# Patient Record
Sex: Female | Born: 1940 | Race: White | Hispanic: No | Smoking: Former smoker
Health system: Southern US, Community
[De-identification: ages and names within clinical notes are randomized; demographics above are authoritative.]

## PROBLEM LIST (undated history)

## (undated) DIAGNOSIS — E7800 Pure hypercholesterolemia, unspecified: Secondary | ICD-10-CM

## (undated) DIAGNOSIS — I509 Heart failure, unspecified: Secondary | ICD-10-CM

## (undated) DIAGNOSIS — F0390 Unspecified dementia without behavioral disturbance: Secondary | ICD-10-CM

## (undated) DIAGNOSIS — F419 Anxiety disorder, unspecified: Secondary | ICD-10-CM

## (undated) DIAGNOSIS — E0590 Thyrotoxicosis, unspecified without thyrotoxic crisis or storm: Secondary | ICD-10-CM

## (undated) DIAGNOSIS — M8580 Other specified disorders of bone density and structure, unspecified site: Secondary | ICD-10-CM

## (undated) DIAGNOSIS — M1990 Unspecified osteoarthritis, unspecified site: Secondary | ICD-10-CM

## (undated) DIAGNOSIS — J449 Chronic obstructive pulmonary disease, unspecified: Secondary | ICD-10-CM

## (undated) DIAGNOSIS — C801 Malignant (primary) neoplasm, unspecified: Secondary | ICD-10-CM

## (undated) HISTORY — DX: Pure hypercholesterolemia, unspecified: E78.00

## (undated) HISTORY — DX: Chronic obstructive pulmonary disease, unspecified: J44.9

## (undated) HISTORY — PX: ABDOMINAL HYSTERECTOMY: SHX81

## (undated) HISTORY — PX: BREAST SURGERY: SHX581

## (undated) HISTORY — PX: BACK SURGERY: SHX140

## (undated) HISTORY — PX: APPENDECTOMY: SHX54

## (undated) HISTORY — DX: Thyrotoxicosis, unspecified without thyrotoxic crisis or storm: E05.90

## (undated) HISTORY — DX: Other specified disorders of bone density and structure, unspecified site: M85.80

---

## 2013-06-07 ENCOUNTER — Ambulatory Visit: Payer: Medicare Other | Attending: Family Medicine

## 2013-06-07 DIAGNOSIS — IMO0001 Reserved for inherently not codable concepts without codable children: Secondary | ICD-10-CM | POA: Insufficient documentation

## 2013-06-07 DIAGNOSIS — I509 Heart failure, unspecified: Secondary | ICD-10-CM | POA: Insufficient documentation

## 2013-06-07 DIAGNOSIS — R262 Difficulty in walking, not elsewhere classified: Secondary | ICD-10-CM | POA: Insufficient documentation

## 2013-06-07 DIAGNOSIS — Z853 Personal history of malignant neoplasm of breast: Secondary | ICD-10-CM | POA: Insufficient documentation

## 2013-06-07 DIAGNOSIS — M216X9 Other acquired deformities of unspecified foot: Secondary | ICD-10-CM | POA: Insufficient documentation

## 2013-06-07 DIAGNOSIS — M6281 Muscle weakness (generalized): Secondary | ICD-10-CM | POA: Insufficient documentation

## 2013-06-07 DIAGNOSIS — M899 Disorder of bone, unspecified: Secondary | ICD-10-CM | POA: Insufficient documentation

## 2013-06-07 DIAGNOSIS — I10 Essential (primary) hypertension: Secondary | ICD-10-CM | POA: Insufficient documentation

## 2013-06-07 DIAGNOSIS — M949 Disorder of cartilage, unspecified: Secondary | ICD-10-CM

## 2013-06-14 ENCOUNTER — Ambulatory Visit: Payer: Medicare Other | Admitting: Physical Therapy

## 2013-06-16 ENCOUNTER — Encounter: Payer: Medicare Other | Admitting: Physical Therapy

## 2013-06-21 ENCOUNTER — Encounter: Payer: Medicare Other | Admitting: Physical Therapy

## 2013-06-21 ENCOUNTER — Ambulatory Visit: Payer: Medicare Other | Admitting: Physical Therapy

## 2013-06-23 ENCOUNTER — Encounter: Payer: Medicare Other | Admitting: Physical Therapy

## 2013-06-27 ENCOUNTER — Ambulatory Visit: Payer: Medicare Other

## 2013-06-28 ENCOUNTER — Encounter: Payer: Medicare Other | Admitting: Physical Therapy

## 2013-06-30 ENCOUNTER — Encounter: Payer: Medicare Other | Admitting: Physical Therapy

## 2013-07-05 ENCOUNTER — Encounter: Payer: Medicare Other | Admitting: Physical Therapy

## 2013-07-06 ENCOUNTER — Ambulatory Visit: Payer: Medicare Other | Admitting: Physical Therapy

## 2013-07-07 ENCOUNTER — Encounter: Payer: Medicare Other | Admitting: Physical Therapy

## 2013-07-07 ENCOUNTER — Encounter (HOSPITAL_COMMUNITY): Payer: Self-pay | Admitting: Emergency Medicine

## 2013-07-07 ENCOUNTER — Inpatient Hospital Stay (HOSPITAL_COMMUNITY)
Admission: EM | Admit: 2013-07-07 | Discharge: 2013-07-14 | DRG: 682 | Disposition: A | Discharge status: Home Health | Payer: Medicare Other | Attending: Family Medicine | Admitting: Family Medicine

## 2013-07-07 DIAGNOSIS — I959 Hypotension, unspecified: Secondary | ICD-10-CM | POA: Diagnosis present

## 2013-07-07 DIAGNOSIS — F0390 Unspecified dementia without behavioral disturbance: Secondary | ICD-10-CM | POA: Diagnosis present

## 2013-07-07 DIAGNOSIS — R197 Diarrhea, unspecified: Secondary | ICD-10-CM

## 2013-07-07 DIAGNOSIS — Z79899 Other long term (current) drug therapy: Secondary | ICD-10-CM

## 2013-07-07 DIAGNOSIS — Z9221 Personal history of antineoplastic chemotherapy: Secondary | ICD-10-CM

## 2013-07-07 DIAGNOSIS — I2699 Other pulmonary embolism without acute cor pulmonale: Secondary | ICD-10-CM

## 2013-07-07 DIAGNOSIS — N19 Unspecified kidney failure: Secondary | ICD-10-CM | POA: Insufficient documentation

## 2013-07-07 DIAGNOSIS — E872 Acidosis, unspecified: Secondary | ICD-10-CM

## 2013-07-07 DIAGNOSIS — J189 Pneumonia, unspecified organism: Secondary | ICD-10-CM | POA: Diagnosis not present

## 2013-07-07 DIAGNOSIS — M545 Low back pain, unspecified: Secondary | ICD-10-CM | POA: Diagnosis present

## 2013-07-07 DIAGNOSIS — G8929 Other chronic pain: Secondary | ICD-10-CM | POA: Diagnosis present

## 2013-07-07 DIAGNOSIS — N179 Acute kidney failure, unspecified: Principal | ICD-10-CM

## 2013-07-07 DIAGNOSIS — H532 Diplopia: Secondary | ICD-10-CM | POA: Diagnosis present

## 2013-07-07 DIAGNOSIS — E876 Hypokalemia: Secondary | ICD-10-CM

## 2013-07-07 DIAGNOSIS — D649 Anemia, unspecified: Secondary | ICD-10-CM

## 2013-07-07 DIAGNOSIS — Z87891 Personal history of nicotine dependence: Secondary | ICD-10-CM

## 2013-07-07 DIAGNOSIS — Z853 Personal history of malignant neoplasm of breast: Secondary | ICD-10-CM

## 2013-07-07 DIAGNOSIS — E860 Dehydration: Secondary | ICD-10-CM | POA: Diagnosis present

## 2013-07-07 DIAGNOSIS — I509 Heart failure, unspecified: Secondary | ICD-10-CM

## 2013-07-07 DIAGNOSIS — Z923 Personal history of irradiation: Secondary | ICD-10-CM

## 2013-07-07 DIAGNOSIS — J9600 Acute respiratory failure, unspecified whether with hypoxia or hypercapnia: Secondary | ICD-10-CM | POA: Diagnosis not present

## 2013-07-07 HISTORY — DX: Heart failure, unspecified: I50.9

## 2013-07-07 HISTORY — DX: Unspecified osteoarthritis, unspecified site: M19.90

## 2013-07-07 HISTORY — DX: Malignant (primary) neoplasm, unspecified: C80.1

## 2013-07-07 LAB — BASIC METABOLIC PANEL
BUN: 61 mg/dL — AB (ref 6–23)
CHLORIDE: 91 meq/L — AB (ref 96–112)
CO2: 17 meq/L — AB (ref 19–32)
Calcium: 8.8 mg/dL (ref 8.4–10.5)
Creatinine, Ser: 6.11 mg/dL — ABNORMAL HIGH (ref 0.50–1.10)
GFR calc Af Amer: 7 mL/min — ABNORMAL LOW (ref >90)
GFR calc non Af Amer: 6 mL/min — ABNORMAL LOW (ref >90)
GLUCOSE: 137 mg/dL — AB (ref 70–99)
POTASSIUM: 2.8 meq/L — AB (ref 3.7–5.3)
SODIUM: 134 meq/L — AB (ref 137–147)

## 2013-07-07 LAB — CBC
HEMATOCRIT: 32.7 % — AB (ref 36.0–46.0)
HEMOGLOBIN: 11.2 g/dL — AB (ref 12.0–15.0)
MCH: 29.2 pg (ref 26.0–34.0)
MCHC: 34.3 g/dL (ref 30.0–36.0)
MCV: 85.4 fL (ref 78.0–100.0)
Platelets: 325 10*3/uL (ref 150–400)
RBC: 3.83 MIL/uL — AB (ref 3.87–5.11)
RDW: 13.6 % (ref 11.5–15.5)
WBC: 9.4 10*3/uL (ref 4.0–10.5)

## 2013-07-07 LAB — POCT I-STAT, CHEM 8
BUN: 61 mg/dL — AB (ref 6–23)
CALCIUM ION: 1 mmol/L — AB (ref 1.13–1.30)
CREATININE: 6.3 mg/dL — AB (ref 0.50–1.10)
Chloride: 96 mEq/L (ref 96–112)
GLUCOSE: 126 mg/dL — AB (ref 70–99)
HCT: 35 % — ABNORMAL LOW (ref 36.0–46.0)
HEMOGLOBIN: 11.9 g/dL — AB (ref 12.0–15.0)
Potassium: 2.6 mEq/L — CL (ref 3.7–5.3)
Sodium: 132 mEq/L — ABNORMAL LOW (ref 137–147)
TCO2: 18 mmol/L (ref 0–100)

## 2013-07-07 MED ORDER — SODIUM CHLORIDE 0.9 % IV BOLUS (SEPSIS)
500.0000 mL | Freq: Once | INTRAVENOUS | Status: AC
  Administered 2013-07-07: 500 mL via INTRAVENOUS

## 2013-07-07 NOTE — ED Notes (Addendum)
Pt went to Dr. Today due to low BP at back specialist office. Pt reports Dr. Alroy Dust with [facility] called her and told her she needed to be admitted due to renal lab results. Dr. Orland Mustard of [facility] is her PCP. Pt was told labs were indicative of renal failure and had to be admitted to hospital. Pt is poor historian and just moved to [place]. Pt's tongue is dry but reports she has been eatting and drinking normally except today. Pt reports some lightheadedness.

## 2013-07-07 NOTE — ED Notes (Signed)
bp low. EDP [place] aware.

## 2013-07-07 NOTE — ED Provider Notes (Signed)
CSN: [id number]     Arrival date & time 07/07/13  2100 History   First MD Initiated Contact with Patient 07/07/13 2137     Chief Complaint  Patient presents with  . Hypotension     (Consider location/radiation/quality/duration/timing/severity/associated sxs/prior Treatment) Patient is a 73 y.o. female presenting with general illness.  Illness Quality:  Acute renal failure Severity:  Severe Onset quality:  Unable to specify Timing:  Constant Progression:  Unchanged Chronicity:  New Context:  73 yo female presenting after her PCPs office detected acute renal failure on blood tests performed today. Relieved by:  Nothing Associated symptoms: diarrhea (daily loose stools, chronic)   Associated symptoms: no abdominal pain, no chest pain, no congestion, no cough, no fever, no nausea, no shortness of breath and no vomiting   Associated symptoms comment:  Mild dizziness   Past Medical History  Diagnosis Date  . Cancer     breast CA 2002  . CHF (congestive heart failure)   . Arthritis    No past surgical history on file. No family history on file. History  Substance Use Topics  . Smoking status: Not on file  . Smokeless tobacco: Not on file  . Alcohol Use: Not on file   OB History   Grav Para Term Preterm Abortions TAB SAB Ect Mult Living                 Review of Systems  Constitutional: Negative for fever.  HENT: Negative for congestion.   Respiratory: Negative for cough and shortness of breath.   Cardiovascular: Negative for chest pain.  Gastrointestinal: Positive for diarrhea (daily loose stools, chronic). Negative for nausea, vomiting and abdominal pain.  All other systems reviewed and are negative.      Allergies  Review of patient's allergies indicates no known allergies.  Home Medications   Current Outpatient Rx  Name  Route  Sig  Dispense  Refill  . Calcium Citrate-Vitamin D (CALCITRATE/VITAMIN D PO)   Oral   Take 5 mLs by mouth daily.         .  carvedilol (COREG) 12.5 MG tablet   Oral   Take 12.5 mg by mouth 2 (two) times daily with a meal.         . furosemide (LASIX) 20 MG tablet   Oral   Take 20 mg by mouth.         . gabapentin (NEURONTIN) 300 MG capsule   Oral   Take 300 mg by mouth 3 (three) times daily.         Marland Kitchen lisinopril (PRINIVIL,ZESTRIL) 20 MG tablet   Oral   Take 20 mg by mouth daily.         . Multiple Vitamin (MULTIVITAMIN WITH MINERALS) TABS tablet   Oral   Take 2 tablets by mouth 3 (three) times daily.         . simvastatin (ZOCOR) 40 MG tablet   Oral   Take 40 mg by mouth daily at 6 PM.         . venlafaxine XR (EFFEXOR-XR) 150 MG 24 hr capsule   Oral   Take 150 mg by mouth daily with breakfast.          BP 74/40  Pulse 98  Temp(Src) 97.9 F (36.6 C) (Oral)  Resp 22  Ht 5' (1.524 m)  Wt 127 lb (57.607 kg)  BMI 24.80 kg/m2  SpO2 96% Physical Exam  Nursing note and vitals reviewed. Constitutional: She is oriented  to person, place, and time. She appears well-developed and well-nourished. No distress.  HENT:  Head: Normocephalic and atraumatic.  Mouth/Throat: Mucous membranes are dry.  Eyes: Conjunctivae are normal. Pupils are equal, round, and reactive to light. No scleral icterus.  Neck: Neck supple.  Cardiovascular: Normal rate, regular rhythm, normal heart sounds and intact distal pulses.   No murmur heard. Pulmonary/Chest: Effort normal. No stridor. No respiratory distress. She has rales (mild bibasilar).  Abdominal: Soft. Bowel sounds are normal. She exhibits no distension. There is no tenderness. There is no rebound and no guarding.  Musculoskeletal: Normal range of motion. She exhibits no edema.  Neurological: She is alert and oriented to person, place, and time.  Skin: Skin is warm and dry. No rash noted.  Psychiatric: She has a normal mood and affect. Her behavior is normal.    ED Course  CRITICAL CARE Performed by: Serita Grit DAVID III Authorized by:  Serita Grit DAVID III Total critical care time: 35 minutes Critical care time was exclusive of separately billable procedures and treating other patients. Critical care was necessary to treat or prevent imminent or life-threatening deterioration of the following conditions: circulatory failure and metabolic crisis. Critical care was time spent personally by me on the following activities: development of treatment plan with patient or surrogate, discussions with consultants, evaluation of patient's response to treatment, examination of patient, obtaining history from patient or surrogate, ordering and performing treatments and interventions, ordering and review of laboratory studies, ordering and review of radiographic studies, pulse oximetry, re-evaluation of patient's condition and review of old charts.   (including critical care time) Labs Review Labs Reviewed  BASIC METABOLIC PANEL - Abnormal; Notable for the following:    Sodium 134 (*)    Potassium 2.8 (*)    Chloride 91 (*)    CO2 17 (*)    Glucose, Bld 137 (*)    BUN 61 (*)    Creatinine, Ser 6.11 (*)    GFR calc non Af Amer 6 (*)    GFR calc Af Amer 7 (*)    All other components within normal limits  CBC - Abnormal; Notable for the following:    RBC 3.83 (*)    Hemoglobin 11.2 (*)    HCT 32.7 (*)    All other components within normal limits  POCT I-STAT, CHEM 8 - Abnormal; Notable for the following:    Sodium 132 (*)    Potassium 2.6 (*)    BUN 61 (*)    Creatinine, Ser 6.30 (*)    Glucose, Bld 126 (*)    Calcium, Ion 1.00 (*)    Hemoglobin 11.9 (*)    HCT 35.0 (*)    All other components within normal limits  URINALYSIS, ROUTINE W REFLEX MICROSCOPIC   Imaging Review No results found.  EKG Interpretation    Date/Time:  Thursday July 07 2013 21:49:08 EST Ventricular Rate:  95 PR Interval:  138 QRS Duration: 106 QT Interval:  463 QTC Calculation: 582 R Axis:   84 Text Interpretation:  Sinus rhythm  Borderline right axis deviation Prolonged QT interval No old tracing to compare Confirmed by [facility]  MD, TREY ([id number]) on 07/07/2013 10:55:51 PM            MDM   Final diagnoses:  Acute renal failure  Hypokalemia    73 yo female presenting with acute renal failure diagnosed at her PCP's office today.  She reports going to see her orthopedist, who sent her to her  PCP due to low blood pressure.  Her PCP's office initially discharged her home, but called her when her bloodwork indicated renal failure.  On arrival, she was in no distress, was nontoxic, was mentating well, but had SBPs in the 80's.  Her son reports that she has chronically low BPs, but we have no records to confirm this.   She denies any significant symptoms recently to explain her renal failure.   Have spoken with Dr. Hal Hope about admission.  Meanwhile, her BPs fell to the 70's.  Will give more IV fluids and evaluate for response.    Recheck of BP showed pressures in 80's, similar to her reported baseline.  Plan admission to stepdown unit.    Houston Siren III, MD 07/08/13 [phone]

## 2013-07-07 NOTE — ED Notes (Signed)
Follow up to EDP - BP is pts. Baseline.

## 2013-07-08 ENCOUNTER — Inpatient Hospital Stay (HOSPITAL_COMMUNITY): Payer: Medicare Other

## 2013-07-08 ENCOUNTER — Encounter (HOSPITAL_COMMUNITY): Payer: Self-pay | Admitting: Internal Medicine

## 2013-07-08 DIAGNOSIS — E876 Hypokalemia: Secondary | ICD-10-CM | POA: Diagnosis present

## 2013-07-08 DIAGNOSIS — R197 Diarrhea, unspecified: Secondary | ICD-10-CM

## 2013-07-08 DIAGNOSIS — N179 Acute kidney failure, unspecified: Secondary | ICD-10-CM | POA: Diagnosis present

## 2013-07-08 DIAGNOSIS — Z853 Personal history of malignant neoplasm of breast: Secondary | ICD-10-CM

## 2013-07-08 DIAGNOSIS — I369 Nonrheumatic tricuspid valve disorder, unspecified: Secondary | ICD-10-CM

## 2013-07-08 DIAGNOSIS — I509 Heart failure, unspecified: Secondary | ICD-10-CM | POA: Diagnosis present

## 2013-07-08 DIAGNOSIS — E872 Acidosis, unspecified: Secondary | ICD-10-CM | POA: Diagnosis present

## 2013-07-08 DIAGNOSIS — D649 Anemia, unspecified: Secondary | ICD-10-CM

## 2013-07-08 LAB — URINALYSIS, ROUTINE W REFLEX MICROSCOPIC
Bilirubin Urine: NEGATIVE
GLUCOSE, UA: NEGATIVE mg/dL
HGB URINE DIPSTICK: NEGATIVE
KETONES UR: 15 mg/dL — AB
LEUKOCYTES UA: NEGATIVE
Nitrite: NEGATIVE
PROTEIN: 30 mg/dL — AB
Specific Gravity, Urine: 1.023 (ref 1.005–1.030)
Urobilinogen, UA: 0.2 mg/dL (ref 0.0–1.0)
pH: 5 (ref 5.0–8.0)

## 2013-07-08 LAB — POCT I-STAT 3, ART BLOOD GAS (G3+)
ACID-BASE DEFICIT: 7 mmol/L — AB (ref 0.0–2.0)
Bicarbonate: 18.7 mEq/L — ABNORMAL LOW (ref 20.0–24.0)
O2 Saturation: 89 %
PH ART: 7.296 — AB (ref 7.350–7.450)
PO2 ART: 60 mmHg — AB (ref 80.0–100.0)
Patient temperature: 97.9
TCO2: 20 mmol/L (ref 0–100)
pCO2 arterial: 38.1 mmHg (ref 35.0–45.0)

## 2013-07-08 LAB — COMPREHENSIVE METABOLIC PANEL
ALK PHOS: 90 U/L (ref 39–117)
ALT: 10 U/L (ref 0–35)
AST: 13 U/L (ref 0–37)
Albumin: 3 g/dL — ABNORMAL LOW (ref 3.5–5.2)
BUN: 53 mg/dL — ABNORMAL HIGH (ref 6–23)
CALCIUM: 7.9 mg/dL — AB (ref 8.4–10.5)
CHLORIDE: 101 meq/L (ref 96–112)
CO2: 19 meq/L (ref 19–32)
Creatinine, Ser: 4.21 mg/dL — ABNORMAL HIGH (ref 0.50–1.10)
GFR calc Af Amer: 11 mL/min — ABNORMAL LOW (ref >90)
GFR, EST NON AFRICAN AMERICAN: 10 mL/min — AB (ref >90)
Glucose, Bld: 120 mg/dL — ABNORMAL HIGH (ref 70–99)
Potassium: 3.1 mEq/L — ABNORMAL LOW (ref 3.7–5.3)
SODIUM: 137 meq/L (ref 137–147)
Total Protein: 6.2 g/dL (ref 6.0–8.3)

## 2013-07-08 LAB — POCT I-STAT, CHEM 8
BUN: 65 mg/dL — ABNORMAL HIGH (ref 6–23)
Calcium, Ion: 1.11 mmol/L — ABNORMAL LOW (ref 1.13–1.30)
Chloride: 96 mEq/L (ref 96–112)
Creatinine, Ser: 6.8 mg/dL — ABNORMAL HIGH (ref 0.50–1.10)
Glucose, Bld: 137 mg/dL — ABNORMAL HIGH (ref 70–99)
HEMATOCRIT: 36 % (ref 36.0–46.0)
HEMOGLOBIN: 12.2 g/dL (ref 12.0–15.0)
POTASSIUM: 2.5 meq/L — AB (ref 3.7–5.3)
Sodium: 133 mEq/L — ABNORMAL LOW (ref 137–147)
TCO2: 21 mmol/L (ref 0–100)

## 2013-07-08 LAB — FERRITIN: Ferritin: 64 ng/mL (ref 10–291)

## 2013-07-08 LAB — LACTIC ACID, PLASMA: Lactic Acid, Venous: 1 mmol/L (ref 0.5–2.2)

## 2013-07-08 LAB — URINE MICROSCOPIC-ADD ON

## 2013-07-08 LAB — CBC WITH DIFFERENTIAL/PLATELET
BASOS PCT: 0 % (ref 0–1)
Basophils Absolute: 0 10*3/uL (ref 0.0–0.1)
EOS ABS: 0.2 10*3/uL (ref 0.0–0.7)
Eosinophils Relative: 2 % (ref 0–5)
HCT: 31.3 % — ABNORMAL LOW (ref 36.0–46.0)
HEMOGLOBIN: 10.5 g/dL — AB (ref 12.0–15.0)
Lymphocytes Relative: 19 % (ref 12–46)
Lymphs Abs: 1.5 10*3/uL (ref 0.7–4.0)
MCH: 28.7 pg (ref 26.0–34.0)
MCHC: 33.5 g/dL (ref 30.0–36.0)
MCV: 85.5 fL (ref 78.0–100.0)
MONOS PCT: 11 % (ref 3–12)
Monocytes Absolute: 0.9 10*3/uL (ref 0.1–1.0)
NEUTROS PCT: 68 % (ref 43–77)
Neutro Abs: 5.5 10*3/uL (ref 1.7–7.7)
Platelets: 275 10*3/uL (ref 150–400)
RBC: 3.66 MIL/uL — ABNORMAL LOW (ref 3.87–5.11)
RDW: 13.4 % (ref 11.5–15.5)
WBC: 8.2 10*3/uL (ref 4.0–10.5)

## 2013-07-08 LAB — IRON AND TIBC
IRON: 34 ug/dL — AB (ref 42–135)
Saturation Ratios: 13 % — ABNORMAL LOW (ref 20–55)
TIBC: 262 ug/dL (ref 250–470)
UIBC: 228 ug/dL (ref 125–400)

## 2013-07-08 LAB — RETICULOCYTES
RBC.: 3.71 MIL/uL — ABNORMAL LOW (ref 3.87–5.11)
RETIC COUNT ABSOLUTE: 22.3 10*3/uL (ref 19.0–186.0)
Retic Ct Pct: 0.6 % (ref 0.4–3.1)

## 2013-07-08 LAB — MRSA PCR SCREENING: MRSA BY PCR: NEGATIVE

## 2013-07-08 LAB — FOLATE

## 2013-07-08 LAB — CLOSTRIDIUM DIFFICILE BY PCR: Toxigenic C. Difficile by PCR: NEGATIVE

## 2013-07-08 LAB — TSH: TSH: 2.612 u[IU]/mL (ref 0.350–4.500)

## 2013-07-08 LAB — CK: Total CK: 82 U/L (ref 7–177)

## 2013-07-08 LAB — TROPONIN I: Troponin I: <0.3 ng/mL (ref <0.30)

## 2013-07-08 LAB — OCCULT BLOOD, POC DEVICE: FECAL OCCULT BLD: NEGATIVE

## 2013-07-08 LAB — VITAMIN B12

## 2013-07-08 MED ORDER — ACETAMINOPHEN 650 MG RE SUPP: 650.0000 mg | Freq: Four times a day (QID) | RECTAL | Status: DC | PRN

## 2013-07-08 MED ORDER — SODIUM CHLORIDE 0.9 % IJ SOLN
3.0000 mL | Freq: Two times a day (BID) | INTRAMUSCULAR | Status: DC
  Administered 2013-07-08 – 2013-07-13 (×10): 3 mL via INTRAVENOUS

## 2013-07-08 MED ORDER — SODIUM CHLORIDE 0.9 % IV SOLN
INTRAVENOUS | Status: DC
Start: 2013-07-08 — End: 2013-07-09
  Administered 2013-07-08: 22:00:00 via INTRAVENOUS

## 2013-07-08 MED ORDER — DIPHENOXYLATE-ATROPINE 2.5-0.025 MG PO TABS
2.0000 | ORAL_TABLET | Freq: Four times a day (QID) | ORAL | Status: DC | PRN
  Administered 2013-07-08: 2 via ORAL
  Filled 2013-07-08: qty 2

## 2013-07-08 MED ORDER — GABAPENTIN 100 MG PO CAPS
100.0000 mg | ORAL_CAPSULE | Freq: Three times a day (TID) | ORAL | Status: DC
  Administered 2013-07-08 – 2013-07-14 (×18): 100 mg via ORAL
  Filled 2013-07-08 (×20): qty 1

## 2013-07-08 MED ORDER — POTASSIUM CHLORIDE CRYS ER 20 MEQ PO TBCR
40.0000 meq | EXTENDED_RELEASE_TABLET | Freq: Two times a day (BID) | ORAL | Status: AC
  Administered 2013-07-08 (×2): 40 meq via ORAL
  Filled 2013-07-08 (×2): qty 2

## 2013-07-08 MED ORDER — ASPIRIN 81 MG PO CHEW
81.0000 mg | CHEWABLE_TABLET | Freq: Every day | ORAL | Status: DC
  Administered 2013-07-08 – 2013-07-11 (×4): 81 mg via ORAL
  Filled 2013-07-08 (×4): qty 1

## 2013-07-08 MED ORDER — ONDANSETRON HCL 4 MG PO TABS: 4.0000 mg | ORAL_TABLET | Freq: Four times a day (QID) | ORAL | Status: DC | PRN

## 2013-07-08 MED ORDER — SODIUM CHLORIDE 0.9 % IV BOLUS (SEPSIS)
500.0000 mL | Freq: Once | INTRAVENOUS | Status: AC
  Administered 2013-07-08: 500 mL via INTRAVENOUS

## 2013-07-08 MED ORDER — VENLAFAXINE HCL ER 150 MG PO CP24
150.0000 mg | ORAL_CAPSULE | Freq: Every day | ORAL | Status: DC
  Administered 2013-07-09 – 2013-07-14 (×6): 150 mg via ORAL
  Filled 2013-07-08 (×8): qty 1

## 2013-07-08 MED ORDER — SODIUM CHLORIDE 0.9 % IV SOLN
INTRAVENOUS | Status: DC
  Administered 2013-07-08 (×3): via INTRAVENOUS

## 2013-07-08 MED ORDER — SODIUM CHLORIDE 0.9 % IV BOLUS (SEPSIS)
1000.0000 mL | Freq: Once | INTRAVENOUS | Status: AC
  Administered 2013-07-08: 1000 mL via INTRAVENOUS

## 2013-07-08 MED ORDER — MAGNESIUM SULFATE IN D5W 10-5 MG/ML-% IV SOLN
1.0000 g | Freq: Once | INTRAVENOUS | Status: AC
  Administered 2013-07-08: 1 g via INTRAVENOUS
  Filled 2013-07-08: qty 100

## 2013-07-08 MED ORDER — POTASSIUM CHLORIDE CRYS ER 20 MEQ PO TBCR
20.0000 meq | EXTENDED_RELEASE_TABLET | Freq: Once | ORAL | Status: AC
  Administered 2013-07-08: 20 meq via ORAL
  Filled 2013-07-08: qty 1

## 2013-07-08 MED ORDER — ACETAMINOPHEN 325 MG PO TABS
650.0000 mg | ORAL_TABLET | Freq: Four times a day (QID) | ORAL | Status: DC | PRN
  Administered 2013-07-08 – 2013-07-12 (×4): 650 mg via ORAL
  Filled 2013-07-08 (×6): qty 2

## 2013-07-08 MED ORDER — ONDANSETRON HCL 4 MG/2ML IJ SOLN: 4.0000 mg | Freq: Four times a day (QID) | INTRAMUSCULAR | Status: DC | PRN

## 2013-07-08 MED ORDER — SODIUM CHLORIDE 0.9 % IV BOLUS (SEPSIS)
1000.0000 mL | INTRAVENOUS | Status: DC | PRN
Start: 2013-07-08 — End: 2013-07-14

## 2013-07-08 MED ORDER — SODIUM CHLORIDE 0.9 % IV SOLN
INTRAVENOUS | Status: DC
  Administered 2013-07-08: 11:00:00 via INTRAVENOUS

## 2013-07-08 MED ORDER — HEPARIN SODIUM (PORCINE) 5000 UNIT/ML IJ SOLN
5000.0000 [IU] | Freq: Three times a day (TID) | INTRAMUSCULAR | Status: DC
  Administered 2013-07-08 – 2013-07-09 (×3): 5000 [IU] via SUBCUTANEOUS
  Filled 2013-07-08 (×6): qty 1

## 2013-07-08 NOTE — Progress Notes (Signed)
  Echocardiogram 2D Echocardiogram has been performed.  Mauricio Po 07/08/2013, 5:43 PM

## 2013-07-08 NOTE — Progress Notes (Signed)
Report obtained on pt at this time.

## 2013-07-08 NOTE — Progress Notes (Signed)
Patient Demographics  [name], is a 73 y.o. female, DOB - 05-15-1941, [id number]  Admit date - 07/07/2013   Admitting Physician [name], MD  Outpatient Primary MD for the patient is [name], MD  LOS - 1   Chief Complaint  Patient presents with  . Hypotension        Assessment & Plan    1. Severe dehydration, acute renal failure along with metabolic acidosis likely secondary to prerenal ischemia caused by combination of Lasix, Coreg and ACE inhibitor. Renal ultrasound is stable, patient's renal function has improved considerably after IV fluids, continue to hold offending medications continue to supplement fluids, monitor renal function and electrolytes closely.    2. Hypotension due to #1 above. Afebrile, no leukocytosis, UA and chest x-ray clear, stable lactic acid level, supportive care as above along with IV fluid boluses if needed.    3. History of breast cancer. No acute issues outpatient monitor.    4. Chronic nonspecific CHF. No echogram in chart. Currently compensated, in fact dehydrated, blood pressure on the softer side, beta blocker ACE inhibitor held, IV fluids.    5. Chr. diarrhea. Rule out C. Difficile.       Code Status: Full  Family Communication:    Disposition Plan: TBD   Procedures Renal US - stable   Consults     Medications  Scheduled Meds: . magnesium sulfate 1 - 4 g bolus IVPB  1 g Intravenous Once  . potassium chloride  40 mEq Oral BID  . sodium chloride  3 mL Intravenous Q12H  . [START ON 07/09/2013] venlafaxine XR  150 mg Oral Q breakfast   Continuous Infusions: . sodium chloride     PRN Meds:.acetaminophen, ondansetron (ZOFRAN) IV, sodium chloride  DVT Prophylaxis   Heparin    Lab Results  Component Value  Date   PLT 275 07/08/2013    Antibiotics     Anti-infectives   None          Subjective:   [name] today has, No headache, No chest pain, No abdominal pain - No Nausea, No new weakness tingling or numbness, No Cough - SOB.   Objective:   Filed Vitals:   07/08/13 0815 07/08/13 0830 07/08/13 0845 07/08/13 0900  BP: 88/50 93/43 88/51  96/50  Pulse: 94 94 92 92  Temp:      TempSrc:      Resp: 12 18 15 14   Height:      Weight:      SpO2: 94% 96% 100% 98%    Wt Readings from Last 3 Encounters:  07/07/13 57.607 kg (127 lb)     Intake/Output Summary (Last 24 hours) at 07/08/13 1115 Last data filed at 07/08/13 0706  Gross per 24 hour  Intake   1500 ml  Output    450 ml  Net   1050 ml     Physical Exam  Awake  Oriented X 1, No new F.N deficits, Normal affect [place].AT,PERRAL Supple Neck,No JVD, No cervical lymphadenopathy appriciated.  Symmetrical Chest wall movement, Good air movement bilaterally, CTAB RRR,No Gallops,Rubs or new Murmurs, No Parasternal Heave +ve B.Sounds, Abd Soft, Non tender, No organomegaly appriciated, No rebound - guarding or rigidity. No Cyanosis, Clubbing or  edema, No new Rash or bruise      Data Review   Micro Results No results found for this or any previous visit (from the past 240 hour(s)).  Radiology Reports US Renal  07/08/2013   CLINICAL DATA:  Acute renal failure.  EXAM: RENAL/URINARY TRACT ULTRASOUND COMPLETE  COMPARISON:  None.  FINDINGS: Right Kidney:  Length: 10.5. Echogenicity within normal limits. No mass or hydronephrosis visualized.  Left Kidney:  Length: 9.7. Echogenicity within normal limits. No mass or hydronephrosis visualized.  Bladder:  The bladder was not visualized.  IMPRESSION: Normal kidneys.   Electronically Signed   By: Rozetta Nunnery M.D.   On: 07/08/2013 06:52   Dg Chest Port 1 View  07/08/2013   CLINICAL DATA:  Low blood pressure, weakness  EXAM: PORTABLE CHEST - 1 VIEW  COMPARISON:  None.  FINDINGS: The  heart size and mediastinal contours are within normal limits. There is no focal infiltrate, pulmonary edema, or pleural effusion. 7 mm calcified granuloma is identified in the medial right upper lobe. The visualized skeletal structures are unremarkable.  IMPRESSION: No active cardiopulmonary disease.   Electronically Signed   By: Abelardo Diesel M.D.   On: 07/08/2013 01:05    CBC  Recent Labs Lab 07/07/13 2132 07/07/13 2153 07/08/13 0521  WBC 9.4  --  8.2  HGB 11.2* 11.9* 10.5*  HCT 32.7* 35.0* 31.3*  PLT 325  --  275  MCV 85.4  --  85.5  MCH 29.2  --  28.7  MCHC 34.3  --  33.5  RDW 13.6  --  13.4  LYMPHSABS  --   --  1.5  MONOABS  --   --  0.9  EOSABS  --   --  0.2  BASOSABS  --   --  0.0    Chemistries   Recent Labs Lab 07/07/13 2153 07/07/13 2220 07/08/13 0521  NA 132* 134* 137  K 2.6* 2.8* 3.1*  CL 96 91* 101  CO2  --  17* 19  GLUCOSE 126* 137* 120*  BUN 61* 61* 53*  CREATININE 6.30* 6.11* 4.21*  CALCIUM  --  8.8 7.9*  AST  --   --  13  ALT  --   --  10  ALKPHOS  --   --  90  BILITOT  --   --  <0.2*   ------------------------------------------------------------------------------------------------------------------ estimated creatinine clearance is 9.6 ml/min (by C-G formula based on Cr of 4.21). ------------------------------------------------------------------------------------------------------------------ No results found for this basename: HGBA1C,  in the last 72 hours ------------------------------------------------------------------------------------------------------------------ No results found for this basename: CHOL, HDL, LDLCALC, TRIG, CHOLHDL, LDLDIRECT,  in the last 72 hours ------------------------------------------------------------------------------------------------------------------ No results found for this basename: TSH, T4TOTAL, FREET3, T3FREE, THYROIDAB,  in the last 72  hours ------------------------------------------------------------------------------------------------------------------ No results found for this basename: VITAMINB12, FOLATE, FERRITIN, TIBC, IRON, RETICCTPCT,  in the last 72 hours  Coagulation profile No results found for this basename: INR, PROTIME,  in the last 168 hours  No results found for this basename: DDIMER,  in the last 72 hours  Cardiac Enzymes  Recent Labs Lab 07/08/13 0203  TROPONINI <0.30   ------------------------------------------------------------------------------------------------------------------ No components found with this basename: POCBNP,      Time Spent in minutes   35   Lala Lund K M.D on 07/08/2013 at 11:15 AM  Between 7am to 7pm - Pager - [phone]  After 7pm go to [email or web address] - password [email or web address]  And look for the night coverage person covering for me after hours  [facility] Office  [phone]

## 2013-07-08 NOTE — ED Notes (Signed)
MD at bedside.  Admitting Dr. At bedside.

## 2013-07-08 NOTE — ED Notes (Signed)
Pt had 2 loose bowel movements

## 2013-07-08 NOTE — ED Notes (Signed)
Admitting MD aware of current status.

## 2013-07-08 NOTE — H&P (Addendum)
[facility] History and Physical  [name] [id number] DOB: 08-Nov-1940 DOA: 07/07/2013  Referring physician: ER physician. PCP: [name], MD   Chief Complaint: Was referred to the ER because of abnormal labs.  HPI: [name] is a 73 y.o. female history of breast cancer status post lumpectomy chemotherapy and radiation in 2002 following which patient had developed chemotherapy related CHF and is on lisinopril and Lasix and Coreg had gone to her orthopedic appointment yesterday and was found to have on record about blood pressure and was advised to go to her PCP where blood test was done and later patient's PCP called patient to go to the ER because her abnormal labs. In the ER labs were done and shows creatinine of 6 with metabolic acidosis and hypokalemia and anemia. Patient also was found to be hypotensive with blood pressure in the 06Y systolic. Patient otherwise denies any chest pain shortness of breath nausea vomiting abdominal pain fever chills or any loss of consciousness dizziness. Patient states that she has been having diarrhea for one time now. She has chronic low back pain has had surgery last year. Denies having taken any new medications recently and has not taken any NSAIDs for pain. Denies having noticed any blood in the stools. Patient was given 500 cc bolus normal saline twice and blood pressure improves with boluses. At this time urine studies and renal sonogram and ABG are pending. Patient will be admitted for further management. Patient states she has not taken her usual dose of Lasix yesterday and has not made much urine since yesterday morning.  Review of Systems: As presented in the history of presenting illness, rest negative.  Past Medical History  Diagnosis Date  . Cancer     breast CA 2002  . CHF (congestive heart failure)   . Arthritis    Past Surgical History  Procedure Laterality Date  . Abdominal hysterectomy    . Appendectomy    . Breast  surgery    . Back surgery     Social History:  reports that she has quit smoking. She does not have any smokeless tobacco history on file. She reports that she does not drink alcohol or use illicit drugs. Where does patient live at assisted living. Can patient participate in ADLs? Yes.  No Known Allergies  Family History:  Family History  Problem Relation Age of Onset  . Stroke Father   . Diabetes Mellitus I Son   . Leukemia Son       Prior to Admission medications   Medication Sig Start Date End Date Taking? Authorizing Provider  Calcium Citrate-Vitamin D (CALCITRATE/VITAMIN D PO) Take 5 mLs by mouth daily.   Yes Historical Provider, MD  carvedilol (COREG) 12.5 MG tablet Take 12.5 mg by mouth 2 (two) times daily with a meal.   Yes Historical Provider, MD  furosemide (LASIX) 20 MG tablet Take 20 mg by mouth.   Yes Historical Provider, MD  gabapentin (NEURONTIN) 300 MG capsule Take 300 mg by mouth 3 (three) times daily.   Yes Historical Provider, MD  lisinopril (PRINIVIL,ZESTRIL) 20 MG tablet Take 20 mg by mouth daily.   Yes Historical Provider, MD  Multiple Vitamin (MULTIVITAMIN WITH MINERALS) TABS tablet Take 2 tablets by mouth 3 (three) times daily.   Yes Historical Provider, MD  simvastatin (ZOCOR) 40 MG tablet Take 40 mg by mouth daily at 6 PM.   Yes Historical Provider, MD  venlafaxine XR (EFFEXOR-XR) 150 MG 24 hr capsule Take 150 mg  by mouth daily with breakfast.   Yes Historical Provider, MD    Physical Exam: Filed Vitals:   07/07/13 2315 07/07/13 2329 07/07/13 2345 07/08/13 0000  BP: 71/38 71/38 86/47  81/41  Pulse: 90 90 93 95  Temp:      TempSrc:      Resp: 20 16 19 17   Height:      Weight:      SpO2: 97% 96% 100% 98%     General:  Well developed and moderately nourished.  Eyes: Pallor present. Anicteric.  ENT: No discharge from the ears eyes nose mouth.  Neck: No mass felt. No JVD.  Cardiovascular: S1-S2 heard.  Respiratory: No rhonchi or  crepitations.  Abdomen: Soft nontender bowel sounds present. No guarding no rigidity.  Skin: No rash.  Musculoskeletal: No edema. Has developed a small bump on the anterior shin after wearing brace.  Psychiatric: Appears normal.  Neurologic: Alert awake oriented to time place and person. Moves all extremities. Has right foot drop.  Labs on Admission:  Basic Metabolic Panel:  Recent Labs Lab 07/07/13 2153 07/07/13 2220  NA 132* 134*  K 2.6* 2.8*  CL 96 91*  CO2  --  17*  GLUCOSE 126* 137*  BUN 61* 61*  CREATININE 6.30* 6.11*  CALCIUM  --  8.8   Liver Function Tests: No results found for this basename: AST, ALT, ALKPHOS, BILITOT, PROT, ALBUMIN,  in the last 168 hours No results found for this basename: LIPASE, AMYLASE,  in the last 168 hours No results found for this basename: AMMONIA,  in the last 168 hours CBC:  Recent Labs Lab 07/07/13 2132 07/07/13 2153  WBC 9.4  --   HGB 11.2* 11.9*  HCT 32.7* 35.0*  MCV 85.4  --   PLT 325  --    Cardiac Enzymes: No results found for this basename: CKTOTAL, CKMB, CKMBINDEX, TROPONINI,  in the last 168 hours  BNP (last 3 results) No results found for this basename: PROBNP,  in the last 8760 hours CBG: No results found for this basename: GLUCAP,  in the last 168 hours  Radiological Exams on Admission: Dg Chest Port 1 View  07/08/2013   CLINICAL DATA:  Low blood pressure, weakness  EXAM: PORTABLE CHEST - 1 VIEW  COMPARISON:  None.  FINDINGS: The heart size and mediastinal contours are within normal limits. There is no focal infiltrate, pulmonary edema, or pleural effusion. 7 mm calcified granuloma is identified in the medial right upper lobe. The visualized skeletal structures are unremarkable.  IMPRESSION: No active cardiopulmonary disease.   Electronically Signed   By: Abelardo Diesel M.D.   On: 07/08/2013 01:05    EKG: Independently reviewed. Normal sinus rhythm with prolonged QTC.  Assessment/Plan Principal Problem:    Acute renal failure Active Problems:   Metabolic acidosis   Hypokalemia   Anemia   History of breast cancer   CHF (congestive heart failure)   Diarrhea   1. Acute renal failure -  Cause at this time is not clear. No old labs to compare. The renal sonogram is pending to check for any obstruction. Urine studies are also pending including urine analysis, urine sodium and creatinine. Since patient has anemia I have also ordered SPEP and UPEP. Check CK and troponins. At this time patient is still hypotensive so I have ordered another liter normal saline bolus. I will hold off Lasix lisinopril and Coreg at this time due to hypotension. Check ABG since patient has metabolic acidosis probably from  renal failure. Closely follow intake output and metabolic panel. Patient is yet to give a sample of urine. Further recommendations based on clinical course and test ordered. Since patient has history of CHF closely follow her respiratory status. Patient does have diarrhea which could be contributing to the renal failure with hypotension further worsening along with patient's antihypertensives. 2. Hypotension - patient does not look septic. For now patient is responding to fluids. I have ordered one more liter normal saline bolus and will place patient on normal saline infusion. Since patient has history of CHF closely follow her respiratory status intake output and metabolic panel. 3. Metabolic acidosis - probably from #1 reason. Check ABG and if very acidotic we'll start bicarbonate infusion. 4. Anemia - check anemia panel, SPEP UPEP and stool for occult blood. Closely follow CBC. 5. Diarrhea - patient states that she's been having diarrhea for last few months. Denies having taken any antibiotics recently. Check stool for culture and C. difficile. Patient abdomen appears benign. Denies any nausea vomiting or abdominal pain. 6. 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 Holding off lisinopril Lasix and Coreg due to hypotension and renal failure. 7. Chronic low back pain - patient states she doesn't take any pain medications. For now I have Patient on Tylenol. LFTs are pending.    Code Status:  full code.  Family Communication:  none.  Disposition Plan:  admit to inpatient.    [name] N. [facility] Pager [phone].  If 7PM-7AM, please contact night-coverage [email or web address] Password [facility] 07/08/2013, 1:38 AM

## 2013-07-09 ENCOUNTER — Other Ambulatory Visit: Payer: Self-pay

## 2013-07-09 ENCOUNTER — Inpatient Hospital Stay (HOSPITAL_COMMUNITY): Payer: Medicare Other

## 2013-07-09 DIAGNOSIS — I2699 Other pulmonary embolism without acute cor pulmonale: Secondary | ICD-10-CM

## 2013-07-09 LAB — MAGNESIUM: Magnesium: 2.3 mg/dL (ref 1.5–2.5)

## 2013-07-09 LAB — CBC
HEMATOCRIT: 29 % — AB (ref 36.0–46.0)
Hemoglobin: 9.9 g/dL — ABNORMAL LOW (ref 12.0–15.0)
MCH: 29.4 pg (ref 26.0–34.0)
MCHC: 34.1 g/dL (ref 30.0–36.0)
MCV: 86.1 fL (ref 78.0–100.0)
Platelets: 244 10*3/uL (ref 150–400)
RBC: 3.37 MIL/uL — AB (ref 3.87–5.11)
RDW: 13.8 % (ref 11.5–15.5)
WBC: 5.3 10*3/uL (ref 4.0–10.5)

## 2013-07-09 LAB — BASIC METABOLIC PANEL
BUN: 35 mg/dL — ABNORMAL HIGH (ref 6–23)
CHLORIDE: 115 meq/L — AB (ref 96–112)
CO2: 16 mEq/L — ABNORMAL LOW (ref 19–32)
CREATININE: 1.07 mg/dL (ref 0.50–1.10)
Calcium: 8 mg/dL — ABNORMAL LOW (ref 8.4–10.5)
GFR, EST AFRICAN AMERICAN: 59 mL/min — AB (ref >90)
GFR, EST NON AFRICAN AMERICAN: 51 mL/min — AB (ref >90)
Glucose, Bld: 85 mg/dL (ref 70–99)
POTASSIUM: 4.1 meq/L (ref 3.7–5.3)
Sodium: 144 mEq/L (ref 137–147)

## 2013-07-09 LAB — TROPONIN I: Troponin I: <0.3 ng/mL (ref <0.30)

## 2013-07-09 MED ORDER — LACTATED RINGERS IV SOLN
INTRAVENOUS | Status: AC
  Administered 2013-07-09: 1000 mL via INTRAVENOUS

## 2013-07-09 MED ORDER — RIVAROXABAN 15 MG PO TABS
15.0000 mg | ORAL_TABLET | Freq: Two times a day (BID) | ORAL | Status: DC
  Administered 2013-07-09 – 2013-07-14 (×11): 15 mg via ORAL
  Filled 2013-07-09 (×14): qty 1

## 2013-07-09 MED ORDER — METOPROLOL TARTRATE 1 MG/ML IV SOLN: 5.0000 mg | INTRAVENOUS | Status: DC | PRN

## 2013-07-09 MED ORDER — NITROGLYCERIN 0.4 MG SL SUBL: 0.4000 mg | SUBLINGUAL_TABLET | SUBLINGUAL | Status: DC | PRN

## 2013-07-09 MED ORDER — NITROGLYCERIN 0.4 MG SL SUBL
SUBLINGUAL_TABLET | SUBLINGUAL | Status: AC
  Filled 2013-07-09: qty 62.5

## 2013-07-09 MED ORDER — RIVAROXABAN 15 MG PO TABS
15.0000 mg | ORAL_TABLET | Freq: Two times a day (BID) | ORAL | Status: DC
Start: 2013-07-09 — End: 2013-07-09
  Filled 2013-07-09 (×2): qty 1

## 2013-07-09 MED ORDER — ACETAMINOPHEN 325 MG PO TABS
650.0000 mg | ORAL_TABLET | Freq: Four times a day (QID) | ORAL | Status: AC
  Administered 2013-07-09 – 2013-07-10 (×4): 650 mg via ORAL
  Filled 2013-07-09 (×3): qty 2

## 2013-07-09 MED ORDER — MORPHINE SULFATE 2 MG/ML IJ SOLN
2.0000 mg | INTRAMUSCULAR | Status: DC | PRN
  Administered 2013-07-09 – 2013-07-10 (×2): 2 mg via INTRAVENOUS
  Filled 2013-07-09 (×2): qty 1

## 2013-07-09 MED ORDER — IOHEXOL 350 MG/ML SOLN
50.0000 mL | Freq: Once | INTRAVENOUS | Status: AC | PRN
  Administered 2013-07-09: 50 mL via INTRAVENOUS

## 2013-07-09 NOTE — Progress Notes (Addendum)
Patient Demographics  [name], is a 73 y.o. female, DOB - 11-23-1940, [id number]  Admit date - 07/07/2013   Admitting Physician [name], MD  Outpatient Primary MD for the patient is Mary Pepper, MD  LOS - 2   Chief Complaint  Patient presents with  . Hypotension        Assessment & Plan    1. Severe dehydration, acute renal failure along with metabolic acidosis likely secondary to prerenal ischemia caused by combination of Lasix, Coreg and ACE inhibitor. Renal ultrasound is stable, acute renal failure resolved after IV fluids for hydration which will be continued at a gentle rate. Monitor SPEP UPEP.    2. Hypotension due to #1 above. Afebrile, no leukocytosis, UA and chest x-ray clear, stable lactic acid level, supportive care as above along with IV fluid boluses if needed.    3. History of breast cancer. No acute issues outpatient monitor.    4. Chronic nonspecific CHF. Echo here shows a preserved EF and no diastolic dysfunction.    5. Chr. diarrhea. Ruled out C. Difficile. Imodium when necessary.     6. Right-sided Pleuritic chest pain on 07/09/2013. Suspicious for PE, CT angiogram done which confirms left lower lobe PE, right-sided evaluation suboptimal due to motion, likely has right-sided PE also, will place on xaralto to be dosed by pharmacy, stop DVT prophylaxis dose heparin. Obtain lower extremity ultrasound. Outpatient followup with hematology post discharge. Likely PE secondary to breast cancer.     7. Hyperchloremia causing mild metabolic acidosis. Change IV fluids to LR and monitor. See #2 as above UA, chest x-ray, lactate normal, afebrile.      8. Diplopia for weeks - MRI brain stable, has been following an ophthalmologist, advised to do so  post DC.         Code Status: Full  Family Communication:    Disposition Plan: TBD   Procedures Renal US - stable, echogram, CT angiogram, lower extremity venous duplex   Consults     Medications  Scheduled Meds: . aspirin  81 mg Oral Daily  . gabapentin  100 mg Oral TID  . nitroGLYCERIN      . sodium chloride  3 mL Intravenous Q12H  . venlafaxine XR  150 mg Oral Q breakfast   Continuous Infusions: . lactated ringers 1,000 mL (07/09/13 0756)   PRN Meds:.acetaminophen, diphenoxylate-atropine, morphine injection, nitroGLYCERIN, ondansetron (ZOFRAN) IV, sodium chloride  DVT Prophylaxis   Heparin    Lab Results  Component Value Date   PLT 244 07/09/2013    Antibiotics     Anti-infectives   None          Subjective:   [name] today has, No headache, No chest pain, No abdominal pain - No Nausea, No new weakness tingling or numbness, No Cough - SOB.   Objective:   Filed Vitals:   07/09/13 0758 07/09/13 0800 07/09/13 0927 07/09/13 0933  BP: 124/79  126/71   Pulse: 104     Temp:  98.4 F (36.9 C)    TempSrc:  Oral    Resp:   24   Height:      Weight:      SpO2: 100%   95%    Wt  Readings from Last 3 Encounters:  07/09/13 57.7 kg (127 lb 3.3 oz)     Intake/Output Summary (Last 24 hours) at 07/09/13 1057 Last data filed at 07/09/13 0900  Gross per 24 hour  Intake   1850 ml  Output    750 ml  Net   1100 ml     Physical Exam  Awake  Oriented X 1, No new F.N deficits, Normal affect .AT,PERRAL Supple Neck,No JVD, No cervical lymphadenopathy appriciated.  Symmetrical Chest wall movement, Good air movement bilaterally, CTAB RRR,No Gallops,Rubs or new Murmurs, No Parasternal Heave +ve B.Sounds, Abd Soft, Non tender, No organomegaly appriciated, No rebound - guarding or rigidity. No Cyanosis, Clubbing or edema, No new Rash or bruise      Data Review   Micro Results Recent Results (from the past 240 hour(s))  CLOSTRIDIUM DIFFICILE  BY PCR     Status: None   Collection Time    07/08/13  9:53 AM      Result Value Ref Range Status   C difficile by pcr NEGATIVE  NEGATIVE Final  MRSA PCR SCREENING     Status: None   Collection Time    07/08/13 12:13 PM      Result Value Ref Range Status   MRSA by PCR NEGATIVE  NEGATIVE Final   Comment:            The GeneXpert MRSA Assay (FDA     approved for NASAL specimens     only), is one component of a     comprehensive MRSA colonization     surveillance program. It is not     intended to diagnose MRSA     infection nor to guide or     monitor treatment for     MRSA infections.    Radiology Reports US Renal  07/08/2013   CLINICAL DATA:  Acute renal failure.  EXAM: RENAL/URINARY TRACT ULTRASOUND COMPLETE  COMPARISON:  None.  FINDINGS: Right Kidney:  Length: 10.5. Echogenicity within normal limits. No mass or hydronephrosis visualized.  Left Kidney:  Length: 9.7. Echogenicity within normal limits. No mass or hydronephrosis visualized.  Bladder:  The bladder was not visualized.  IMPRESSION: Normal kidneys.   Electronically Signed   By: Rozetta Nunnery M.D.   On: 07/08/2013 06:52   Dg Chest Port 1 View  07/08/2013   CLINICAL DATA:  Low blood pressure, weakness  EXAM: PORTABLE CHEST - 1 VIEW  COMPARISON:  None.  FINDINGS: The heart size and mediastinal contours are within normal limits. There is no focal infiltrate, pulmonary edema, or pleural effusion. 7 mm calcified granuloma is identified in the medial right upper lobe. The visualized skeletal structures are unremarkable.  IMPRESSION: No active cardiopulmonary disease.   Electronically Signed   By: Abelardo Diesel M.D.   On: 07/08/2013 01:05    CBC  Recent Labs Lab 07/07/13 2132 07/07/13 2153 07/07/13 2229 07/08/13 0521 07/09/13 0332  WBC 9.4  --   --  8.2 5.3  HGB 11.2* 11.9* 12.2 10.5* 9.9*  HCT 32.7* 35.0* 36.0 31.3* 29.0*  PLT 325  --   --  275 244  MCV 85.4  --   --  85.5 86.1  MCH 29.2  --   --  28.7 29.4  MCHC 34.3   --   --  33.5 34.1  RDW 13.6  --   --  13.4 13.8  LYMPHSABS  --   --   --  1.5  --   MONOABS  --   --   --  0.9  --   EOSABS  --   --   --  0.2  --   BASOSABS  --   --   --  0.0  --     Chemistries   Recent Labs Lab 07/07/13 2153 07/07/13 2220 07/07/13 2229 07/08/13 0521 07/09/13 0332  NA 132* 134* 133* 137 144  K 2.6* 2.8* 2.5* 3.1* 4.1  CL 96 91* 96 101 115*  CO2  --  17*  --  19 16*  GLUCOSE 126* 137* 137* 120* 85  BUN 61* 61* 65* 53* 35*  CREATININE 6.30* 6.11* 6.80* 4.21* 1.07  CALCIUM  --  8.8  --  7.9* 8.0*  MG  --   --   --   --  2.3  AST  --   --   --  13  --   ALT  --   --   --  10  --   ALKPHOS  --   --   --  90  --   BILITOT  --   --   --  <0.2*  --    ------------------------------------------------------------------------------------------------------------------ estimated creatinine clearance is 37.8 ml/min (by C-G formula based on Cr of 1.07). ------------------------------------------------------------------------------------------------------------------ No results found for this basename: HGBA1C,  in the last 72 hours ------------------------------------------------------------------------------------------------------------------ No results found for this basename: CHOL, HDL, LDLCALC, TRIG, CHOLHDL, LDLDIRECT,  in the last 72 hours ------------------------------------------------------------------------------------------------------------------  Recent Labs  07/08/13 1241  TSH 2.612   ------------------------------------------------------------------------------------------------------------------  Recent Labs  07/08/13 1241  VITAMINB12 >2000*  FOLATE >20.0  FERRITIN 64  TIBC 262  IRON 34*  RETICCTPCT 0.6    Coagulation profile No results found for this basename: INR, PROTIME,  in the last 168 hours  No results found for this basename: DDIMER,  in the last 72 hours  Cardiac Enzymes  Recent Labs Lab 07/08/13 0203  TROPONINI <0.30     ------------------------------------------------------------------------------------------------------------------ No components found with this basename: POCBNP,      Time Spent in minutes   35   Lala Lund K M.D on 07/09/2013 at 10:57 AM  Between 7am to 7pm - Pager - [phone]  After 7pm go to [email or web address] - password [email or web address]  And look for the night coverage person covering for me after hours  [facility] Office  [phone]

## 2013-07-09 NOTE — Progress Notes (Signed)
ANTICOAGULATION CONSULT NOTE - Initial Consult  Pharmacy Consult for Xarelto Indication: pulmonary embolus  No Known Allergies  Patient Measurements: Height: 5' (152.Mary cm) Weight: 127 lb 3.3 oz (57.7 kg) IBW/kg (Calculated) : 45.5  Vital Signs: Temp: 98.[name] (36.9 C) (02/14 0800) Temp src: Oral (02/14 0800) BP: 126/71 mmHg (02/14 0927) Pulse Rate: 104 (02/14 0758)  Labs:  Recent Labs  07/07/13 2132  07/07/13 2229 07/08/13 0203 07/08/13 0521 07/09/13 0332  HGB 11.2*  < > 12.2  --  10.5* 9.9*  HCT 32.7*  < > 36.0  --  31.3* 29.0*  PLT 325  --   --   --  275 244  CREATININE  --   < > 6.80*  --  Mary.21* 1.07  CKTOTAL  --   --   --  82  --   --   TROPONINI  --   --   --  <0.30  --   --   < > = values in this interval not displayed.  Estimated Creatinine Clearance: 37.8 ml/min (by C-G formula based on Cr of 1.07).   Medical History: Past Medical History  Diagnosis Date  . Cancer     breast CA 2002  . CHF (congestive heart failure)   . Arthritis     Medications:  Prescriptions prior to admission  Medication Sig Dispense Refill  . Calcium Citrate-Vitamin D (CALCITRATE/VITAMIN D PO) Take 5 mLs by mouth daily.      . carvedilol (COREG) 12.5 MG tablet Take 12.5 mg by mouth 2 (two) times daily with a meal.      . furosemide (LASIX) 20 MG tablet Take 20 mg by mouth.      . gabapentin (NEURONTIN) 300 MG capsule Take 300 mg by mouth 3 (three) times daily.      Marland Kitchen lisinopril (PRINIVIL,ZESTRIL) 20 MG tablet Take 20 mg by mouth daily.      . Multiple Vitamin (MULTIVITAMIN WITH MINERALS) TABS tablet Take 2 tablets by mouth 3 (three) times daily.      . simvastatin (ZOCOR) 40 MG tablet Take 40 mg by mouth daily at 6 PM.      . venlafaxine XR (EFFEXOR-XR) 150 MG 24 hr capsule Take 150 mg by mouth daily with breakfast.       Scheduled:  . aspirin  81 mg Oral Daily  . gabapentin  100 mg Oral TID  . nitroGLYCERIN      . sodium chloride  3 mL Intravenous Q12H  . venlafaxine XR   150 mg Oral Q breakfast    Assessment: 73 yo Vaughn with h/o breast cancer referred to ED on 2/13 for AKI with metabolic acidosis, hyperkalemia and anemia. CT confirms LLL PE, now to start Xarelto. Of note, H/H low with no reported s/s bleeding. FOBT negative, iron studies low. AKI resolving, SCr now 1.07 (CrCl ~38 ml/min).   Goal of Therapy:  Treatment of PE  Plan:  - Start Xarelto 15 mg PO BID with meals x 21 days (last dose on 3/6), followed by 20 mg PO daily with a meal (to start on 3/7) - Monitor renal function, CBC, s/s bleeding  Harolyn Rutherford, PharmD Clinical Pharmacist - Resident Pager: [phone] Pharmacy: [phone] 07/09/2013 11:37 AM

## 2013-07-09 NOTE — Progress Notes (Signed)
VASCULAR LAB PRELIMINARY  PRELIMINARY  PRELIMINARY  PRELIMINARY  Bilateral lower extremity Dopplers completed.    Preliminary report:  There is no DVT or SVT noted in the bilateral lower extremities.  Chabeli Barsamian, RVT 07/09/2013, 7:04 PM

## 2013-07-09 NOTE — Plan of Care (Signed)
Problem: Phase I Progression Outcomes Goal: Dyspnea controlled at rest Outcome: Not Progressing MD aware

## 2013-07-09 NOTE — Progress Notes (Signed)
Nurse contacted MD to inform  that pt was still experiencing chest pain as seen during MD assessment during 0700 hour.  MD ordered nurse to perform Stat EKG,. administer nitroglycerin as ordered and to administer PRN morphine is pain continues.  Stat EKG, result communicated with MD, pt refused Nitro pill (also communicated to MD), morphine administered.  Nurse will continue to monitor

## 2013-07-09 NOTE — Progress Notes (Signed)
Nurse reassessed pt; pt continues to complain about chest and lower back pain post PRN morphine administration during 0900 hr.  Nurse contacted MD, MD acknowledged nurse concerns and instructed nurse to order Tylenol every 6 hours x 4, Lopressor 5mg  IV q 4 PRN for HR >110, continue 2 liters [place] supplemental oxygen to help with chest pain.  Nurse will carry out orders as instructed and will continue monitor.

## 2013-07-10 ENCOUNTER — Inpatient Hospital Stay (HOSPITAL_COMMUNITY): Payer: Medicare Other

## 2013-07-10 LAB — BASIC METABOLIC PANEL
BUN: 18 mg/dL (ref 6–23)
CALCIUM: 8.4 mg/dL (ref 8.4–10.5)
CO2: 19 mEq/L (ref 19–32)
Chloride: 110 mEq/L (ref 96–112)
Creatinine, Ser: 0.8 mg/dL (ref 0.50–1.10)
GFR, EST AFRICAN AMERICAN: 83 mL/min — AB (ref >90)
GFR, EST NON AFRICAN AMERICAN: 72 mL/min — AB (ref >90)
Glucose, Bld: 114 mg/dL — ABNORMAL HIGH (ref 70–99)
Potassium: 3.8 mEq/L (ref 3.7–5.3)
SODIUM: 141 meq/L (ref 137–147)

## 2013-07-10 LAB — PRO B NATRIURETIC PEPTIDE: Pro B Natriuretic peptide (BNP): 5930 pg/mL — ABNORMAL HIGH (ref 0–125)

## 2013-07-10 MED ORDER — VANCOMYCIN HCL IN DEXTROSE 1-5 GM/200ML-% IV SOLN
1000.0000 mg | Freq: Once | INTRAVENOUS | Status: AC
  Administered 2013-07-10: 1000 mg via INTRAVENOUS
  Filled 2013-07-10: qty 200

## 2013-07-10 MED ORDER — METOPROLOL TARTRATE 25 MG PO TABS
25.0000 mg | ORAL_TABLET | Freq: Two times a day (BID) | ORAL | Status: DC
Start: 2013-07-10 — End: 2013-07-10
  Administered 2013-07-10: 25 mg via ORAL
  Filled 2013-07-10 (×2): qty 1

## 2013-07-10 MED ORDER — FUROSEMIDE 10 MG/ML IJ SOLN
40.0000 mg | Freq: Once | INTRAMUSCULAR | Status: AC
  Administered 2013-07-10: 40 mg via INTRAVENOUS
  Filled 2013-07-10: qty 4

## 2013-07-10 MED ORDER — GUAIFENESIN ER 600 MG PO TB12
600.0000 mg | ORAL_TABLET | Freq: Two times a day (BID) | ORAL | Status: DC
  Administered 2013-07-10 – 2013-07-14 (×8): 600 mg via ORAL
  Filled 2013-07-10 (×10): qty 1

## 2013-07-10 MED ORDER — DEXTROSE 5 % IV SOLN
2.0000 g | Freq: Two times a day (BID) | INTRAVENOUS | Status: DC
  Administered 2013-07-10 – 2013-07-11 (×3): 2 g via INTRAVENOUS
  Filled 2013-07-10 (×6): qty 2

## 2013-07-10 MED ORDER — IBUPROFEN 600 MG PO TABS
600.0000 mg | ORAL_TABLET | Freq: Four times a day (QID) | ORAL | Status: DC | PRN
  Filled 2013-07-10: qty 1

## 2013-07-10 MED ORDER — METHYLPREDNISOLONE SODIUM SUCC 125 MG IJ SOLR
80.0000 mg | Freq: Three times a day (TID) | INTRAMUSCULAR | Status: DC
  Administered 2013-07-10 (×2): 80 mg via INTRAVENOUS
  Administered 2013-07-11: 11:00:00 via INTRAVENOUS
  Administered 2013-07-11: 80 mg via INTRAVENOUS
  Filled 2013-07-10 (×9): qty 1.28

## 2013-07-10 MED ORDER — LEVOFLOXACIN IN D5W 750 MG/150ML IV SOLN
750.0000 mg | INTRAVENOUS | Status: DC
  Administered 2013-07-10 – 2013-07-11 (×2): 750 mg via INTRAVENOUS
  Filled 2013-07-10 (×4): qty 150

## 2013-07-10 MED ORDER — LEVALBUTEROL HCL 0.63 MG/3ML IN NEBU
0.6300 mg | INHALATION_SOLUTION | Freq: Four times a day (QID) | RESPIRATORY_TRACT | Status: DC
  Administered 2013-07-10 – 2013-07-14 (×15): 0.63 mg via RESPIRATORY_TRACT
  Filled 2013-07-10 (×31): qty 3

## 2013-07-10 MED ORDER — SODIUM CHLORIDE 0.9 % IV SOLN
500.0000 mg | Freq: Two times a day (BID) | INTRAVENOUS | Status: DC
  Administered 2013-07-11 – 2013-07-12 (×3): 500 mg via INTRAVENOUS
  Filled 2013-07-10 (×5): qty 500

## 2013-07-10 MED ORDER — ALBUTEROL SULFATE (2.5 MG/3ML) 0.083% IN NEBU
2.5000 mg | INHALATION_SOLUTION | RESPIRATORY_TRACT | Status: DC | PRN
  Administered 2013-07-10: 2.5 mg via RESPIRATORY_TRACT
  Filled 2013-07-10: qty 3

## 2013-07-10 MED ORDER — IPRATROPIUM BROMIDE 0.02 % IN SOLN
0.5000 mg | Freq: Four times a day (QID) | RESPIRATORY_TRACT | Status: DC
  Administered 2013-07-10 – 2013-07-13 (×11): 0.5 mg via RESPIRATORY_TRACT
  Filled 2013-07-10 (×11): qty 2.5

## 2013-07-10 MED ORDER — ALBUTEROL SULFATE (2.5 MG/3ML) 0.083% IN NEBU: 2.5000 mg | INHALATION_SOLUTION | RESPIRATORY_TRACT | Status: DC | PRN

## 2013-07-10 MED ORDER — POTASSIUM CHLORIDE CRYS ER 20 MEQ PO TBCR
40.0000 meq | EXTENDED_RELEASE_TABLET | Freq: Once | ORAL | Status: AC
  Administered 2013-07-10: 40 meq via ORAL
  Filled 2013-07-10: qty 2

## 2013-07-10 MED ORDER — BISOPROLOL FUMARATE 5 MG PO TABS
5.0000 mg | ORAL_TABLET | Freq: Every day | ORAL | Status: DC
  Administered 2013-07-11: 5 mg via ORAL
  Filled 2013-07-10: qty 1

## 2013-07-10 MED ORDER — FUROSEMIDE 10 MG/ML IJ SOLN: 20.0000 mg | Freq: Once | INTRAMUSCULAR | Status: DC

## 2013-07-10 NOTE — Progress Notes (Signed)
Pt bladder was scan, 127cc of urine was noted.

## 2013-07-10 NOTE — Progress Notes (Signed)
ANTIBIOTIC CONSULT NOTE - INITIAL  Pharmacy Consult for cefepime, vancomycin Indication: HCAP  No Known Allergies  Patient Measurements: Height: 5' (152.4 cm) Weight: 127 lb 3.3 oz (57.7 kg) IBW/kg (Calculated) : 45.5   Vital Signs: Temp: 102.3 F (39.1 C) (02/15 1727) Temp src: Rectal (02/15 1727) BP: 129/69 mmHg (02/15 1600) Pulse Rate: 125 (02/15 1731) Intake/Output from previous day: 02/14 0701 - 02/15 0700 In: 503 [I.V.:503] Out: -  Intake/Output from this shift: Total I/O In: 3 [I.V.:3] Out: 900 [Urine:900]  Labs:  Recent Labs  07/07/13 2132  07/07/13 2229 07/08/13 0521 07/09/13 0332 07/10/13 0345  WBC 9.4  --   --  8.2 5.3  --   HGB 11.2*  < > 12.2 10.5* 9.9*  --   PLT 325  --   --  275 244  --   CREATININE  --   < > 6.80* 4.21* 1.07 0.80  < > = values in this interval not displayed. Estimated Creatinine Clearance: 50.6 ml/min (by C-G formula based on Cr of 0.8). No results found for this basename: VANCOTROUGH, Corlis Leak, VANCORANDOM, [place], GENTPEAK, GENTRANDOM, TOBRATROUGH, TOBRAPEAK, TOBRARND, AMIKACINPEAK, AMIKACINTROU, AMIKACIN,  in the last 72 hours   Microbiology: Recent Results (from the past 720 hour(s))  CLOSTRIDIUM DIFFICILE BY PCR     Status: None   Collection Time    07/08/13  9:53 AM      Result Value Ref Range Status   C difficile by pcr NEGATIVE  NEGATIVE Final  MRSA PCR SCREENING     Status: None   Collection Time    07/08/13 12:13 PM      Result Value Ref Range Status   MRSA by PCR NEGATIVE  NEGATIVE Final   Comment:            The GeneXpert MRSA Assay (FDA     approved for NASAL specimens     only), is one component of a     comprehensive MRSA colonization     surveillance program. It is not     intended to diagnose MRSA     infection nor to guide or     monitor treatment for     MRSA infections.    Medical History: Past Medical History  Diagnosis Date  . Cancer     breast CA 2002  . CHF (congestive heart  failure)   . Arthritis     Medications:  Scheduled:  . aspirin  81 mg Oral Daily  . [START ON 07/11/2013] bisoprolol  5 mg Oral Daily  . gabapentin  100 mg Oral TID  . guaiFENesin  600 mg Oral BID  . ipratropium  0.5 mg Nebulization Q6H  . levalbuterol  0.63 mg Nebulization Q6H  . levofloxacin (LEVAQUIN) IV  750 mg Intravenous Q24H  . methylPREDNISolone (SOLU-MEDROL) injection  80 mg Intravenous TID  . Rivaroxaban  15 mg Oral BID WC  . sodium chloride  3 mL Intravenous Q12H  . venlafaxine XR  150 mg Oral Q breakfast    Assessment: 73 yo female with suspected HCAP to start vancomycin and cefepime. WBC= 5.3, SCr= 0.8, CrCl ~ 50.   2/15 vanc 2/15 cefepime  2/15 resp 2/15 blood x2 2/13 c diff - neg  Goal of Therapy:  Vancomycin trough level 15-20 mcg/ml  Plan:  -Cefepime 2gm IV q12h -Vancomycin 1000mg  IV followed by 500mg  IV q12h -Will follow renal function, cultures and clinical progress  Hildred Laser, Pharm D 07/10/2013 5:48 PM

## 2013-07-10 NOTE — Progress Notes (Addendum)
Pt continues to experience periods of increase work of breathing, desaturation with minimum exertion, RR high 20's-30's, in addition to audible wheezes post lasix administration during 1500 hr.  Nurse requested MD assessment.  Nurse will continue to monitor

## 2013-07-10 NOTE — Progress Notes (Addendum)
Patient Demographics  [name], is a 73 y.o. female, DOB - Jul 25, 1940, PZW:[id number]  Admit date - 07/07/2013   Admitting Physician [name], MD  Outpatient Primary MD for the patient is Mary Pepper, MD  LOS - 3   Chief Complaint  Patient presents with  . Hypotension        Assessment & Plan    1. Severe dehydration, acute renal failure along with metabolic acidosis likely secondary to prerenal ischemia caused by combination of Lasix, Coreg and ACE inhibitor. Renal ultrasound is stable, acute renal failure resolved after IV fluids for hydration which will be continued at a gentle rate.  Monitor SPEP UPEP.     Addendum at 4 pm patient started getting SOB, wheezing   During her initial hospital course due to acute renal failure and dehydration she had received several liters of IV fluid, clinical suspicion was fluid overload therefore we gave her a trial of IV Lasix, however patient continued to be short of breath and continued to wheeze, eventually at 5:30 PM her temp came out to be 102, she also has significant history of smoking and CT scan suggestive of emphysema.   Clinical picture consistent of acute respiratory failure secondary to HCAP + COPD exacerbation. - Blood and sputum cultures ordered, IV Solomon drop, antibiotics for hospital-acquired pneumonia, stat chest x-ray, breathing treatments and oxygen. Monitor closely in step down unit       2. Hypotension due to #1 above. Afebrile, no leukocytosis, UA and chest x-ray clear, stable lactic acid level, supportive care as above along with IV fluid boluses if needed.    3. History of breast cancer. No acute issues outpatient monitor.    4. Chronic nonspecific CHF. Echo here shows a preserved EF and no diastolic  dysfunction.     5. Chr. diarrhea. Ruled out C. Difficile. Imodium when necessary.     6. Right-sided Pleuritic chest pain on 07/09/2013. Suspicious for PE, CT angiogram done which confirms left lower lobe PE, right-sided evaluation suboptimal due to motion, likely has right-sided PE also, will place on xaralto to be dosed by pharmacy, stop DVT prophylaxis dose heparin. Obtain lower extremity ultrasound. Outpatient followup with hematology post discharge. Likely PE secondary to breast cancer.     7. Hyperchloremia causing mild metabolic acidosis. Resolved after fluids change to LR.      8. Diplopia for weeks - MRI brain stable, has been following an ophthalmologist, advised to do so post DC.         Code Status: Full  Family Communication:    Disposition Plan: Likely home on 07/11/2013   Procedures Renal US - stable, echogram, CT angiogram chest, lower extremity venous duplex, MRI brain   Consults     Medications  Scheduled Meds: . aspirin  81 mg Oral Daily  . gabapentin  100 mg Oral TID  . metoprolol tartrate  25 mg Oral BID  . Rivaroxaban  15 mg Oral BID WC  . sodium chloride  3 mL Intravenous Q12H  . venlafaxine XR  150 mg Oral Q breakfast   Continuous Infusions:   PRN Meds:.acetaminophen, diphenoxylate-atropine, ibuprofen, metoprolol, morphine injection, nitroGLYCERIN, ondansetron (ZOFRAN) IV, sodium chloride  DVT Prophylaxis  xaralto   Lab Results  Component Value Date   PLT 244 07/09/2013    Antibiotics     Anti-infectives   None          Subjective:   [name] today has, No headache, mild right-sided pleuritic chest pain, No abdominal pain - No Nausea, No new weakness tingling or numbness, No Cough - SOB.   Objective:   Filed Vitals:   07/09/13 2300 07/10/13 0401 07/10/13 0800 07/10/13 0833  BP: 132/72 122/82 136/80 136/80  Pulse: 124 116 113 119  Temp: 98.9 F (37.2 C) 98.2 F (36.8 C) 98.5 F (36.9 C)   TempSrc: Oral  Oral Oral   Resp: 33 23 24   Height:      Weight:      SpO2: 99% 96% 98%     Wt Readings from Last 3 Encounters:  07/09/13 57.7 kg (127 lb 3.3 oz)     Intake/Output Summary (Last 24 hours) at 07/10/13 0907 Last data filed at 07/09/13 1700  Gross per 24 hour  Intake    403 ml  Output      0 ml  Net    403 ml     Physical Exam  Awake  Oriented X 1, No new F.N deficits, Normal affect [place].AT,PERRAL Supple Neck,No JVD, No cervical lymphadenopathy appriciated.  Symmetrical Chest wall movement, Good air movement bilaterally, CTAB RRR,No Gallops,Rubs or new Murmurs, No Parasternal Heave +ve B.Sounds, Abd Soft, Non tender, No organomegaly appriciated, No rebound - guarding or rigidity. No Cyanosis, Clubbing or edema, No new Rash or bruise      Data Review   Micro Results Recent Results (from the past 240 hour(s))  CLOSTRIDIUM DIFFICILE BY PCR     Status: None   Collection Time    07/08/13  9:53 AM      Result Value Ref Range Status   C difficile by pcr NEGATIVE  NEGATIVE Final  MRSA PCR SCREENING     Status: None   Collection Time    07/08/13 12:13 PM      Result Value Ref Range Status   MRSA by PCR NEGATIVE  NEGATIVE Final   Comment:            The GeneXpert MRSA Assay (FDA     approved for NASAL specimens     only), is one component of a     comprehensive MRSA colonization     surveillance program. It is not     intended to diagnose MRSA     infection nor to guide or     monitor treatment for     MRSA infections.    Radiology Reports US Renal  07/08/2013   CLINICAL DATA:  Acute renal failure.  EXAM: RENAL/URINARY TRACT ULTRASOUND COMPLETE  COMPARISON:  None.  FINDINGS: Right Kidney:  Length: 10.5. Echogenicity within normal limits. No mass or hydronephrosis visualized.  Left Kidney:  Length: 9.7. Echogenicity within normal limits. No mass or hydronephrosis visualized.  Bladder:  The bladder was not visualized.  IMPRESSION: Normal kidneys.   Electronically Signed    By: Rozetta Nunnery M.D.   On: 07/08/2013 06:52   Dg Chest Port 1 View  07/08/2013   CLINICAL DATA:  Low blood pressure, weakness  EXAM: PORTABLE CHEST - 1 VIEW  COMPARISON:  None.  FINDINGS: The heart size and mediastinal contours are within normal limits. There is no focal infiltrate, pulmonary edema, or pleural effusion. 7 mm calcified granuloma is identified in the medial right upper lobe. The visualized  skeletal structures are unremarkable.  IMPRESSION: No active cardiopulmonary disease.   Electronically Signed   By: Abelardo Diesel M.D.   On: 07/08/2013 01:05    CBC  Recent Labs Lab 07/07/13 2132 07/07/13 2153 07/07/13 2229 07/08/13 0521 07/09/13 0332  WBC 9.4  --   --  8.2 5.3  HGB 11.2* 11.9* 12.2 10.5* 9.9*  HCT 32.7* 35.0* 36.0 31.3* 29.0*  PLT 325  --   --  275 244  MCV 85.4  --   --  85.5 86.1  MCH 29.2  --   --  28.7 29.4  MCHC 34.3  --   --  33.5 34.1  RDW 13.6  --   --  13.4 13.8  LYMPHSABS  --   --   --  1.5  --   MONOABS  --   --   --  0.9  --   EOSABS  --   --   --  0.2  --   BASOSABS  --   --   --  0.0  --     Chemistries   Recent Labs Lab 07/07/13 2153 07/07/13 2220 07/07/13 2229 07/08/13 0521 07/09/13 0332 07/10/13 0345  NA 132* 134* 133* 137 144 141  K 2.6* 2.8* 2.5* 3.1* 4.1 3.8  CL 96 91* 96 101 115* 110  CO2  --  17*  --  19 16* 19  GLUCOSE 126* 137* 137* 120* 85 114*  BUN 61* 61* 65* 53* 35* 18  CREATININE 6.30* 6.11* 6.80* 4.21* 1.07 0.80  CALCIUM  --  8.8  --  7.9* 8.0* 8.4  MG  --   --   --   --  2.3  --   AST  --   --   --  13  --   --   ALT  --   --   --  10  --   --   ALKPHOS  --   --   --  90  --   --   BILITOT  --   --   --  <0.2*  --   --    ------------------------------------------------------------------------------------------------------------------ estimated creatinine clearance is 50.6 ml/min (by C-G formula based on Cr of  0.8). ------------------------------------------------------------------------------------------------------------------ No results found for this basename: HGBA1C,  in the last 72 hours ------------------------------------------------------------------------------------------------------------------ No results found for this basename: CHOL, HDL, LDLCALC, TRIG, CHOLHDL, LDLDIRECT,  in the last 72 hours ------------------------------------------------------------------------------------------------------------------  Recent Labs  07/08/13 1241  TSH 2.612   ------------------------------------------------------------------------------------------------------------------  Recent Labs  07/08/13 1241  VITAMINB12 >2000*  FOLATE >20.0  FERRITIN 64  TIBC 262  IRON 34*  RETICCTPCT 0.6    Coagulation profile No results found for this basename: INR, PROTIME,  in the last 168 hours  No results found for this basename: DDIMER,  in the last 72 hours  Cardiac Enzymes  Recent Labs Lab 07/09/13 1150 07/09/13 1410 07/09/13 2055  TROPONINI <0.30 <0.30 <0.30   ------------------------------------------------------------------------------------------------------------------ No components found with this basename: POCBNP,      Time Spent in minutes   35   SINGH,PRASHANT K M.D on 07/10/2013 at 9:07 AM  Between 7am to 7pm - Pager - [phone]  After 7pm go to [email or web address] - password [email or web address]  And look for the night coverage person covering for me after hours  [facility] Office  [phone]

## 2013-07-10 NOTE — Evaluation (Signed)
Physical Therapy Evaluation Patient Details Name: [name] MRN: [id number] DOB: [birth year] Today's Date: 07/10/2013 Time: 3976-7341 PT Time Calculation (min): 23 min  PT Assessment / Plan / Recommendation History of Present Illness  [name] is a 73 y.o. female history of breast cancer status post lumpectomy chemotherapy and radiation in 2002 following which patient had developed chemotherapy related CHF and is on lisinopril and Lasix and Coreg had gone to her orthopedic appointment yesterday and was found to have on record about blood pressure and was advised to go to her PCP where blood test was done and later patient's PCP called patient to go to the ER because her abnormal labs. In the ER labs were done and shows creatinine of 6 with metabolic acidosis and hypokalemia and anemia. Patient also was found to be hypotensive with blood pressure in the 93X systolic.   Clinical Impression  Patient presents with problems listed below.  Will benefit from acute PT to maximize mobility prior to discharge.  Recommend HHPT at discharge for continued therapy.    PT Assessment  Patient needs continued PT services    Follow Up Recommendations  Home health PT;Supervision - Intermittent    Does the patient have the potential to tolerate intense rehabilitation      Barriers to Discharge        Equipment Recommendations  None recommended by PT    Recommendations for Other Services     Frequency Min 3X/week    Precautions / Restrictions Precautions Precautions: None Precaution Comments: Has Rt drop foot and wears brace in shoe Required Braces or Orthoses: Other Brace/Splint Other Brace/Splint: AFO Rt LE Restrictions Weight Bearing Restrictions: No   Pertinent Vitals/Pain O2 sats at 99 - 100% with mobility on O2.      Mobility  Bed Mobility Overal bed mobility: Modified Independent General bed mobility comments: Patient able to move supine to sit with use of bed rail.  Good  sitting balance.  Patient able to don Rt LE brace and shoes Transfers Overall transfer level: Needs assistance Equipment used: Rolling walker (2 wheeled) Transfers: Sit to/from [facility] Sit to Stand: Min guard Stand pivot transfers: Min guard General transfer comment: Patient transferred bed <> BSC to void.  Required verbal cues for safety and transfer technique.  Patient then stood - verbal cues for hand placement - with assist for safety.   Ambulation/Gait Ambulation/Gait assistance: Min guard (+1 for lines) Ambulation Distance (Feet): 20 Feet Assistive device: Rolling walker (2 wheeled) Gait Pattern/deviations: Step-to pattern;Decreased stance time - right;Decreased step length - left;Decreased dorsiflexion - right;Trunk flexed Gait velocity: Slow gait speed Gait velocity interpretation: Below normal speed for age/gender General Gait Details: Verbal cues for safe use of RW.    Exercises     PT Diagnosis: Difficulty walking;Generalized weakness  PT Problem List: Decreased strength;Decreased activity tolerance;Decreased mobility;Decreased knowledge of use of DME;Cardiopulmonary status limiting activity PT Treatment Interventions: DME instruction;Gait training;Functional mobility training;Patient/family education     PT Goals(Current goals can be found in the care plan section) Acute Rehab PT Goals Patient Stated Goal: To get stronger PT Goal Formulation: With patient Time For Goal Achievement: 07/17/13 Potential to Achieve Goals: Good  Visit Information  Last PT Received On: 07/10/13 Assistance Needed: +1 History of Present Illness: [name] is a 73 y.o. female history of breast cancer status post lumpectomy chemotherapy and radiation in 2002 following which patient had developed chemotherapy related CHF and is on lisinopril and Lasix and Coreg had gone to  her orthopedic appointment yesterday and was found to have on record about blood pressure and was  advised to go to her PCP where blood test was done and later patient's PCP called patient to go to the ER because her abnormal labs. In the ER labs were done and shows creatinine of 6 with metabolic acidosis and hypokalemia and anemia. Patient also was found to be hypotensive with blood pressure in the 83M systolic.        Prior Functioning  Home Living Family/patient expects to be discharged to:: Other (Comment) Additional Comments: [place] - apartment Prior Function Level of Independence: Independent with assistive device(s) Comments: Uses rollator Communication Communication: No difficulties    Cognition  Cognition Arousal/Alertness: Awake/alert Behavior During Therapy: WFL for tasks assessed/performed Overall Cognitive Status: Within Functional Limits for tasks assessed    Extremity/Trunk Assessment Upper Extremity Assessment Upper Extremity Assessment: Overall WFL for tasks assessed Lower Extremity Assessment Lower Extremity Assessment: Generalized weakness;RLE deficits/detail RLE Deficits / Details: Noted Rt drop foot - 0/5 dorsiflexion   Balance    End of Session PT - End of Session Equipment Utilized During Treatment: Gait belt;Oxygen Activity Tolerance: Patient limited by fatigue Patient left: in chair;with call bell/phone within reach Nurse Communication: Mobility status  GP     Despina Pole 07/10/2013, 10:48 AM Carita Pian. Sanjuana Kava, [place] Pager [phone]

## 2013-07-10 NOTE — Progress Notes (Signed)
1500:  Transfer discontinued due to current respiratory status.  [place] notified  1420:  Pt experiencing SOB and wheezing; MD and respiratory called.  Receiving nurse on [address] called and informed of pt event.  1340:  [place] called and attempted to receive report from 2 [address] nurse, nurse unavailable at this time  1248: Nurse attempted to call report to [address] [place], receiving nurse unavailable at this time

## 2013-07-10 NOTE — Progress Notes (Addendum)
Post breathing treatment; wheezing audible only by auscultation.  MD contacted nurse and will write orders  Nurse called to pt room for assistance to bathroom, nurse witnessed wheezing (without auscultation), RR 20-30's, pt complained of SOB at this time.  Nurse contacted MD; MD ordered breathing treatment.  Respiratory contacted by nurse.  Nurse will continue to monitor

## 2013-07-11 ENCOUNTER — Inpatient Hospital Stay (HOSPITAL_COMMUNITY): Payer: Medicare Other

## 2013-07-11 LAB — BASIC METABOLIC PANEL
BUN: 13 mg/dL (ref 6–23)
CHLORIDE: 107 meq/L (ref 96–112)
CO2: 21 mEq/L (ref 19–32)
Calcium: 8.7 mg/dL (ref 8.4–10.5)
Creatinine, Ser: 0.68 mg/dL (ref 0.50–1.10)
GFR, EST NON AFRICAN AMERICAN: 85 mL/min — AB (ref >90)
Glucose, Bld: 160 mg/dL — ABNORMAL HIGH (ref 70–99)
POTASSIUM: 4.3 meq/L (ref 3.7–5.3)
Sodium: 140 mEq/L (ref 137–147)

## 2013-07-11 LAB — CBC
HEMATOCRIT: 31 % — AB (ref 36.0–46.0)
HEMOGLOBIN: 10.2 g/dL — AB (ref 12.0–15.0)
MCH: 28.7 pg (ref 26.0–34.0)
MCHC: 32.9 g/dL (ref 30.0–36.0)
MCV: 87.3 fL (ref 78.0–100.0)
Platelets: 236 10*3/uL (ref 150–400)
RBC: 3.55 MIL/uL — ABNORMAL LOW (ref 3.87–5.11)
RDW: 14.1 % (ref 11.5–15.5)
WBC: 4.9 10*3/uL (ref 4.0–10.5)

## 2013-07-11 MED ORDER — CARVEDILOL 6.25 MG PO TABS
6.2500 mg | ORAL_TABLET | Freq: Two times a day (BID) | ORAL | Status: DC
  Administered 2013-07-11 – 2013-07-14 (×6): 6.25 mg via ORAL
  Filled 2013-07-11 (×8): qty 1

## 2013-07-11 MED ORDER — FUROSEMIDE 10 MG/ML IJ SOLN
20.0000 mg | Freq: Once | INTRAMUSCULAR | Status: AC
  Administered 2013-07-11: 20 mg via INTRAVENOUS
  Filled 2013-07-11: qty 2

## 2013-07-11 MED ORDER — CARVEDILOL 12.5 MG PO TABS: 12.5000 mg | ORAL_TABLET | Freq: Two times a day (BID) | ORAL | Status: DC

## 2013-07-11 MED ORDER — SIMVASTATIN 40 MG PO TABS
40.0000 mg | ORAL_TABLET | Freq: Every day | ORAL | Status: DC
  Administered 2013-07-11 – 2013-07-13 (×3): 40 mg via ORAL
  Filled 2013-07-11 (×4): qty 1

## 2013-07-11 MED ORDER — DOCUSATE SODIUM 100 MG PO CAPS
200.0000 mg | ORAL_CAPSULE | Freq: Two times a day (BID) | ORAL | Status: DC
  Administered 2013-07-11 – 2013-07-14 (×7): 200 mg via ORAL
  Filled 2013-07-11 (×9): qty 2

## 2013-07-11 NOTE — Progress Notes (Signed)
Report called to [facility] on unit 2W.  Patient to be transferred to 2W05 via wheelchair

## 2013-07-11 NOTE — Care Management Note (Addendum)
    Page 1 of 2   07/14/2013     3:20:01 PM   CARE MANAGEMENT NOTE 07/14/2013  Patient:  [name], [name]   Account Number:  [id number]  Date Initiated:  07/11/2013  Documentation initiated by:  [facility]  Subjective/Objective Assessment:   Admitted with renal failure - developed CAP - sirs and now has PE - on heparin.     Action/Plan:   Anticipated DC Date:  07/13/2013   Anticipated DC Plan:  [place]  CM consult      [facility] Choice  HOME HEALTH   Choice offered to / List presented to:  C-1 Patient   DME arranged  [place]      DME agency  [place] arranged  HH-1 RN  [place].   Status of service:  Completed, signed off Medicare Important Message given?   (If response is "NO", the following Medicare IM given date fields will be blank) Date Medicare IM given:   Date Additional Medicare IM given:    Discharge Disposition:  [place]  Per UR Regulation:  Reviewed for med. necessity/level of care/duration of stay  If discussed at [place] of Stay Meetings, dates discussed:    Comments:  ContactAlysson, Vaughn     [phone]  07/14/13 Mary Cuoco,RN,BSN [phone] PT [place].  NOTIFIED [place].  SON GIVEN XARELTO FREE 30 DAY TRIAL CARD.  07/12/13 Mary Klugh,RN,BSN [phone] SPOKE WITH [facility] TO MAKE REFERRAL; THEY ARE NOT CONTRACTED WITH PT'S INSURANCE.  CALLED PT'S SON TO DISCUSS ALTERNATE HH AGENCY.  SON GIVEN PREFERRED PROVIDERS FOR INSURANCE, AND HE PREFERS [facility].  REFERRAL TO [facility], PER CHOICE.  START OF CARE 24-48H POST DC DATE.  PT TO DC TO SON'S HOME INITIALLY. [name] (SON) [address], [place] [address] [phone]   07/11/13 Mary Brannum,RN,BSN [phone] P.T. RECOMMENDING HH FOLLOW UP AT DC.  PT ON XARELTO; WILL CHECK INSURANCE COVERAGE AND GIVE PT FREE 3O DAY  TRIAL CARD. MET WITH PT, SON AND DTR IN [place] TO DISCUSS DC PLANS:  PER SON Vaughn, PT WILL DC HOME WITH HIM FOR 1-2 WEEKS UNTIL SHE IS STRONGER.   PT/SON REQUESTING HH FOLLOW UP, AS RECOMMENDED BY P.T.   PT PREFERS [facility].  PT WITH INTERMITTENT CONFUSION DURING OUR CONVERSATION.

## 2013-07-11 NOTE — Progress Notes (Signed)
Patient Demographics  [name], is a 73 y.o. female, DOB - 27-Jan-1941, MHD:[id number]  Admit date - 07/07/2013   Admitting Physician [name], MD  Outpatient Primary MD for the patient is [name], MD  LOS - 4   Chief Complaint  Patient presents with  . Hypotension        Assessment & Plan    1. Severe dehydration, acute renal failure along with metabolic acidosis likely secondary to prerenal ischemia caused by combination of Lasix, Coreg and ACE inhibitor. Renal ultrasound is stable, acute renal failure resolved after IV fluids for hydration which will be continued at a gentle rate.  Monitor SPEP UPEP.      2. Acute respiratory failure which she developed on 07/10/2013 evening. Likely due to combination of PE , COPD exacerbation and possibly early HCAP - she did run a fever of 102 on 07/10/2013, had wheezing on exam, much improved after empiric antibiotics appropriate for hospital-acquired pneumonia, IV sodium Medrol nebulizer treatments and oxygen. Continue the same for now.     3. Right-sided Pleuritic chest pain on 07/09/2013. Suspicious for PE, CT angiogram done which confirms left lower lobe PE, right-sided evaluation suboptimal due to motion, likely has right-sided PE also, will place on xaralto to be dosed by pharmacy, stop DVT prophylaxis dose heparin. Stable  lower extremity ultrasound and TTE. Outpatient followup with hematology post discharge. Likely PE secondary to breast cancer.  Stop ASA.   Lower Ext Duplex  No evidence of deep vein or superficial thrombosis involving the right lower extremity and left lower extremity. - No evidence of Baker's cyst on the right or left.      4. History of breast cancer. No acute issues outpatient monitor.     5.  Chronic nonspecific CHF. Echo here shows a preserved EF and no diastolic dysfunction.   Echo  - Left ventricle: The cavity size was normal. Systolic function was normal. The estimated ejection fraction was in the range of 55% to 60%. Wall motion was normal; there were no regional wall motion abnormalities. Left ventricular diastolic function parameters were normal. - Tricuspid valve: Moderate regurgitation. - Pulmonary arteries: PA peak pressure: 80mm Hg (S).      6. Chr. diarrhea. Ruled out C. Difficile. Imodium when necessary.      7.  Hypotension due to #1 above. Upon admission. Resolved with IV fluids.      8. Hyperchloremia causing mild metabolic acidosis. Resolved after fluids change to LR.      9. Diplopia for weeks - MRI brain stable, has been following an ophthalmologist, advised to do so post DC.         Code Status: Full  Family Communication:    Disposition Plan: Likely home on 07/11/2013   Procedures Renal US - stable, echogram, CT angiogram chest, lower extremity venous duplex, MRI brain   Consults     Medications  Scheduled Meds: . carvedilol  6.25 mg Oral BID WC  . ceFEPime (MAXIPIME) IV  2 g Intravenous Q12H  . docusate sodium  200 mg Oral BID  . furosemide  20 mg Intravenous Once  . gabapentin  100 mg Oral TID  . guaiFENesin  600 mg Oral BID  . ipratropium  0.5 mg Nebulization Q6H  . levalbuterol  0.63 mg Nebulization Q6H  . levofloxacin (LEVAQUIN) IV  750 mg Intravenous Q24H  . methylPREDNISolone (SOLU-MEDROL) injection  80 mg Intravenous TID  . Rivaroxaban  15 mg Oral BID WC  . simvastatin  40 mg Oral q1800  . sodium chloride  3 mL Intravenous Q12H  . vancomycin  500 mg Intravenous Q12H  . venlafaxine XR  150 mg Oral Q breakfast   Continuous Infusions:   PRN Meds:.acetaminophen, albuterol, diphenoxylate-atropine, metoprolol, morphine injection, nitroGLYCERIN, ondansetron (ZOFRAN) IV, sodium chloride  DVT Prophylaxis   xaralto   Lab Results  Component Value Date   PLT 236 07/11/2013    Antibiotics     Anti-infectives   Start     Dose/Rate Route Frequency Ordered Stop   07/11/13 0700  vancomycin (VANCOCIN) 500 mg in sodium chloride 0.9 % 100 mL IVPB     500 mg 100 mL/hr over 60 Minutes Intravenous Every 12 hours 07/10/13 1750     07/10/13 2000  levofloxacin (LEVAQUIN) IVPB 750 mg     750 mg 100 mL/hr over 90 Minutes Intravenous Every 24 hours 07/10/13 1727     07/10/13 1900  ceFEPIme (MAXIPIME) 2 g in dextrose 5 % 50 mL IVPB     2 g 100 mL/hr over 30 Minutes Intravenous Every 12 hours 07/10/13 1750     07/10/13 1900  vancomycin (VANCOCIN) IVPB 1000 mg/200 mL premix     1,000 mg 200 mL/hr over 60 Minutes Intravenous  Once 07/10/13 1750 07/10/13 2108          Subjective:   [name] today has, No headache, mild right-sided pleuritic chest pain, No abdominal pain - No Nausea, No new weakness tingling or numbness, No Cough - improved SOB.   Objective:   Filed Vitals:   07/11/13 0648 07/11/13 0725 07/11/13 0745 07/11/13 1054  BP:   119/70 120/90  Pulse:   96 95  Temp:   97.8 F (36.6 C) 97.8 F (36.6 C)  TempSrc:   Oral Oral  Resp:   22 18  Height:      Weight: 60.1 kg (132 lb 7.9 oz)     SpO2:  98% 100% 95%    Wt Readings from Last 3 Encounters:  07/11/13 60.1 kg (132 lb 7.9 oz)     Intake/Output Summary (Last 24 hours) at 07/11/13 1144 Last data filed at 07/10/13 2352  Gross per 24 hour  Intake      0 ml  Output   1050 ml  Net  -1050 ml     Physical Exam  Awake  Oriented X 1, No new F.N deficits, Normal affect [place].AT,PERRAL Supple Neck,No JVD, No cervical lymphadenopathy appriciated.  Symmetrical Chest wall movement, Good air movement bilaterally, mild wheezing RRR,No Gallops,Rubs or new Murmurs, No Parasternal Heave +ve B.Sounds, Abd Soft, Non tender, No organomegaly appriciated, No rebound - guarding or rigidity. No Cyanosis, Clubbing or edema, No new Rash or  bruise      Data Review   Micro Results Recent Results (from the past 240 hour(s))  CLOSTRIDIUM DIFFICILE BY PCR     Status: None   Collection Time    07/08/13  9:53 AM      Result Value Ref Range Status   C difficile by pcr NEGATIVE  NEGATIVE Final  MRSA PCR SCREENING     Status: None   Collection Time    07/08/13 12:13 PM      Result Value Ref  Range Status   MRSA by PCR NEGATIVE  NEGATIVE Final   Comment:            The GeneXpert MRSA Assay (FDA     approved for NASAL specimens     only), is one component of a     comprehensive MRSA colonization     surveillance program. It is not     intended to diagnose MRSA     infection nor to guide or     monitor treatment for     MRSA infections.    Radiology Reports US Renal  07/08/2013   CLINICAL DATA:  Acute renal failure.  EXAM: RENAL/URINARY TRACT ULTRASOUND COMPLETE  COMPARISON:  None.  FINDINGS: Right Kidney:  Length: 10.5. Echogenicity within normal limits. No mass or hydronephrosis visualized.  Left Kidney:  Length: 9.7. Echogenicity within normal limits. No mass or hydronephrosis visualized.  Bladder:  The bladder was not visualized.  IMPRESSION: Normal kidneys.   Electronically Signed   By: Rozetta Nunnery M.D.   On: 07/08/2013 06:52   Dg Chest Port 1 View  07/08/2013   CLINICAL DATA:  Low blood pressure, weakness  EXAM: PORTABLE CHEST - 1 VIEW  COMPARISON:  None.  FINDINGS: The heart size and mediastinal contours are within normal limits. There is no focal infiltrate, pulmonary edema, or pleural effusion. 7 mm calcified granuloma is identified in the medial right upper lobe. The visualized skeletal structures are unremarkable.  IMPRESSION: No active cardiopulmonary disease.   Electronically Signed   By: Abelardo Diesel M.D.   On: 07/08/2013 01:05    CBC  Recent Labs Lab 07/07/13 2132 07/07/13 2153 07/07/13 2229 07/08/13 0521 07/09/13 0332 07/11/13 0419  WBC 9.4  --   --  8.2 5.3 4.9  HGB 11.2* 11.9* 12.2 10.5* 9.9* 10.2*   HCT 32.7* 35.0* 36.0 31.3* 29.0* 31.0*  PLT 325  --   --  275 244 236  MCV 85.4  --   --  85.5 86.1 87.3  MCH 29.2  --   --  28.7 29.4 28.7  MCHC 34.3  --   --  33.5 34.1 32.9  RDW 13.6  --   --  13.4 13.8 14.1  LYMPHSABS  --   --   --  1.5  --   --   MONOABS  --   --   --  0.9  --   --   EOSABS  --   --   --  0.2  --   --   BASOSABS  --   --   --  0.0  --   --     Chemistries   Recent Labs Lab 07/07/13 2220 07/07/13 2229 07/08/13 0521 07/09/13 0332 07/10/13 0345 07/11/13 0419  NA 134* 133* 137 144 141 140  K 2.8* 2.5* 3.1* 4.1 3.8 4.3  CL 91* 96 101 115* 110 107  CO2 17*  --  19 16* 19 21  GLUCOSE 137* 137* 120* 85 114* 160*  BUN 61* 65* 53* 35* 18 13  CREATININE 6.11* 6.80* 4.21* 1.07 0.80 0.68  CALCIUM 8.8  --  7.9* 8.0* 8.4 8.7  MG  --   --   --  2.3  --   --   AST  --   --  13  --   --   --   ALT  --   --  10  --   --   --   ALKPHOS  --   --  90  --   --   --   BILITOT  --   --  <0.2*  --   --   --    ------------------------------------------------------------------------------------------------------------------ estimated creatinine clearance is 51.5 ml/min (by C-G formula based on Cr of 0.68). ------------------------------------------------------------------------------------------------------------------ No results found for this basename: HGBA1C,  in the last 72 hours ------------------------------------------------------------------------------------------------------------------ No results found for this basename: CHOL, HDL, LDLCALC, TRIG, CHOLHDL, LDLDIRECT,  in the last 72 hours ------------------------------------------------------------------------------------------------------------------  Recent Labs  07/08/13 1241  TSH 2.612   ------------------------------------------------------------------------------------------------------------------  Recent Labs  07/08/13 1241  VITAMINB12 >2000*  FOLATE >20.0  FERRITIN 64  TIBC 262  IRON 34*    RETICCTPCT 0.6    Coagulation profile No results found for this basename: INR, PROTIME,  in the last 168 hours  No results found for this basename: DDIMER,  in the last 72 hours  Cardiac Enzymes  Recent Labs Lab 07/09/13 1150 07/09/13 1410 07/09/13 2055  TROPONINI <0.30 <0.30 <0.30   ------------------------------------------------------------------------------------------------------------------ No components found with this basename: POCBNP,      Time Spent in minutes   35   Rickey Sadowski K M.D on 07/11/2013 at 11:44 AM  Between 7am to 7pm - Pager - [phone]  After 7pm go to [email or web address] - password [email or web address]  And look for the night coverage person covering for me after hours  [facility] Office  [phone]

## 2013-07-11 NOTE — Progress Notes (Signed)
Physical Therapy Treatment Patient Details Name: Shandell Giovanni MRN: [id number] DOB: 03/21/1941 Today's Date: 07/11/2013 Time: 4098-1191 PT Time Calculation (min): 30 min  PT Assessment / Plan / Recommendation  History of Present Illness Terrianne Cavness is a 73 y.o. female history of breast cancer status post lumpectomy chemotherapy and radiation in 2002 following which patient had developed chemotherapy related CHF and is on lisinopril and Lasix and Coreg had gone to her orthopedic appointment yesterday and was found to have on record about blood pressure and was advised to go to her PCP where blood test was done and later patient's PCP called patient to go to the ER because her abnormal labs. In the ER labs were done and shows creatinine of 6 with metabolic acidosis and hypokalemia and anemia. Patient also was found to be hypotensive with blood pressure in the 47W systolic.    PT Comments   Pt progressing well with activity without need for supplemental O2 today. Pt distractable and having difficulty remembering where she put items. Pt mobilizing well and encouraged increased gait trials with nursing assist and use of RW. Pt needs cues to keep RW with her particularly with transfers and transitions for safety. Will continue to follow. Pt able to complete pericare and brushing her teeth without assist.  Follow Up Recommendations  Home health PT;Supervision - Intermittent     Does the patient have the potential to tolerate intense rehabilitation     Barriers to Discharge        Equipment Recommendations       Recommendations for Other Services    Frequency     Progress towards PT Goals Progress towards PT goals: Progressing toward goals  Plan Current plan remains appropriate    Precautions / Restrictions Precautions Precautions: Fall Precaution Comments: Has Rt drop foot and wears brace in shoe Required Braces or Orthoses: Other Brace/Splint Other Brace/Splint: AFO Rt  LE Restrictions Weight Bearing Restrictions: No   Pertinent Vitals/Pain HR 115-138 with gait, back to 108 at rest sats 96% on RA No pain    Mobility  Bed Mobility Overal bed mobility: Modified Independent General bed mobility comments: with rail Transfers Overall transfer level: Needs assistance Transfers: Sit to/from Stand Sit to Stand: Supervision General transfer comment: cues for hand placement and safety with assist for line management Ambulation/Gait Ambulation/Gait assistance: Min guard Ambulation Distance (Feet): 400 Feet Assistive device: Rolling walker (2 wheeled) Gait Pattern/deviations: Step-through pattern;Decreased stance time - right;Decreased stride length Gait velocity interpretation: Below normal speed for age/gender General Gait Details: cues for position in RW and posture as well as directional cues    Exercises     PT Diagnosis:    PT Problem List:   PT Treatment Interventions:     PT Goals (current goals can now be found in the care plan section)    Visit Information  Last PT Received On: 07/11/13 Assistance Needed: +1 History of Present Illness: Chisa Kushner is a 73 y.o. female history of breast cancer status post lumpectomy chemotherapy and radiation in 2002 following which patient had developed chemotherapy related CHF and is on lisinopril and Lasix and Coreg had gone to her orthopedic appointment yesterday and was found to have on record about blood pressure and was advised to go to her PCP where blood test was done and later patient's PCP called patient to go to the ER because her abnormal labs. In the ER labs were done and shows creatinine of 6 with metabolic acidosis and hypokalemia and anemia.  Patient also was found to be hypotensive with blood pressure in the 99B systolic.     Subjective Data      Cognition  Cognition Arousal/Alertness: Awake/alert Behavior During Therapy: WFL for tasks assessed/performed Overall Cognitive Status:  Impaired/Different from baseline Area of Impairment: Attention;Problem solving Current Attention Level: Selective Problem Solving: Slow processing    Balance  Balance Overall balance assessment: Needs assistance Sitting balance-Leahy Scale: Poor  End of Session PT - End of Session Equipment Utilized During Treatment: Gait belt Activity Tolerance: Patient tolerated treatment well Patient left: in chair;with call bell/phone within reach Nurse Communication: Mobility status   GP     Melford Aase 07/11/2013, 10:53 AM Elwyn Reach, [place]

## 2013-07-12 ENCOUNTER — Ambulatory Visit: Payer: Medicare Other | Admitting: Physical Therapy

## 2013-07-12 MED ORDER — METHYLPREDNISOLONE SODIUM SUCC 125 MG IJ SOLR
80.0000 mg | Freq: Two times a day (BID) | INTRAMUSCULAR | Status: DC
  Administered 2013-07-12 (×2): 80 mg via INTRAVENOUS
  Administered 2013-07-13: 6.25 mg via INTRAVENOUS
  Filled 2013-07-12 (×4): qty 1.28

## 2013-07-12 MED ORDER — FUROSEMIDE 40 MG PO TABS
40.0000 mg | ORAL_TABLET | Freq: Every day | ORAL | Status: DC
  Administered 2013-07-13: 40 mg via ORAL
  Filled 2013-07-12: qty 1

## 2013-07-12 MED ORDER — LEVOFLOXACIN 750 MG PO TABS
750.0000 mg | ORAL_TABLET | Freq: Every day | ORAL | Status: DC
  Administered 2013-07-12 – 2013-07-13 (×2): 750 mg via ORAL
  Filled 2013-07-12 (×3): qty 1

## 2013-07-12 MED ORDER — FUROSEMIDE 10 MG/ML IJ SOLN
40.0000 mg | Freq: Once | INTRAMUSCULAR | Status: AC
  Administered 2013-07-12: 40 mg via INTRAVENOUS
  Filled 2013-07-12: qty 4

## 2013-07-12 MED ORDER — ALUM & MAG HYDROXIDE-SIMETH 200-200-20 MG/5ML PO SUSP
30.0000 mL | Freq: Three times a day (TID) | ORAL | Status: DC | PRN
  Administered 2013-07-12 – 2013-07-14 (×3): 30 mL via ORAL
  Filled 2013-07-12 (×3): qty 30

## 2013-07-12 MED ORDER — POTASSIUM CHLORIDE CRYS ER 20 MEQ PO TBCR
20.0000 meq | EXTENDED_RELEASE_TABLET | Freq: Every day | ORAL | Status: DC
  Administered 2013-07-12 – 2013-07-14 (×3): 20 meq via ORAL
  Filled 2013-07-12 (×3): qty 1

## 2013-07-12 MED ORDER — ZOLPIDEM TARTRATE 5 MG PO TABS
5.0000 mg | ORAL_TABLET | Freq: Every evening | ORAL | Status: DC | PRN
  Administered 2013-07-12: 5 mg via ORAL
  Filled 2013-07-12 (×2): qty 1

## 2013-07-12 NOTE — Progress Notes (Signed)
Patient Demographics  [name], is a 73 y.o. female, DOB - 1940/09/05, [place]:[id number]  Admit date - 07/07/2013   Admitting Physician Mary Patience, MD  Outpatient Primary MD for the patient is Mary Pepper, MD  LOS - 5   Chief Complaint  Patient presents with  . Hypotension      Summary  This is a 73 year old Caucasian female with history of underlying chronic diarrhea, early dementia, breast cancer, nonspecific CHF was admitted to the hospital after she was found to have abnormal labs at PCP office, in the ER she was found to be dehydrated, in acute renal failure, this has resolved with IV fluids. Unfortunately on day 2 of her hospital stay patient started complaining right-sided pleuritic chest pain and was diagnosed with PE, she subsequently on day 4 of her hospitalization developed a high fever and intense shortness of breath secondary to early hospital-acquired pneumonia versus undiagnosed COPD exacerbation. Also has evidence of fluid overload despite stable echogram with no systolic or diastolic dysfunction. Responding well to empiric antibiotics, anticoagulation, IV steroids and Lasix. Still has oxygen need some more short of breath, will require 1 to 2 more days of hospital stay.     Assessment & Plan    1. Severe dehydration, acute renal failure along with metabolic acidosis likely secondary to prerenal ischemia caused by combination of Lasix, Coreg and ACE inhibitor. Renal ultrasound is stable, acute renal failure resolved after IV fluids. Monitor SPEP UPEP.      2. Acute respiratory failure which she developed on 07/10/2013 evening. Likely due to combination of PE , COPD exacerbation and possibly early HCAP - she did run a fever of 102.3 on 07/10/2013, had wheezing on exam,  much improved after empiric antibiotics appropriate for hospital-acquired pneumonia, IV steroids, and Lasix for diuresis.   Much improved still has rales and wheezing, I will switch her to oral Levaquin today, stop IV vancomycin and cefepime, reduce IV Solu-Medrol, increase activity and monitor. Will place her on daily Lasix.      3. Right-sided Pleuritic chest pain on 07/09/2013. Suspicious for PE, CT angiogram done which confirms left lower lobe PE, right-sided evaluation suboptimal due to motion, likely has right-sided PE also, will place on xaralto to be dosed by pharmacy, stop DVT prophylaxis dose heparin. Stable  lower extremity ultrasound and TTE. Outpatient followup with hematology post discharge. Likely PE secondary to breast cancer.  Stop ASA.   Lower Ext Duplex  No evidence of deep vein or superficial thrombosis involving the right lower extremity and left lower extremity. No evidence of Baker's cyst on the right or left.      4. History of breast cancer. No acute issues outpatient monitor.     5. Chronic nonspecific CHF. Echo here shows a preserved EF and no diastolic dysfunction.   Echo  - Left ventricle: The cavity size was normal. Systolic function was normal. The estimated ejection fraction was in the range of 55% to 60%. Wall motion was normal; there were no regional wall motion abnormalities. Left ventricular diastolic function parameters were normal. - Tricuspid valve: Moderate regurgitation. - Pulmonary arteries: PA peak pressure: 44mm Hg (S).      6. Chr. diarrhea. Ruled out C. Difficile. Imodium when necessary.  7.  Hypotension due to #1 above. Upon admission. Resolved with IV fluids.      8. Hyperchloremia causing mild metabolic acidosis. Resolved after fluids change to LR.      9. Diplopia for weeks - MRI brain stable, has been following an ophthalmologist, advised to do so post DC.         Code Status: Full  Family  Communication:  updated son in detail on 07/11/2013  Disposition Plan: Likely home on 07/11/2013   Procedures Renal US - stable, echogram, CT angiogram chest, lower extremity venous duplex, MRI brain   Consults     Medications  Scheduled Meds: . carvedilol  6.25 mg Oral BID WC  . docusate sodium  200 mg Oral BID  . furosemide  40 mg Intravenous Once  . [START ON 07/13/2013] furosemide  40 mg Oral Daily  . gabapentin  100 mg Oral TID  . guaiFENesin  600 mg Oral BID  . ipratropium  0.5 mg Nebulization Q6H  . levalbuterol  0.63 mg Nebulization Q6H  . levofloxacin  750 mg Oral Daily  . methylPREDNISolone (SOLU-MEDROL) injection  80 mg Intravenous Q12H  . potassium chloride  20 mEq Oral Daily  . Rivaroxaban  15 mg Oral BID WC  . simvastatin  40 mg Oral q1800  . sodium chloride  3 mL Intravenous Q12H  . venlafaxine XR  150 mg Oral Q breakfast   Continuous Infusions:   PRN Meds:.acetaminophen, albuterol, diphenoxylate-atropine, metoprolol, morphine injection, nitroGLYCERIN, ondansetron (ZOFRAN) IV, sodium chloride  DVT Prophylaxis  xaralto   Lab Results  Component Value Date   PLT 236 07/11/2013    Antibiotics     Anti-infectives   Start     Dose/Rate Route Frequency Ordered Stop   07/12/13 1000  levofloxacin (LEVAQUIN) tablet 750 mg     750 mg Oral Daily 07/12/13 0831     07/11/13 0700  vancomycin (VANCOCIN) 500 mg in sodium chloride 0.9 % 100 mL IVPB  Status:  Discontinued     500 mg 100 mL/hr over 60 Minutes Intravenous Every 12 hours 07/10/13 1750 07/12/13 0831   07/10/13 2000  levofloxacin (LEVAQUIN) IVPB 750 mg  Status:  Discontinued     750 mg 100 mL/hr over 90 Minutes Intravenous Every 24 hours 07/10/13 1727 07/12/13 0831   07/10/13 1900  ceFEPIme (MAXIPIME) 2 g in dextrose 5 % 50 mL IVPB  Status:  Discontinued     2 g 100 mL/hr over 30 Minutes Intravenous Every 12 hours 07/10/13 1750 07/12/13 0831   07/10/13 1900  vancomycin (VANCOCIN) IVPB 1000 mg/200 mL  premix     1,000 mg 200 mL/hr over 60 Minutes Intravenous  Once 07/10/13 1750 07/10/13 2108          Subjective:   [name] today has, No headache, mild right-sided pleuritic chest pain, No abdominal pain - No Nausea, No new weakness tingling or numbness, No Cough - improved SOB.   Objective:   Filed Vitals:   07/11/13 1524 07/11/13 2045 07/12/13 0330 07/12/13 0901  BP:  124/75 117/75   Pulse:  89 93   Temp:  98.1 F (36.7 C) 97.2 F (36.2 C)   TempSrc:  Oral Oral   Resp:  19 18   Height:      Weight:   60.3 kg (132 lb 15 oz)   SpO2: 96% 97% 95% 97%    Wt Readings from Last 3 Encounters:  07/12/13 60.3 kg (132 lb 15 oz)  Intake/Output Summary (Last 24 hours) at 07/12/13 1017 Last data filed at 07/11/13 2311  Gross per 24 hour  Intake    270 ml  Output      0 ml  Net    270 ml     Physical Exam  Awake  Oriented X 1, No new F.N deficits, Normal affect Strong.AT,PERRAL Supple Neck,No JVD, No cervical lymphadenopathy appriciated.  Symmetrical Chest wall movement, Good air movement bilaterally, mild wheezing RRR,No Gallops,Rubs or new Murmurs, No Parasternal Heave +ve B.Sounds, Abd Soft, Non tender, No organomegaly appriciated, No rebound - guarding or rigidity. No Cyanosis, Clubbing or edema, No new Rash or bruise      Data Review   Micro Results Recent Results (from the past 240 hour(s))  CLOSTRIDIUM DIFFICILE BY PCR     Status: None   Collection Time    07/08/13  9:53 AM      Result Value Ref Range Status   C difficile by pcr NEGATIVE  NEGATIVE Final  MRSA PCR SCREENING     Status: None   Collection Time    07/08/13 12:13 PM      Result Value Ref Range Status   MRSA by PCR NEGATIVE  NEGATIVE Final   Comment:            The GeneXpert MRSA Assay (FDA     approved for NASAL specimens     only), is one component of a     comprehensive MRSA colonization     surveillance program. It is not     intended to diagnose MRSA     infection nor to guide  or     monitor treatment for     MRSA infections.  CULTURE, BLOOD (ROUTINE X 2)     Status: None   Collection Time    07/10/13  7:21 PM      Result Value Ref Range Status   Specimen Description BLOOD RIGHT HAND   Final   Special Requests BOTTLES DRAWN AEROBIC ONLY 2CC   Final   Culture  Setup Time     Final   Value: 07/11/2013 01:43     Performed at [facility]   Culture     Final   Value:        BLOOD CULTURE RECEIVED NO GROWTH TO DATE CULTURE WILL BE HELD FOR 5 DAYS BEFORE ISSUING A FINAL NEGATIVE REPORT     Performed at [facility]   Report Status PENDING   Incomplete  CULTURE, BLOOD (ROUTINE X 2)     Status: None   Collection Time    07/10/13  7:30 PM      Result Value Ref Range Status   Specimen Description BLOOD LEFT HAND   Final   Special Requests BOTTLES DRAWN AEROBIC ONLY 5CC   Final   Culture  Setup Time     Final   Value: 07/11/2013 01:43     Performed at [facility]   Culture     Final   Value:        BLOOD CULTURE RECEIVED NO GROWTH TO DATE CULTURE WILL BE HELD FOR 5 DAYS BEFORE ISSUING A FINAL NEGATIVE REPORT     Performed at [facility]   Report Status PENDING   Incomplete    Radiology Reports US Renal  07/08/2013   CLINICAL DATA:  Acute renal failure.  EXAM: RENAL/URINARY TRACT ULTRASOUND COMPLETE  COMPARISON:  None.  FINDINGS: Right Kidney:  Length: 10.5. Echogenicity  within normal limits. No mass or hydronephrosis visualized.  Left Kidney:  Length: 9.7. Echogenicity within normal limits. No mass or hydronephrosis visualized.  Bladder:  The bladder was not visualized.  IMPRESSION: Normal kidneys.   Electronically Signed   By: Rozetta Nunnery M.D.   On: 07/08/2013 06:52   Dg Chest Port 1 View  07/08/2013   CLINICAL DATA:  Low blood pressure, weakness  EXAM: PORTABLE CHEST - 1 VIEW  COMPARISON:  None.  FINDINGS: The heart size and mediastinal contours are within normal limits. There is no focal infiltrate, pulmonary edema, or pleural  effusion. 7 mm calcified granuloma is identified in the medial right upper lobe. The visualized skeletal structures are unremarkable.  IMPRESSION: No active cardiopulmonary disease.   Electronically Signed   By: Abelardo Diesel M.D.   On: 07/08/2013 01:05    CBC  Recent Labs Lab 07/07/13 2132 07/07/13 2153 07/07/13 2229 07/08/13 0521 07/09/13 0332 07/11/13 0419  WBC 9.4  --   --  8.2 5.3 4.9  HGB 11.2* 11.9* 12.2 10.5* 9.9* 10.2*  HCT 32.7* 35.0* 36.0 31.3* 29.0* 31.0*  PLT 325  --   --  275 244 236  MCV 85.4  --   --  85.5 86.1 87.3  MCH 29.2  --   --  28.7 29.4 28.7  MCHC 34.3  --   --  33.5 34.1 32.9  RDW 13.6  --   --  13.4 13.8 14.1  LYMPHSABS  --   --   --  1.5  --   --   MONOABS  --   --   --  0.9  --   --   EOSABS  --   --   --  0.2  --   --   BASOSABS  --   --   --  0.0  --   --     Chemistries   Recent Labs Lab 07/07/13 2220 07/07/13 2229 07/08/13 0521 07/09/13 0332 07/10/13 0345 07/11/13 0419  NA 134* 133* 137 144 141 140  K 2.8* 2.5* 3.1* 4.1 3.8 4.3  CL 91* 96 101 115* 110 107  CO2 17*  --  19 16* 19 21  GLUCOSE 137* 137* 120* 85 114* 160*  BUN 61* 65* 53* 35* 18 13  CREATININE 6.11* 6.80* 4.21* 1.07 0.80 0.68  CALCIUM 8.8  --  7.9* 8.0* 8.4 8.7  MG  --   --   --  2.3  --   --   AST  --   --  13  --   --   --   ALT  --   --  10  --   --   --   ALKPHOS  --   --  90  --   --   --   BILITOT  --   --  <0.2*  --   --   --    ------------------------------------------------------------------------------------------------------------------ estimated creatinine clearance is 51.6 ml/min (by C-G formula based on Cr of 0.68). ------------------------------------------------------------------------------------------------------------------ No results found for this basename: HGBA1C,  in the last 72 hours ------------------------------------------------------------------------------------------------------------------ No results found for this basename: CHOL,  HDL, LDLCALC, TRIG, CHOLHDL, LDLDIRECT,  in the last 72 hours ------------------------------------------------------------------------------------------------------------------ No results found for this basename: TSH, T4TOTAL, FREET3, T3FREE, THYROIDAB,  in the last 72 hours ------------------------------------------------------------------------------------------------------------------ No results found for this basename: VITAMINB12, FOLATE, FERRITIN, TIBC, IRON, RETICCTPCT,  in the last 72 hours  Coagulation profile No results found for this basename: INR,  PROTIME,  in the last 168 hours  No results found for this basename: DDIMER,  in the last 72 hours  Cardiac Enzymes  Recent Labs Lab 07/09/13 1150 07/09/13 1410 07/09/13 2055  TROPONINI <0.30 <0.30 <0.30   ------------------------------------------------------------------------------------------------------------------ No components found with this basename: POCBNP,      Time Spent in minutes   35   Lala Lund K M.D on 07/12/2013 at 10:16 AM  Between 7am to 7pm - Pager - [phone]  After 7pm go to [email or web address] - password [email or web address]  And look for the night coverage person covering for me after hours  [facility] Office  [phone]

## 2013-07-12 NOTE — Progress Notes (Signed)
ANTICOAGULATION CONSULT NOTE - Initial Consult  Pharmacy Consult for Xarelto Indication: pulmonary embolus  No Known Allergies  Patient Measurements: Height: 5' (152.4 cm) Weight: 132 lb 15 oz (60.3 kg) IBW/kg (Calculated) : 45.5  Vital Signs: Temp: 97.2 F (36.2 C) (02/17 0330) Temp src: Oral (02/17 0330) BP: 117/75 mmHg (02/17 0330) Pulse Rate: 93 (02/17 0330)  Labs:  Recent Labs  07/09/13 1150 07/09/13 1410 07/09/13 2055 07/10/13 0345 07/11/13 0419  HGB  --   --   --   --  10.2*  HCT  --   --   --   --  31.0*  PLT  --   --   --   --  236  CREATININE  --   --   --  0.80 0.68  TROPONINI <0.30 <0.30 <0.30  --   --    Medical History: Past Medical History  Diagnosis Date  . Cancer     breast CA 2002  . CHF (congestive heart failure)   . Arthritis     Medications:  Prescriptions prior to admission  Medication Sig Dispense Refill  . Calcium Citrate-Vitamin D (CALCITRATE/VITAMIN D PO) Take 5 mLs by mouth daily.      . carvedilol (COREG) 12.5 MG tablet Take 12.5 mg by mouth 2 (two) times daily with a meal.      . furosemide (LASIX) 20 MG tablet Take 20 mg by mouth.      . gabapentin (NEURONTIN) 300 MG capsule Take 300 mg by mouth 3 (three) times daily.      Marland Kitchen lisinopril (PRINIVIL,ZESTRIL) 20 MG tablet Take 20 mg by mouth daily.      . Multiple Vitamin (MULTIVITAMIN WITH MINERALS) TABS tablet Take 2 tablets by mouth 3 (three) times daily.      . simvastatin (ZOCOR) 40 MG tablet Take 40 mg by mouth daily at 6 PM.      . venlafaxine XR (EFFEXOR-XR) 150 MG 24 hr capsule Take 150 mg by mouth daily with breakfast.       Assessment: 73 yo F with h/o breast cancer referred to ED on 2/13 for AKI with metabolic acidosis, hyperkalemia and anemia. CT confirms LLL PE, now on Xarelto. Of note, H/H low but stable, no reported s/s bleeding. FOBT negative, iron studies low. AKI resolving, SCr now 0.68.  Goal of Therapy:  Treatment of PE  Plan:  - Xarelto 15 mg PO BID with  meals x 21 days (last dose on 3/6), followed by 20 mg PO daily with a meal (to start on 3/7) - Monitor renal function, CBC, s/sx bleeding - Xarelto education completed by ekv on 2/15  Hughes Better, PharmD, BCPS Clinical Pharmacist  07/12/2013 9:27 AM

## 2013-07-13 DIAGNOSIS — I509 Heart failure, unspecified: Secondary | ICD-10-CM

## 2013-07-13 DIAGNOSIS — I2699 Other pulmonary embolism without acute cor pulmonale: Secondary | ICD-10-CM

## 2013-07-13 LAB — CBC
HCT: 27.5 % — ABNORMAL LOW (ref 36.0–46.0)
HEMOGLOBIN: 9.2 g/dL — AB (ref 12.0–15.0)
MCH: 29.4 pg (ref 26.0–34.0)
MCHC: 33.5 g/dL (ref 30.0–36.0)
MCV: 87.9 fL (ref 78.0–100.0)
Platelets: 262 10*3/uL (ref 150–400)
RBC: 3.13 MIL/uL — AB (ref 3.87–5.11)
RDW: 14.8 % (ref 11.5–15.5)
WBC: 11.8 10*3/uL — ABNORMAL HIGH (ref 4.0–10.5)

## 2013-07-13 LAB — PROTEIN ELECTROPHORESIS, SERUM
ALPHA-1-GLOBULIN: 8.2 % — AB (ref 2.9–4.9)
Albumin ELP: 54 % — ABNORMAL LOW (ref 55.8–66.1)
Alpha-2-Globulin: 16.5 % — ABNORMAL HIGH (ref 7.1–11.8)
Beta 2: 3.6 % (ref 3.2–6.5)
Beta Globulin: 6.8 % (ref 4.7–7.2)
Gamma Globulin: 10.9 % — ABNORMAL LOW (ref 11.1–18.8)
M-Spike, %: NOT DETECTED g/dL
TOTAL PROTEIN ELP: 5.8 g/dL — AB (ref 6.0–8.3)

## 2013-07-13 LAB — BASIC METABOLIC PANEL
BUN: 26 mg/dL — ABNORMAL HIGH (ref 6–23)
CALCIUM: 8.9 mg/dL (ref 8.4–10.5)
CO2: 22 mEq/L (ref 19–32)
Chloride: 104 mEq/L (ref 96–112)
Creatinine, Ser: 1 mg/dL (ref 0.50–1.10)
GFR calc Af Amer: 64 mL/min — ABNORMAL LOW (ref >90)
GFR calc non Af Amer: 55 mL/min — ABNORMAL LOW (ref >90)
GLUCOSE: 148 mg/dL — AB (ref 70–99)
POTASSIUM: 4.2 meq/L (ref 3.7–5.3)
SODIUM: 141 meq/L (ref 137–147)

## 2013-07-13 MED ORDER — FLUTICASONE PROPIONATE HFA 44 MCG/ACT IN AERO
2.0000 | INHALATION_SPRAY | Freq: Two times a day (BID) | RESPIRATORY_TRACT | Status: DC
  Administered 2013-07-13 – 2013-07-14 (×2): 2 via RESPIRATORY_TRACT
  Filled 2013-07-13: qty 10.6

## 2013-07-13 MED ORDER — TIOTROPIUM BROMIDE MONOHYDRATE 18 MCG IN CAPS
18.0000 ug | ORAL_CAPSULE | Freq: Every day | RESPIRATORY_TRACT | Status: DC
  Administered 2013-07-14: 18 ug via RESPIRATORY_TRACT
  Filled 2013-07-13 (×2): qty 5

## 2013-07-13 MED ORDER — FUROSEMIDE 20 MG PO TABS
20.0000 mg | ORAL_TABLET | Freq: Every day | ORAL | Status: DC
  Administered 2013-07-14: 20 mg via ORAL
  Filled 2013-07-13: qty 1

## 2013-07-13 MED ORDER — METHYLPREDNISOLONE SODIUM SUCC 40 MG IJ SOLR: 40.0000 mg | Freq: Three times a day (TID) | INTRAMUSCULAR | Status: DC

## 2013-07-13 MED ORDER — METHYLPREDNISOLONE SODIUM SUCC 125 MG IJ SOLR
60.0000 mg | Freq: Three times a day (TID) | INTRAMUSCULAR | Status: DC
  Administered 2013-07-13 – 2013-07-14 (×3): 60 mg via INTRAVENOUS
  Filled 2013-07-13 (×6): qty 0.96

## 2013-07-13 NOTE — Progress Notes (Signed)
Physical Therapy Treatment Patient Details Name: Tomeeka Plaugher MRN: [id number] DOB: [birth year] Today's Date: 07/13/2013 Time: 0272-5366 PT Time Calculation (min): 33 min  PT Assessment / Plan / Recommendation  History of Present Illness Anupama Piehl is a 73 y.o. female history of breast cancer status post lumpectomy chemotherapy and radiation in 2002 following which patient had developed chemotherapy related CHF and is on lisinopril and Lasix and Coreg had gone to her orthopedic appointment yesterday and was found to have on record about blood pressure and was advised to go to her PCP where blood test was done and later patient's PCP called patient to go to the ER because her abnormal labs. In the ER labs were done and shows creatinine of 6 with metabolic acidosis and hypokalemia and anemia. Patient also was found to be hypotensive with blood pressure in the 44I systolic.    PT Comments   Progressing well.  Still getting dyspneic with wheezing and a bit tachycardic, but sats staying at mid to upper 90's.  Pt now plans to go to her son's home for the short term until able to be alone.   Follow Up Recommendations  Home health PT;Supervision - Intermittent     Does the patient have the potential to tolerate intense rehabilitation     Barriers to Discharge        Equipment Recommendations  None recommended by PT    Recommendations for Other Services    Frequency Min 3X/week   Progress towards PT Goals Progress towards PT goals: Progressing toward goals  Plan Current plan remains appropriate    Precautions / Restrictions Precautions Precautions: Fall Precaution Comments: Has Rt drop foot and wears brace in shoe Other Brace/Splint: AFO Rt LE   Pertinent Vitals/Pain During gait on 2L [place], sats 95-98%,  EHR 122-128 bpm with some wheezing, but quick recovery with pursed-lip breathing.    Mobility  Transfers Overall transfer level: Needs assistance Equipment used: Rolling walker (2  wheeled) Transfers: Sit to/from Stand Sit to Stand: Supervision General transfer comment: cues for hand placement and safety with assist for line management Ambulation/Gait Ambulation/Gait assistance: Supervision Ambulation Distance (Feet): 280 Feet Assistive device: Rolling walker (2 wheeled) Gait Pattern/deviations: Step-through pattern;Decreased stride length Gait velocity: Slow gait speed Gait velocity interpretation: Below normal speed for age/gender General Gait Details: decentl control of R foot by AFO, but does cause pt some mild instability    Exercises     PT Diagnosis:    PT Problem List:   PT Treatment Interventions:     PT Goals (current goals can now be found in the care plan section) Acute Rehab PT Goals Patient Stated Goal: To get stronger PT Goal Formulation: With patient Time For Goal Achievement: 07/17/13 Potential to Achieve Goals: Good  Visit Information  Last PT Received On: 07/13/13 Assistance Needed: +1 History of Present Illness: Lashawnta Burgert is a 73 y.o. female history of breast cancer status post lumpectomy chemotherapy and radiation in 2002 following which patient had developed chemotherapy related CHF and is on lisinopril and Lasix and Coreg had gone to her orthopedic appointment yesterday and was found to have on record about blood pressure and was advised to go to her PCP where blood test was done and later patient's PCP called patient to go to the ER because her abnormal labs. In the ER labs were done and shows creatinine of 6 with metabolic acidosis and hypokalemia and anemia. Patient also was found to be hypotensive with blood pressure in the  14E systolic.     Subjective Data  Subjective: Oh, I had forgotten all about the pursed-lip breathing Patient Stated Goal: To get stronger   Cognition  Cognition Arousal/Alertness: Awake/alert Behavior During Therapy: WFL for tasks assessed/performed Overall Cognitive Status: Within Functional Limits for  tasks assessed    Balance  Balance Overall balance assessment: Needs assistance Sitting balance-Leahy Scale: Good Standing balance-Leahy Scale: Fair  End of Session PT - End of Session Equipment Utilized During Treatment: Oxygen Activity Tolerance: Patient tolerated treatment well Patient left: in chair;with call bell/phone within reach Nurse Communication: Mobility status   GP     Saachi Zale, Tessie Fass 07/13/2013, 4:11 PM 07/13/2013  Donnella Sham, [place] [phone]  (pager)

## 2013-07-13 NOTE — Progress Notes (Signed)
[facility] PROGRESS NOTE  [name] [id number] DOB: Dec 22, 1940 DOA: 07/07/2013 PCP: [name], MD  Assessment/Plan: 1. Acute respiratory failure - Multifactorial most likely related to recent PE and COPD exacerbation. - Continue bronchodilators. We'll discontinue Atrovent and place on Spiriva - Will also add inhaled corticosteroid given persistent wheezes - Will adjust IV Solu-Medrol to 60 mg IV 3 times daily - Supplemental oxygen when necessary - Could also be secondary to CHF given elevated BNP although echocardiogram did not confirm CHF. We'll continue Lasix but at 20 mg daily given elevated BUN/creatinine  2. Acute renal failure - Resolved with IV fluid rehydration - Slight elevation in serum creatinine most likely secondary to Lasix. We'll change dose decreased to 20 mg by mouth daily  3. History of breast cancer - Patient to continue routine outpatient followup once discharged from hospital  4. Acute PE -Patient recently started on xarelto and tolerating well. - Will continue to monitor hemoglobin - Lower extremity duplex reported no evidence of deep vein or superficial thrombosis in lower extremities  5. chronic diarrhea -C. difficile ruled out -Imodium when necessary  Code Status: full Family Communication: Discussed directly with patient Disposition Plan: Pending improvement in breathing condition.   Consultants:  None  Procedures:  Lower extremity dupplex  Antibiotics:  Levaquin  HPI/Subjective: Pt has no new complaints. Her main concern is that she gets short of breath with activity. She would like to be discharged soon but realizes that her breathing condition is not at baseline.  Objective: Filed Vitals:   07/13/13 0614  BP: 135/87  Pulse: 95  Temp: 97.3 F (36.3 C)  Resp: 20    Intake/Output Summary (Last 24 hours) at 07/13/13 1235 Last data filed at 07/13/13 0900  Gross per 24 hour  Intake    480 ml  Output    850 ml   Net   -370 ml   Filed Weights   07/11/13 0648 07/12/13 0330 07/13/13 0614  Weight: 60.1 kg (132 lb 7.9 oz) 60.3 kg (132 lb 15 oz) 59.4 kg (130 lb 15.3 oz)    Exam:   General:  Patient in no acute distress, alert and awake  Cardiovascular: Regular rate and rhythm, no murmur  Respiratory: Diffuse wheezing bilaterally, speaking in broken sentences, nasal cannula in place  Abdomen: Soft, nondistended, nontender  Musculoskeletal: No cyanosis or clubbing  Data Reviewed: Basic Metabolic Panel:  Recent Labs Lab 07/08/13 0521 07/09/13 0332 07/10/13 0345 07/11/13 0419 07/13/13 0330  NA 137 144 141 140 141  K 3.1* 4.1 3.8 4.3 4.2  CL 101 115* 110 107 104  CO2 19 16* 19 21 22   GLUCOSE 120* 85 114* 160* 148*  BUN 53* 35* 18 13 26*  CREATININE 4.21* 1.07 0.80 0.68 1.00  CALCIUM 7.9* 8.0* 8.4 8.7 8.9  MG  --  2.3  --   --   --    Liver Function Tests:  Recent Labs Lab 07/08/13 0521  AST 13  ALT 10  ALKPHOS 90  BILITOT <0.2*  PROT 6.2  ALBUMIN 3.0*   No results found for this basename: LIPASE, AMYLASE,  in the last 168 hours No results found for this basename: AMMONIA,  in the last 168 hours CBC:  Recent Labs Lab 07/07/13 2132  07/07/13 2229 07/08/13 0521 07/09/13 0332 07/11/13 0419 07/13/13 0330  WBC 9.4  --   --  8.2 5.3 4.9 11.8*  NEUTROABS  --   --   --  5.5  --   --   --  HGB 11.2*  < > 12.2 10.5* 9.9* 10.2* 9.2*  HCT 32.7*  < > 36.0 31.3* 29.0* 31.0* 27.5*  MCV 85.4  --   --  85.5 86.1 87.3 87.9  PLT 325  --   --  275 244 236 262  < > = values in this interval not displayed. Cardiac Enzymes:  Recent Labs Lab 07/08/13 0203 07/09/13 1150 07/09/13 1410 07/09/13 2055  CKTOTAL 82  --   --   --   TROPONINI <0.30 <0.30 <0.30 <0.30   BNP (last 3 results)  Recent Labs  07/10/13 1921  PROBNP 5930.0*   CBG: No results found for this basename: GLUCAP,  in the last 168 hours  Recent Results (from the past 240 hour(s))  CLOSTRIDIUM DIFFICILE BY  PCR     Status: None   Collection Time    07/08/13  9:53 AM      Result Value Ref Range Status   C difficile by pcr NEGATIVE  NEGATIVE Final  MRSA PCR SCREENING     Status: None   Collection Time    07/08/13 12:13 PM      Result Value Ref Range Status   MRSA by PCR NEGATIVE  NEGATIVE Final   Comment:            The GeneXpert MRSA Assay (FDA     approved for NASAL specimens     only), is one component of a     comprehensive MRSA colonization     surveillance program. It is not     intended to diagnose MRSA     infection nor to guide or     monitor treatment for     MRSA infections.  CULTURE, BLOOD (ROUTINE X 2)     Status: None   Collection Time    07/10/13  7:21 PM      Result Value Ref Range Status   Specimen Description BLOOD RIGHT HAND   Final   Special Requests BOTTLES DRAWN AEROBIC ONLY 2CC   Final   Culture  Setup Time     Final   Value: 07/11/2013 01:43     Performed at [facility]   Culture     Final   Value:        BLOOD CULTURE RECEIVED NO GROWTH TO DATE CULTURE WILL BE HELD FOR 5 DAYS BEFORE ISSUING A FINAL NEGATIVE REPORT     Performed at [facility]   Report Status PENDING   Incomplete  CULTURE, BLOOD (ROUTINE X 2)     Status: None   Collection Time    07/10/13  7:30 PM      Result Value Ref Range Status   Specimen Description BLOOD LEFT HAND   Final   Special Requests BOTTLES DRAWN AEROBIC ONLY 5CC   Final   Culture  Setup Time     Final   Value: 07/11/2013 01:43     Performed at [facility]   Culture     Final   Value:        BLOOD CULTURE RECEIVED NO GROWTH TO DATE CULTURE WILL BE HELD FOR 5 DAYS BEFORE ISSUING A FINAL NEGATIVE REPORT     Performed at [facility]   Report Status PENDING   Incomplete     Studies: No results found.  Scheduled Meds: . carvedilol  6.25 mg Oral BID WC  . docusate sodium  200 mg Oral BID  . fluticasone  2 puff Inhalation BID  .  furosemide  40 mg Oral Daily  . gabapentin  100 mg Oral  TID  . guaiFENesin  600 mg Oral BID  . levalbuterol  0.63 mg Nebulization Q6H  . levofloxacin  750 mg Oral Daily  . methylPREDNISolone (SOLU-MEDROL) injection  60 mg Intravenous 3 times per day  . potassium chloride  20 mEq Oral Daily  . Rivaroxaban  15 mg Oral BID WC  . simvastatin  40 mg Oral q1800  . sodium chloride  3 mL Intravenous Q12H  . tiotropium  18 mcg Inhalation Daily  . venlafaxine XR  150 mg Oral Q breakfast   Continuous Infusions:   Principal Problem:   Acute renal failure Active Problems:   Metabolic acidosis   Hypokalemia   Anemia   History of breast cancer   CHF (congestive heart failure)   Diarrhea    Time spent: > 35 minutes    Velvet Bathe  [facility] Pager [phone] If 7PM-7AM, please contact night-coverage at [email or web address], password [email or web address] 07/13/2013, 12:35 PM  LOS: 6 days

## 2013-07-14 LAB — BASIC METABOLIC PANEL
BUN: 26 mg/dL — AB (ref 6–23)
CO2: 27 meq/L (ref 19–32)
Calcium: 8.8 mg/dL (ref 8.4–10.5)
Chloride: 102 mEq/L (ref 96–112)
Creatinine, Ser: 0.93 mg/dL (ref 0.50–1.10)
GFR calc Af Amer: 69 mL/min — ABNORMAL LOW (ref >90)
GFR, EST NON AFRICAN AMERICAN: 60 mL/min — AB (ref >90)
GLUCOSE: 159 mg/dL — AB (ref 70–99)
Potassium: 4.2 mEq/L (ref 3.7–5.3)
SODIUM: 142 meq/L (ref 137–147)

## 2013-07-14 LAB — CBC
HEMATOCRIT: 29.2 % — AB (ref 36.0–46.0)
HEMOGLOBIN: 9.6 g/dL — AB (ref 12.0–15.0)
MCH: 28.8 pg (ref 26.0–34.0)
MCHC: 32.9 g/dL (ref 30.0–36.0)
MCV: 87.7 fL (ref 78.0–100.0)
Platelets: 277 10*3/uL (ref 150–400)
RBC: 3.33 MIL/uL — AB (ref 3.87–5.11)
RDW: 14.8 % (ref 11.5–15.5)
WBC: 12.9 10*3/uL — ABNORMAL HIGH (ref 4.0–10.5)

## 2013-07-14 MED ORDER — GABAPENTIN 100 MG PO CAPS: 100.0000 mg | ORAL_CAPSULE | Freq: Three times a day (TID) | ORAL | Status: DC

## 2013-07-14 MED ORDER — ZOLPIDEM TARTRATE 5 MG PO TABS: 5.0000 mg | ORAL_TABLET | Freq: Every evening | ORAL | Status: DC | PRN

## 2013-07-14 MED ORDER — PREDNISONE (PAK) 10 MG PO TABS: ORAL_TABLET | Freq: Every day | ORAL | Status: DC

## 2013-07-14 MED ORDER — RIVAROXABAN 15 MG PO TABS: 15.0000 mg | ORAL_TABLET | Freq: Two times a day (BID) | ORAL | Status: DC

## 2013-07-14 MED ORDER — TIOTROPIUM BROMIDE MONOHYDRATE 18 MCG IN CAPS: 18.0000 ug | ORAL_CAPSULE | Freq: Every day | RESPIRATORY_TRACT | Status: DC

## 2013-07-14 MED ORDER — ALBUTEROL SULFATE (2.5 MG/3ML) 0.083% IN NEBU: 2.5000 mg | INHALATION_SOLUTION | RESPIRATORY_TRACT | Status: DC | PRN

## 2013-07-14 MED ORDER — CARVEDILOL 6.25 MG PO TABS: 6.2500 mg | ORAL_TABLET | Freq: Two times a day (BID) | ORAL | Status: DC

## 2013-07-14 MED ORDER — LEVOFLOXACIN 750 MG PO TABS
750.0000 mg | ORAL_TABLET | ORAL | Status: DC
  Administered 2013-07-14: 750 mg via ORAL
  Filled 2013-07-14: qty 1

## 2013-07-14 MED ORDER — LISINOPRIL 2.5 MG PO TABS: 2.5000 mg | ORAL_TABLET | Freq: Every day | ORAL | Status: DC

## 2013-07-14 MED ORDER — FLUTICASONE PROPIONATE HFA 44 MCG/ACT IN AERO: 2.0000 | INHALATION_SPRAY | Freq: Two times a day (BID) | RESPIRATORY_TRACT | Status: DC

## 2013-07-14 NOTE — Progress Notes (Signed)
Physical Therapy Treatment Patient Details Name: [name] MRN: [id number] DOB: 08/24/1940 Today's Date: 07/14/2013 Time: 0938-1829 PT Time Calculation (min): 32 min  PT Assessment / Plan / Recommendation  History of Present Illness [name] is a 73 y.o. female history of breast cancer status post lumpectomy chemotherapy and radiation in 2002 following which patient had developed chemotherapy related CHF and is on lisinopril and Lasix and Coreg had gone to her orthopedic appointment yesterday and was found to have on record about blood pressure and was advised to go to her PCP where blood test was done and later patient's PCP called patient to go to the ER because her abnormal labs. In the ER labs were done and shows creatinine of 6 with metabolic acidosis and hypokalemia and anemia. Patient also was found to be hypotensive with blood pressure in the 93Z systolic.    PT Comments   Has progress well, but not yet independent with ambulation or stairs.   As of session today, pt ambulated without oxygen and maintained sats well.  Follow Up Recommendations  Home health PT;Supervision - Intermittent     Does the patient have the potential to tolerate intense rehabilitation     Barriers to Discharge        Equipment Recommendations  None recommended by PT    Recommendations for Other Services    Frequency Min 3X/week   Progress towards PT Goals Progress towards PT goals: Progressing toward goals  Plan Current plan remains appropriate    Precautions / Restrictions Precautions Precautions: Fall Precaution Comments: Has Rt drop foot and wears brace in shoe Required Braces or Orthoses: Other Brace/Splint Other Brace/Splint: AFO Rt LE   Pertinent Vitals/Pain During amb. sats 96% on RA and EHR 116-118 bpm (consistent over several checks)    Mobility  Bed Mobility Overal bed mobility: Modified Independent Transfers Overall transfer level: Modified independent Sit to Stand:  Modified independent (Device/Increase time) Ambulation/Gait Ambulation/Gait assistance: Supervision Ambulation Distance (Feet): 150 Feet (times 2) Assistive device: Rolling walker (2 wheeled) Gait Pattern/deviations: Step-through pattern Gait velocity interpretation: at or above normal speed for age/gender General Gait Details: steady with use of AFO Stairs: Yes Stairs assistance: Min guard Stair Management: One rail Right;Step to pattern;Forwards Number of Stairs: 3    Exercises     PT Diagnosis:    PT Problem List:   PT Treatment Interventions:     PT Goals (current goals can now be found in the care plan section) Acute Rehab PT Goals PT Goal Formulation: With patient Time For Goal Achievement: 07/17/13 Potential to Achieve Goals: Good  Visit Information  Last PT Received On: 07/14/13 Assistance Needed: +1 History of Present Illness: [name] is a 73 y.o. female history of breast cancer status post lumpectomy chemotherapy and radiation in 2002 following which patient had developed chemotherapy related CHF and is on lisinopril and Lasix and Coreg had gone to her orthopedic appointment yesterday and was found to have on record about blood pressure and was advised to go to her PCP where blood test was done and later patient's PCP called patient to go to the ER because her abnormal labs. In the ER labs were done and shows creatinine of 6 with metabolic acidosis and hypokalemia and anemia. Patient also was found to be hypotensive with blood pressure in the 16R systolic.     Subjective Data  Subjective: I'm happy I feel so much better off the oxygen   Cognition  Cognition Arousal/Alertness: Awake/alert Behavior During Therapy:  WFL for tasks assessed/performed Overall Cognitive Status: Within Functional Limits for tasks assessed    Balance  Balance Overall balance assessment: No apparent balance deficits (not formally assessed)  End of Session PT - End of Session Equipment  Utilized During Treatment: Other (comment) (oxygen carried but not used) Activity Tolerance: Patient tolerated treatment well Patient left: in chair;with call bell/phone within reach Nurse Communication: Mobility status   GP     Eulala Newcombe, Tessie Fass 07/14/2013, 10:23 AM 07/14/2013  Donnella Sham, [place] [phone]  (pager)

## 2013-07-14 NOTE — Discharge Instructions (Signed)
Home Health Care to be provided by [facility] [phone]

## 2013-07-14 NOTE — Discharge Summary (Signed)
Physician Discharge Summary  [name] [id number] DOB: 07-27-1940 DOA: 07/07/2013  PCP: [name], MD  Admit date: 07/07/2013 Discharge date: 07/14/2013  Time spent: >35 min  Recommendations for Outpatient Follow-up:  1. Patient to follow up with primary care provider within the next two weeks. 2. Started on Xarelto due to PE. Take twice daily. 3. Complete prednisone taper. Take Ambien as needed while on prednisone for steroid induced insomnia (per patient) 4. Patient stared on Spiriva and Flovent for COPD. Albuterol as needed.  Discharge Diagnoses:  Principal Problem:   Acute renal failure Active Problems:   Metabolic acidosis   Hypokalemia   Anemia   History of breast cancer   CHF (congestive heart failure)   Diarrhea   Acute pulmonary embolism   Discharge Condition: Stable  Diet recommendation: Heart healthy/low sodium  Filed Weights   07/12/13 0330 07/13/13 0614 07/14/13 0507  Weight: 60.3 kg (132 lb 15 oz) 59.4 kg (130 lb 15.3 oz) 59.376 kg (130 lb 14.4 oz)    History of present illness:  [name] is a 73 y.o. female presented to the ER because her labs. In the ER labs were done and showed creatinine of 6 with metabolic acidosis, hypokalemia and anemia. Patient also was found to be hypotensive with blood pressure in the [id number] systolic.  Patient stated that she had been having diarrhea for a long time now. She has chronic low back pain had surgery last year.   Hospital Course:  1. Acute respiratory failure - Multifactorial most likely related to recent PE and COPD exacerbation.  - Continue bronchodilators. Discontinued Atrovent and place on Spiriva  - Added inhaled corticosteroid given persistent wheezes  - Solu-Medrol given IV. Discharged on prednisone taper - Supplemental oxygen as needed - Could also be secondary to CHF given elevated BNP although echocardiogram did not confirm CHF. Continue Lasix but at 20 mg daily   2. Acute renal failure  -  Resolved with IV fluid rehydration  - Slight elevation in serum creatinine most likely secondary to Lasix. Changed dose to 20 mg by mouth daily   3. History of breast cancer  - Patient to continue routine outpatient followup once discharged from hospital   4. Acute PE  -Patient recently started on xarelto and tolerating well.  - Pt will need to have her hemoglobin levels monitored moving forward. Hemoglobin trending up on day of discharge. - Lower extremity duplex reported no evidence of deep vein or superficial thrombosis in lower extremities   5. chronic diarrhea  -C. difficile ruled out  -Imodium when necessary   Procedures:  Lower extremity dupplex  Consultations:  PT: recommendations for home health PT; supervision-Intermittent  Has progress well, but not yet independent with ambulation or stairs. As of session 07/14/13, pt ambulated  without oxygen and maintained sats well.  Antibiotics:  Levaquin (5/5 day course)  Discharge Exam: Filed Vitals:   07/14/13 0507  BP: 121/79  Pulse: 93  Temp: 98 F (36.7 C)  Resp: 18    General: Patient in no acute distress, alert and awake  Cardiovascular: Regular rate and rhythm, no murmur  Respiratory: Diffuse wheezing bilaterally, speaking in full sentences, no nasal canula Abdomen: Soft, nondistended, nontender  Musculoskeletal: No cyanosis or clubbing   Discharge Instructions  Discharge Orders   Future Orders Complete By Expires   Call MD for:  difficulty breathing, headache or visual disturbances  As directed    Call MD for:  temperature >100.4  As directed  Diet - low sodium heart healthy  As directed    Discharge instructions  As directed    Comments:     Follow up with Primary care provider within 2 weeks. Take oral steroids as prescribed. Use home oxygen as needed.   Increase activity slowly  As directed        Medication List         albuterol (2.5 MG/3ML) 0.083% nebulizer solution  Commonly known as:   PROVENTIL  Take 3 mLs (2.5 mg total) by nebulization every 2 (two) hours as needed for wheezing or shortness of breath.     CALCITRATE/VITAMIN D PO  Take 5 mLs by mouth daily.     carvedilol 6.25 MG tablet  Commonly known as:  COREG  Take 1 tablet (6.25 mg total) by mouth 2 (two) times daily with a meal.     fluticasone 44 MCG/ACT inhaler  Commonly known as:  FLOVENT HFA  Inhale 2 puffs into the lungs 2 (two) times daily.     furosemide 20 MG tablet  Commonly known as:  LASIX  Take 20 mg by mouth.     gabapentin 100 MG capsule  Commonly known as:  NEURONTIN  Take 1 capsule (100 mg total) by mouth 3 (three) times daily.     lisinopril 2.5 MG tablet  Commonly known as:  PRINIVIL,ZESTRIL  Take 1 tablet (2.5 mg total) by mouth daily.     multivitamin with minerals Tabs tablet  Take 2 tablets by mouth 3 (three) times daily.     predniSONE 10 MG tablet  Commonly known as:  STERAPRED UNI-PAK  Take by mouth daily. Take 50 mg by mouth daily for the next 2 days, then take 40 mg by mouth daily for the next 2 days, then take 30 mg by mouth daily for the next 2 days then take 20 mg daily for the next 2 days then take 10 mg daily for the next 2 days.     Rivaroxaban 15 MG Tabs tablet  Commonly known as:  XARELTO  Take 1 tablet (15 mg total) by mouth 2 (two) times daily with a meal.     simvastatin 40 MG tablet  Commonly known as:  ZOCOR  Take 40 mg by mouth daily at 6 PM.     tiotropium 18 MCG inhalation capsule  Commonly known as:  SPIRIVA  Place 1 capsule (18 mcg total) into inhaler and inhale daily.     venlafaxine XR 150 MG 24 hr capsule  Commonly known as:  EFFEXOR-XR  Take 150 mg by mouth daily with breakfast.     zolpidem 5 MG tablet  Commonly known as:  AMBIEN  Take 1 tablet (5 mg total) by mouth at bedtime as needed for sleep.       No Known Allergies    The results of significant diagnostics from this hospitalization (including imaging, microbiology, ancillary  and laboratory) are listed below for reference.    Significant Diagnostic Studies: Ct Angio Chest Pe W/cm &/or Wo Cm  07/09/2013   CLINICAL DATA:  Shortness of breath.  Cough.  Hypotension.  EXAM: CT ANGIOGRAPHY CHEST WITH CONTRAST  TECHNIQUE: Multidetector CT imaging of the chest was performed using the standard protocol during bolus administration of intravenous contrast. Multiplanar CT image reconstructions and MIPs were obtained to evaluate the vascular anatomy.  CONTRAST:  50 mL of Omnipaque 350.  COMPARISON:  No priors.  FINDINGS: Mediastinum: Study is limited by considerable respiratory motion, particularly limits  interpretation of the right lung base. Despite these limitations, there is a small subsegmental size filling defect within pulmonary artery branches to the posterior left lower lobe best demonstrated on images 206-209 of series 6. No definite larger central, lobar or segmental sized filling defects are noted, although portions of the segmental sized branches in the right middle and lower lobe are obscured by motion. Heart size is normal. No significant pericardial fluid, thickening or pericardial calcification. There is atherosclerosis of the thoracic aorta, the great vessels of the mediastinum and the coronary arteries, including calcified atherosclerotic plaque in the left main, left anterior descending, left circumflex and right coronary arteries. No pathologically enlarged mediastinal or hilar lymph nodes. Calcified low right peritracheal lymph nodes.  Lungs/Pleura: 2 calcified granulomas are noted in the posterior aspect of the right upper lobe, largest of which measures 9 mm. Small calcified granuloma associated with the left major fissure. No other larger more suspicious appearing pulmonary nodules or masses are otherwise noted. No acute consolidative airspace disease. No pleural effusions. Subsegmental atelectasis in the right lower lobe. Mild diffuse bronchial wall thickening. Mild to  moderate centrilobular emphysema, most pronounced throughout the lung apices.  Upper Abdomen: Numerous calcified granulomas in the spleen.  Musculoskeletal: There are no aggressive appearing lytic or blastic lesions noted in the visualized portions of the skeleton.  Review of the MIP images confirms the above findings.  IMPRESSION: 1. Although this study is severely limited by respiratory motion, there is a small subsegmental sized embolus to the left lower lobe. No larger emboli are noted, although assessment of the right lower lobe is severely limited by a large amount of patient respiratory motion. Correlation with lower extremity Doppler ultrasound may be warranted to exclude the presence of underlying deep venous thrombosis. 2. Atherosclerosis, including left main and 3 vessel coronary artery disease. Assessment for potential risk factor modification, dietary therapy or pharmacologic therapy may be warranted, if clinically indicated. 3. Subsegmental atelectasis in the right lower lobe. 4. Mild diffuse bronchial wall thickening with mild to moderate centrilobular emphysema; imaging findings suggestive of underlying COPD. 5. Sequela of old granulomatous disease, as above.   Electronically Signed   By: Vinnie Langton M.D.   On: 07/09/2013 10:27   Mr Brain Wo Contrast  07/09/2013   CLINICAL DATA:  Double vision  EXAM: MRI HEAD WITHOUT CONTRAST  TECHNIQUE: Multiplanar, multiecho pulse sequences of the brain and surrounding structures were obtained without intravenous contrast.  COMPARISON:  None available.  FINDINGS: Diffuse prominence of the CSF containing spaces is compatible with generalized cerebral atrophy, moderate for patient age. Extensive T2/FLAIR hyperintensity within the periventricular and deep white matter is most compatible with moderate chronic microvascular ischemic changes. An area of more focal wedge-shaped encephalomalacia seen within the anterior left frontal lobe is compatible with remote  infarct (series 5, image 12).  No mass lesion, midline shift, or extra-axial fluid collection. Ventricles are normal in size without evidence of hydrocephalus.  No diffusion-weighted signal abnormality is identified to suggest acute intracranial infarct. Gray-white matter differentiation is maintained. Normal flow voids are seen within the intracranial vasculature. No intracranial hemorrhage identified.  The cervicomedullary junction is normal. Pituitary gland is within normal limits. Pituitary stalk is midline. The globes and optic nerves demonstrate a normal appearance with normal signal intensity. The  The bone marrow signal intensity is normal. Calvarium is intact. Visualized upper cervical spine is within normal limits.  Scalp soft tissues are unremarkable.  Paranasal sinuses are clear.  No mastoid effusion.  IMPRESSION: 1. No acute intracranial infarct or other abnormality. 2. Remote left frontal lobe infarct. 3. Moderate generalized cerebral atrophy and chronic microvascular ischemic changes.   Electronically Signed   By: Jeannine Boga M.D.   On: 07/09/2013 01:36   US Renal  07/08/2013   CLINICAL DATA:  Acute renal failure.  EXAM: RENAL/URINARY TRACT ULTRASOUND COMPLETE  COMPARISON:  None.  FINDINGS: Right Kidney:  Length: 10.5. Echogenicity within normal limits. No mass or hydronephrosis visualized.  Left Kidney:  Length: 9.7. Echogenicity within normal limits. No mass or hydronephrosis visualized.  Bladder:  The bladder was not visualized.  IMPRESSION: Normal kidneys.   Electronically Signed   By: Rozetta Nunnery M.D.   On: 07/08/2013 06:52   Dg Chest Port 1 View  07/11/2013   CLINICAL DATA:  Cough, shortness of breath  EXAM: PORTABLE CHEST - 1 VIEW  COMPARISON:  DG CHEST 1V PORT dated 07/10/2013; CT ANGIO CHEST W/CM &/OR WO/CM dated 07/09/2013; DG CHEST 1V PORT dated 07/08/2013  FINDINGS: Heart size and vascular pattern normal. Lungs clear. 5 mm calcified azygos lymph node on the right is stable. No  pleural effusions.  IMPRESSION: No acute abnormalities.   Electronically Signed   By: Skipper Cliche M.D.   On: 07/11/2013 07:53   Dg Chest Port 1 View  07/10/2013   CLINICAL DATA:  Shortness of breath.  Wheezing.  Cough.  EXAM: PORTABLE CHEST - 1 VIEW  COMPARISON:  Chest CT 07/09/2013.  Chest x-ray 07/08/2013.  FINDINGS: Small calcified granuloma in the medial aspect of the right upper lobe is unchanged. No acute consolidative airspace disease. No pleural effusions. No evidence of pulmonary edema. Heart size is normal. Mediastinal contours are unremarkable.  IMPRESSION: 1. No radiographic evidence of acute cardiopulmonary disease.   Electronically Signed   By: Vinnie Langton M.D.   On: 07/10/2013 18:10   Dg Chest Port 1 View  07/08/2013   CLINICAL DATA:  Low blood pressure, weakness  EXAM: PORTABLE CHEST - 1 VIEW  COMPARISON:  None.  FINDINGS: The heart size and mediastinal contours are within normal limits. There is no focal infiltrate, pulmonary edema, or pleural effusion. 7 mm calcified granuloma is identified in the medial right upper lobe. The visualized skeletal structures are unremarkable.  IMPRESSION: No active cardiopulmonary disease.   Electronically Signed   By: Abelardo Diesel M.D.   On: 07/08/2013 01:05    Microbiology: Recent Results (from the past 240 hour(s))  CLOSTRIDIUM DIFFICILE BY PCR     Status: None   Collection Time    07/08/13  9:53 AM      Result Value Ref Range Status   C difficile by pcr NEGATIVE  NEGATIVE Final  MRSA PCR SCREENING     Status: None   Collection Time    07/08/13 12:13 PM      Result Value Ref Range Status   MRSA by PCR NEGATIVE  NEGATIVE Final   Comment:            The GeneXpert MRSA Assay (FDA     approved for NASAL specimens     only), is one component of a     comprehensive MRSA colonization     surveillance program. It is not     intended to diagnose MRSA     infection nor to guide or     monitor treatment for     MRSA infections.   CULTURE, BLOOD (ROUTINE X 2)     Status: None  Collection Time    07/10/13  7:21 PM      Result Value Ref Range Status   Specimen Description BLOOD RIGHT HAND   Final   Special Requests BOTTLES DRAWN AEROBIC ONLY 2CC   Final   Culture  Setup Time     Final   Value: 07/11/2013 01:43     Performed at [facility]   Culture     Final   Value:        BLOOD CULTURE RECEIVED NO GROWTH TO DATE CULTURE WILL BE HELD FOR 5 DAYS BEFORE ISSUING A FINAL NEGATIVE REPORT     Performed at [facility]   Report Status PENDING   Incomplete  CULTURE, BLOOD (ROUTINE X 2)     Status: None   Collection Time    07/10/13  7:30 PM      Result Value Ref Range Status   Specimen Description BLOOD LEFT HAND   Final   Special Requests BOTTLES DRAWN AEROBIC ONLY 5CC   Final   Culture  Setup Time     Final   Value: 07/11/2013 01:43     Performed at [facility]   Culture     Final   Value:        BLOOD CULTURE RECEIVED NO GROWTH TO DATE CULTURE WILL BE HELD FOR 5 DAYS BEFORE ISSUING A FINAL NEGATIVE REPORT     Performed at [facility]   Report Status PENDING   Incomplete     Labs: Basic Metabolic Panel:  Recent Labs Lab 07/08/13 0521 07/09/13 0332 07/10/13 0345 07/11/13 0419 07/13/13 0330 07/14/13 0302  NA 137 144 141 140 141 142  K 3.1* 4.1 3.8 4.3 4.2 4.2  CL 101 115* 110 107 104 102  CO2 19 16* 19 21 22 27   GLUCOSE 120* 85 114* 160* 148* 159*  BUN 53* 35* 18 13 26* 26*  CREATININE 4.21* 1.07 0.80 0.68 1.00 0.93  CALCIUM 7.9* 8.0* 8.4 8.7 8.9 8.8  MG  --  2.3  --   --   --   --    Liver Function Tests:  Recent Labs Lab 07/08/13 0521  AST 13  ALT 10  ALKPHOS 90  BILITOT <0.2*  PROT 6.2  ALBUMIN 3.0*   No results found for this basename: LIPASE, AMYLASE,  in the last 168 hours No results found for this basename: AMMONIA,  in the last 168 hours CBC:  Recent Labs Lab 07/07/13 2229 07/08/13 0521 07/09/13 0332 07/11/13 0419 07/13/13 0330  07/14/13 0302  WBC  --  8.2 5.3 4.9 11.8* 12.9*  NEUTROABS  --  5.5  --   --   --   --   HGB 12.2 10.5* 9.9* 10.2* 9.2* 9.6*  HCT 36.0 31.3* 29.0* 31.0* 27.5* 29.2*  MCV  --  85.5 86.1 87.3 87.9 87.7  PLT  --  275 244 236 262 277   Cardiac Enzymes:  Recent Labs Lab 07/08/13 0203 07/09/13 1150 07/09/13 1410 07/09/13 2055  CKTOTAL 82  --   --   --   TROPONINI <0.30 <0.30 <0.30 <0.30   BNP: BNP (last 3 results)  Recent Labs  07/10/13 1921  PROBNP 5930.0*   CBG: No results found for this basename: GLUCAP,  in the last 168 hours     Signed:  Velvet Bathe  [facility] 07/14/2013, 10:43 AM

## 2013-07-14 NOTE — Progress Notes (Signed)
ANTIBIOTIC  NOTE - FOLLOW UP  Patient on Levaquin for acute respiratory failure/COPD exacerbation  Labs:  Recent Labs  07/13/13 0330 07/14/13 0302  WBC 11.8* 12.9*  HGB 9.2* 9.6*  PLT 262 277  CREATININE 1.00 0.93   Estimated Creatinine Clearance: 44.1 ml/min (by C-G formula based on Cr of 0.93).  Based on CrCl < 50, will adjust levaquin dose to 750 mg q 48 hrs.  This dose should be adequate for duration of therapy.   Please contact if any questions.  Thanks!  Uvaldo Rising, BCPS  Clinical Pharmacist Pager [phone]  07/14/2013 9:39 AM

## 2013-07-17 LAB — CULTURE, BLOOD (ROUTINE X 2)
CULTURE: NO GROWTH
CULTURE: NO GROWTH

## 2015-09-26 ENCOUNTER — Observation Stay (HOSPITAL_COMMUNITY)
Admission: EM | Admit: 2015-09-26 | Discharge: 2015-09-27 | Disposition: A | Discharge status: Home / Self Care | Payer: Medicare Other | Attending: Internal Medicine | Admitting: Internal Medicine

## 2015-09-26 ENCOUNTER — Encounter (HOSPITAL_COMMUNITY): Payer: Self-pay | Admitting: Emergency Medicine

## 2015-09-26 DIAGNOSIS — R7989 Other specified abnormal findings of blood chemistry: Secondary | ICD-10-CM | POA: Diagnosis not present

## 2015-09-26 DIAGNOSIS — K922 Gastrointestinal hemorrhage, unspecified: Secondary | ICD-10-CM | POA: Diagnosis present

## 2015-09-26 DIAGNOSIS — R079 Chest pain, unspecified: Secondary | ICD-10-CM | POA: Insufficient documentation

## 2015-09-26 DIAGNOSIS — Z7983 Long term (current) use of bisphosphonates: Secondary | ICD-10-CM | POA: Diagnosis not present

## 2015-09-26 DIAGNOSIS — Z90710 Acquired absence of both cervix and uterus: Secondary | ICD-10-CM | POA: Diagnosis not present

## 2015-09-26 DIAGNOSIS — Z9049 Acquired absence of other specified parts of digestive tract: Secondary | ICD-10-CM | POA: Diagnosis not present

## 2015-09-26 DIAGNOSIS — E785 Hyperlipidemia, unspecified: Secondary | ICD-10-CM | POA: Insufficient documentation

## 2015-09-26 DIAGNOSIS — Z87891 Personal history of nicotine dependence: Secondary | ICD-10-CM | POA: Insufficient documentation

## 2015-09-26 DIAGNOSIS — Z853 Personal history of malignant neoplasm of breast: Secondary | ICD-10-CM | POA: Insufficient documentation

## 2015-09-26 DIAGNOSIS — K625 Hemorrhage of anus and rectum: Principal | ICD-10-CM | POA: Insufficient documentation

## 2015-09-26 DIAGNOSIS — Z66 Do not resuscitate: Secondary | ICD-10-CM | POA: Diagnosis not present

## 2015-09-26 DIAGNOSIS — I5032 Chronic diastolic (congestive) heart failure: Secondary | ICD-10-CM | POA: Diagnosis not present

## 2015-09-26 DIAGNOSIS — Z9689 Presence of other specified functional implants: Secondary | ICD-10-CM | POA: Diagnosis present

## 2015-09-26 DIAGNOSIS — Z86711 Personal history of pulmonary embolism: Secondary | ICD-10-CM | POA: Insufficient documentation

## 2015-09-26 DIAGNOSIS — Z79899 Other long term (current) drug therapy: Secondary | ICD-10-CM | POA: Diagnosis not present

## 2015-09-26 LAB — CBC WITH DIFFERENTIAL/PLATELET
BASOS ABS: 0 10*3/uL (ref 0.0–0.1)
Basophils Relative: 0 %
Eosinophils Absolute: 0.2 10*3/uL (ref 0.0–0.7)
Eosinophils Relative: 3 %
HEMATOCRIT: 43.1 % (ref 36.0–46.0)
HEMOGLOBIN: 14.5 g/dL (ref 12.0–15.0)
LYMPHS ABS: 1.9 10*3/uL (ref 0.7–4.0)
Lymphocytes Relative: 25 %
MCH: 29.7 pg (ref 26.0–34.0)
MCHC: 33.6 g/dL (ref 30.0–36.0)
MCV: 88.1 fL (ref 78.0–100.0)
Monocytes Absolute: 0.6 10*3/uL (ref 0.1–1.0)
Monocytes Relative: 8 %
NEUTROS ABS: 4.8 10*3/uL (ref 1.7–7.7)
Neutrophils Relative %: 64 %
Platelets: 312 10*3/uL (ref 150–400)
RBC: 4.89 MIL/uL (ref 3.87–5.11)
RDW: 13.5 % (ref 11.5–15.5)
WBC: 7.5 10*3/uL (ref 4.0–10.5)

## 2015-09-26 LAB — COMPREHENSIVE METABOLIC PANEL
ALT: 20 U/L (ref 14–54)
AST: 27 U/L (ref 15–41)
Albumin: 4.5 g/dL (ref 3.5–5.0)
Alkaline Phosphatase: 108 U/L (ref 38–126)
Anion gap: 13 (ref 5–15)
BILIRUBIN TOTAL: 0.7 mg/dL (ref 0.3–1.2)
BUN: 15 mg/dL (ref 6–20)
CHLORIDE: 97 mmol/L — AB (ref 101–111)
CO2: 29 mmol/L (ref 22–32)
CREATININE: 0.89 mg/dL (ref 0.44–1.00)
Calcium: 10 mg/dL (ref 8.9–10.3)
GFR calc Af Amer: >60 mL/min (ref >60)
GFR calc non Af Amer: >60 mL/min (ref >60)
Glucose, Bld: 87 mg/dL (ref 65–99)
Potassium: 3.3 mmol/L — ABNORMAL LOW (ref 3.5–5.1)
Sodium: 139 mmol/L (ref 135–145)
Total Protein: 8 g/dL (ref 6.5–8.1)

## 2015-09-26 LAB — POC OCCULT BLOOD, ED
FECAL OCCULT BLD: NEGATIVE
Fecal Occult Bld: POSITIVE — AB

## 2015-09-26 LAB — TYPE AND SCREEN
ABO/RH(D): A POS
Antibody Screen: NEGATIVE

## 2015-09-26 LAB — ABO/RH: ABO/RH(D): A POS

## 2015-09-26 LAB — PROTIME-INR
INR: 1 (ref 0.00–1.49)
PROTHROMBIN TIME: 13 s (ref 11.6–15.2)

## 2015-09-26 MED ORDER — ALBUTEROL SULFATE (2.5 MG/3ML) 0.083% IN NEBU: 2.5000 mg | INHALATION_SOLUTION | RESPIRATORY_TRACT | Status: DC | PRN

## 2015-09-26 MED ORDER — GABAPENTIN 400 MG PO CAPS
400.0000 mg | ORAL_CAPSULE | Freq: Three times a day (TID) | ORAL | Status: DC
  Administered 2015-09-27 (×2): 400 mg via ORAL
  Filled 2015-09-26 (×3): qty 1

## 2015-09-26 MED ORDER — ACETAMINOPHEN 650 MG RE SUPP: 650.0000 mg | Freq: Four times a day (QID) | RECTAL | Status: DC | PRN

## 2015-09-26 MED ORDER — PANTOPRAZOLE SODIUM 40 MG IV SOLR
40.0000 mg | Freq: Two times a day (BID) | INTRAVENOUS | Status: DC
  Administered 2015-09-27 (×2): 40 mg via INTRAVENOUS
  Filled 2015-09-26 (×2): qty 40

## 2015-09-26 MED ORDER — ONDANSETRON HCL 4 MG PO TABS: 4.0000 mg | ORAL_TABLET | Freq: Four times a day (QID) | ORAL | Status: DC | PRN

## 2015-09-26 MED ORDER — ACETAMINOPHEN 325 MG PO TABS
650.0000 mg | ORAL_TABLET | Freq: Four times a day (QID) | ORAL | Status: DC | PRN
Start: 2015-09-26 — End: 2015-09-27

## 2015-09-26 MED ORDER — SIMVASTATIN 40 MG PO TABS
40.0000 mg | ORAL_TABLET | Freq: Every day | ORAL | Status: DC
  Administered 2015-09-27: 40 mg via ORAL
  Filled 2015-09-26: qty 1

## 2015-09-26 MED ORDER — CARVEDILOL 6.25 MG PO TABS
6.2500 mg | ORAL_TABLET | Freq: Two times a day (BID) | ORAL | Status: DC
  Administered 2015-09-27 (×2): 6.25 mg via ORAL
  Filled 2015-09-26 (×3): qty 1

## 2015-09-26 MED ORDER — SODIUM CHLORIDE 0.9 % IV BOLUS (SEPSIS)
500.0000 mL | Freq: Once | INTRAVENOUS | Status: AC
  Administered 2015-09-26: 500 mL via INTRAVENOUS

## 2015-09-26 MED ORDER — VENLAFAXINE HCL ER 150 MG PO CP24
150.0000 mg | ORAL_CAPSULE | Freq: Every day | ORAL | Status: DC
  Administered 2015-09-27: 150 mg via ORAL
  Filled 2015-09-26: qty 1

## 2015-09-26 MED ORDER — POTASSIUM CHLORIDE IN NACL 20-0.9 MEQ/L-% IV SOLN
INTRAVENOUS | Status: DC
  Administered 2015-09-27: via INTRAVENOUS
  Filled 2015-09-26 (×2): qty 1000

## 2015-09-26 MED ORDER — ONDANSETRON HCL 4 MG/2ML IJ SOLN: 4.0000 mg | Freq: Four times a day (QID) | INTRAMUSCULAR | Status: DC | PRN

## 2015-09-26 NOTE — ED Notes (Signed)
Patient here from home with complaints of rectal bleeding increased today. Reports constant bright red blood from rectum.

## 2015-09-26 NOTE — ED Provider Notes (Signed)
CSN: [id number]     Arrival date & time 09/26/15  1832 History   First MD Initiated Contact with Patient 09/26/15 1929     Chief Complaint  Patient presents with  . Rectal Bleeding     (Consider location/radiation/quality/duration/timing/severity/associated sxs/prior Treatment) The history is provided by the patient and a relative.  Jersey Garbutt is a 75 y.o. female hx of breast cancer in remission, CHF, here with bright red blood per rectum. Patient states that she usually has some blood when she wipes at baseline. Today she was sitting in her nightgown and had acute onset of blood from her rectum that soaks through the night gown and went on to the sofa. She was on Xarelto before for PE but has been off of it. Denies passing out. Denies abdominal pain or chest pain.    Past Medical History  Diagnosis Date  . Cancer [facility])     breast CA 2002  . CHF (congestive heart failure) ([place])   . Arthritis    Past Surgical History  Procedure Laterality Date  . Abdominal hysterectomy    . Appendectomy    . Breast surgery    . Back surgery     Family History  Problem Relation Age of Onset  . Stroke Father   . Diabetes Mellitus I Son   . Leukemia Son    Social History  Substance Use Topics  . Smoking status: Former Research scientist (life sciences)  . Smokeless tobacco: None  . Alcohol Use: No   OB History    No data available     Review of Systems  Gastrointestinal: Positive for blood in stool and hematochezia.  All other systems reviewed and are negative.     Allergies  Review of patient's allergies indicates no known allergies.  Home Medications   Prior to Admission medications   Medication Sig Start Date End Date Taking? Authorizing Provider  Calcium Citrate-Vitamin D (CALCITRATE/VITAMIN D PO) Take 5 mLs by mouth daily.   Yes Historical Provider, MD  carvedilol (COREG) 6.25 MG tablet Take 1 tablet (6.25 mg total) by mouth 2 (two) times daily with a meal. 07/14/13  Yes Velvet Bathe, MD  furosemide  (LASIX) 20 MG tablet Take 20 mg by mouth daily.    Yes Historical Provider, MD  gabapentin (NEURONTIN) 400 MG capsule Take 400 mg by mouth 3 (three) times daily.  08/31/15  Yes Historical Provider, MD  Multiple Vitamin (MULTIVITAMIN WITH MINERALS) TABS tablet Take 2 tablets by mouth 3 (three) times daily.   Yes Historical Provider, MD  potassium chloride SA (K-DUR,KLOR-CON) 20 MEQ tablet Take 20 mEq by mouth daily.  08/31/15  Yes Historical Provider, MD  simvastatin (ZOCOR) 40 MG tablet Take 40 mg by mouth at bedtime.  09/24/15  Yes Historical Provider, MD  venlafaxine XR (EFFEXOR-XR) 150 MG 24 hr capsule Take 150 mg by mouth daily with breakfast.   Yes Historical Provider, MD  albuterol (PROVENTIL) (2.5 MG/3ML) 0.083% nebulizer solution Take 3 mLs (2.5 mg total) by nebulization every 2 (two) hours as needed for wheezing or shortness of breath. 07/14/13   Velvet Bathe, MD  alendronate (FOSAMAX) 70 MG tablet Take 70 mg by mouth once a week.  07/24/15   Historical Provider, MD  fluticasone (FLOVENT HFA) 44 MCG/ACT inhaler Inhale 2 puffs into the lungs 2 (two) times daily. Patient not taking: Reported on 09/26/2015 07/14/13   Velvet Bathe, MD  gabapentin (NEURONTIN) 100 MG capsule Take 1 capsule (100 mg total) by mouth 3 (three) times  daily. Patient not taking: Reported on 09/26/2015 07/14/13   Velvet Bathe, MD  lisinopril (PRINIVIL,ZESTRIL) 2.5 MG tablet Take 1 tablet (2.5 mg total) by mouth daily. Patient not taking: Reported on 09/26/2015 07/14/13   Velvet Bathe, MD  predniSONE (STERAPRED UNI-PAK) 10 MG tablet Take by mouth daily. Take 50 mg by mouth daily for the next 2 days, then take 40 mg by mouth daily for the next 2 days, then take 30 mg by mouth daily for the next 2 days then take 20 mg daily for the next 2 days then take 10 mg daily for the next 2 days. Patient not taking: Reported on 09/26/2015 07/14/13   Velvet Bathe, MD  Rivaroxaban (XARELTO) 15 MG TABS tablet Take 1 tablet (15 mg total) by mouth 2 (two)  times daily with a meal. Patient not taking: Reported on 09/26/2015 07/14/13   Velvet Bathe, MD  tiotropium (SPIRIVA) 18 MCG inhalation capsule Place 1 capsule (18 mcg total) into inhaler and inhale daily. Patient not taking: Reported on 09/26/2015 07/14/13   Velvet Bathe, MD  zolpidem (AMBIEN) 5 MG tablet Take 1 tablet (5 mg total) by mouth at bedtime as needed for sleep. Patient not taking: Reported on 09/26/2015 07/14/13   Velvet Bathe, MD   BP 131/81 mmHg  Pulse 100  Temp(Src) 98.5 F (36.9 C) (Oral)  Resp 18  Ht 5' (1.524 m)  Wt 125 lb (56.7 kg)  BMI 24.41 kg/m2  SpO2 99% Physical Exam  Constitutional: She is oriented to person, place, and time. She appears well-developed.  Chronically ill, NAD   HENT:  Head: Normocephalic.  Mouth/Throat: Oropharynx is clear and moist.  Eyes: Conjunctivae are normal. Pupils are equal, round, and reactive to light.  Neck: Normal range of motion. Neck supple.  Cardiovascular: Normal rate, regular rhythm and normal heart sounds.   Pulmonary/Chest: Effort normal and breath sounds normal. No respiratory distress. She has no wheezes. She has no rales.  Abdominal: Soft. Bowel sounds are normal. She exhibits no distension. There is no tenderness. There is no rebound.  Genitourinary:  Rectal- grossly positive, no obvious hemorrhoids   Musculoskeletal: Normal range of motion.  Neurological: She is alert and oriented to person, place, and time. No cranial nerve deficit. Coordination normal.  Skin: Skin is warm and dry.  Psychiatric: She has a normal mood and affect. Her behavior is normal. Judgment and thought content normal.  Nursing note and vitals reviewed.   ED Course  Procedures (including critical care time) Labs Review Labs Reviewed  COMPREHENSIVE METABOLIC PANEL - Abnormal; Notable for the following:    Potassium 3.3 (*)    Chloride 97 (*)    All other components within normal limits  POC OCCULT BLOOD, ED - Abnormal; Notable for the following:     Fecal Occult Bld POSITIVE (*)    All other components within normal limits  CBC WITH DIFFERENTIAL/PLATELET  PROTIME-INR  POC OCCULT BLOOD, ED  TYPE AND SCREEN    Imaging Review No results found. I have personally reviewed and evaluated these images and lab results as part of my medical decision-making.   EKG Interpretation None      MDM   Final diagnoses:  None   Sanay Mahin is a 75 y.o. female here with BRPBR. Likely diverticulosis. Will get labs, guiac. Will likely need admission for observation.   10:01 PM Occ positive. Hg 14. Consulted Dr. Fuller Plan, who will consult. Will admit for observation overnight.     Wandra Arthurs, MD  09/26/15 2201 

## 2015-09-26 NOTE — ED Notes (Signed)
Pt's Occult blood results are positive

## 2015-09-26 NOTE — H&P (Signed)
History and Physical    [name] [id number] DOB: 12/09/1940 DOA: 09/26/2015  Referring MD/NP/PA: Dr. Darl Householder. PCP: [name], MD  Outpatient Specialists: None. Patient coming from: Home.  Chief Complaint: Rectal bleeding.  HPI: [name] is a 75 y.o. female with medical history significant of with history of CHF, hyperlipidemia, breast cancer in remission and previous history of PE presents to the ER after patient had an episode of rectal bleeding. Patient states over the last few months patient has been noticing some blood after moving bowels and wipes. Last evening patient started noticing rectal bleeding moving down her leg. Only happened once. Patient denies any associated chest pain or short of breath and dizziness. Hemoglobin appears stable normal vital signs in the ER. ER physician and discuss with gastroenterologist Dr. Fuller Plan will be seeing patient in consult. Patient at this time is not on any anticoagulants. While waiting in the ER patient had a brief episode of chest pressure retrosternal nonradiating present even at rest and lasted for around 3 minutes and resolved without any intervention.   ED Course: See history of present illness.  Review of Systems: As per HPI otherwise 10 point review of systems negative.    Past Medical History  Diagnosis Date  . Cancer [facility])     breast CA 2002  . CHF (congestive heart failure) ([place])   . Arthritis     Past Surgical History  Procedure Laterality Date  . Abdominal hysterectomy    . Appendectomy    . Breast surgery    . Back surgery       reports that she has quit smoking. She does not have any smokeless tobacco history on file. She reports that she does not drink alcohol or use illicit drugs.  No Known Allergies  Family History  Problem Relation Age of Onset  . Stroke Father   . Diabetes Mellitus I Son   . Leukemia Son     Prior to Admission medications   Medication Sig Start Date End Date Taking?  Authorizing Provider  Calcium Citrate-Vitamin D (CALCITRATE/VITAMIN D PO) Take 5 mLs by mouth daily.   Yes Historical Provider, MD  carvedilol (COREG) 6.25 MG tablet Take 1 tablet (6.25 mg total) by mouth 2 (two) times daily with a meal. 07/14/13  Yes Velvet Bathe, MD  furosemide (LASIX) 20 MG tablet Take 20 mg by mouth daily.    Yes Historical Provider, MD  gabapentin (NEURONTIN) 400 MG capsule Take 400 mg by mouth 3 (three) times daily.  08/31/15  Yes Historical Provider, MD  Multiple Vitamin (MULTIVITAMIN WITH MINERALS) TABS tablet Take 2 tablets by mouth 3 (three) times daily.   Yes Historical Provider, MD  potassium chloride SA (K-DUR,KLOR-CON) 20 MEQ tablet Take 20 mEq by mouth daily.  08/31/15  Yes Historical Provider, MD  simvastatin (ZOCOR) 40 MG tablet Take 40 mg by mouth at bedtime.  09/24/15  Yes Historical Provider, MD  venlafaxine XR (EFFEXOR-XR) 150 MG 24 hr capsule Take 150 mg by mouth daily with breakfast.   Yes Historical Provider, MD  albuterol (PROVENTIL) (2.5 MG/3ML) 0.083% nebulizer solution Take 3 mLs (2.5 mg total) by nebulization every 2 (two) hours as needed for wheezing or shortness of breath. 07/14/13   Velvet Bathe, MD  alendronate (FOSAMAX) 70 MG tablet Take 70 mg by mouth once a week.  07/24/15   Historical Provider, MD  fluticasone (FLOVENT HFA) 44 MCG/ACT inhaler Inhale 2 puffs into the lungs 2 (two) times daily. Patient not taking:  Reported on 09/26/2015 07/14/13   Velvet Bathe, MD  gabapentin (NEURONTIN) 100 MG capsule Take 1 capsule (100 mg total) by mouth 3 (three) times daily. Patient not taking: Reported on 09/26/2015 07/14/13   Velvet Bathe, MD  lisinopril (PRINIVIL,ZESTRIL) 2.5 MG tablet Take 1 tablet (2.5 mg total) by mouth daily. Patient not taking: Reported on 09/26/2015 07/14/13   Velvet Bathe, MD  predniSONE (STERAPRED UNI-PAK) 10 MG tablet Take by mouth daily. Take 50 mg by mouth daily for the next 2 days, then take 40 mg by mouth daily for the next 2 days, then take 30  mg by mouth daily for the next 2 days then take 20 mg daily for the next 2 days then take 10 mg daily for the next 2 days. Patient not taking: Reported on 09/26/2015 07/14/13   Velvet Bathe, MD  Rivaroxaban (XARELTO) 15 MG TABS tablet Take 1 tablet (15 mg total) by mouth 2 (two) times daily with a meal. Patient not taking: Reported on 09/26/2015 07/14/13   Velvet Bathe, MD  tiotropium (SPIRIVA) 18 MCG inhalation capsule Place 1 capsule (18 mcg total) into inhaler and inhale daily. Patient not taking: Reported on 09/26/2015 07/14/13   Velvet Bathe, MD  zolpidem (AMBIEN) 5 MG tablet Take 1 tablet (5 mg total) by mouth at bedtime as needed for sleep. Patient not taking: Reported on 09/26/2015 07/14/13   Velvet Bathe, MD    Physical Exam: Filed Vitals:   09/26/15 1847 09/26/15 2206  BP: 131/81 134/76  Pulse: 100 84  Temp: 98.5 F (36.9 C)   TempSrc: Oral   Resp: 18 17  Height: 5' (1.524 m)   Weight: 125 lb (56.7 kg)   SpO2: 99% 96%      Constitutional: Appears normal. Filed Vitals:   09/26/15 1847 09/26/15 2206  BP: 131/81 134/76  Pulse: 100 84  Temp: 98.5 F (36.9 C)   TempSrc: Oral   Resp: 18 17  Height: 5' (1.524 m)   Weight: 125 lb (56.7 kg)   SpO2: 99% 96%   Eyes: Anicteric no pallor. ENMT: No discharge from the ears eyes nose or mouth. Neck: No mass felt. No neck rigidity. Respiratory: Bilateral air entry present no rhonchi or crepitation. Cardiovascular: S1-S2 heard. Abdomen: Soft nontender bowel sounds present. No guarding or rigidity. Musculoskeletal: No edema. Skin: No rash. Neurologic: Alert awake oriented to time place and person. Moves all extremities. Psychiatric: Appears normal.   Labs on Admission: I have personally reviewed following labs and imaging studies  CBC:  Recent Labs Lab 09/26/15 2007  WBC 7.5  NEUTROABS 4.8  HGB 14.5  HCT 43.1  MCV 88.1  PLT 123456   Basic Metabolic Panel:  Recent Labs Lab 09/26/15 2007  NA 139  K 3.3*  CL 97*  CO2 29    GLUCOSE 87  BUN 15  CREATININE 0.89  CALCIUM 10.0   GFR: Estimated Creatinine Clearance: 43.8 mL/min (by C-G formula based on Cr of 0.89). Liver Function Tests:  Recent Labs Lab 09/26/15 2007  AST 27  ALT 20  ALKPHOS 108  BILITOT 0.7  PROT 8.0  ALBUMIN 4.5   No results for input(s): LIPASE, AMYLASE in the last 168 hours. No results for input(s): AMMONIA in the last 168 hours. Coagulation Profile:  Recent Labs Lab 09/26/15 2007  INR 1.00   Cardiac Enzymes: No results for input(s): CKTOTAL, CKMB, CKMBINDEX, TROPONINI in the last 168 hours. BNP (last 3 results) No results for input(s): PROBNP in the last 8760  hours. HbA1C: No results for input(s): HGBA1C in the last 72 hours. CBG: No results for input(s): GLUCAP in the last 168 hours. Lipid Profile: No results for input(s): CHOL, HDL, LDLCALC, TRIG, CHOLHDL, LDLDIRECT in the last 72 hours. Thyroid Function Tests: No results for input(s): TSH, T4TOTAL, FREET4, T3FREE, THYROIDAB in the last 72 hours. Anemia Panel: No results for input(s): VITAMINB12, FOLATE, FERRITIN, TIBC, IRON, RETICCTPCT in the last 72 hours. Urine analysis:    Component Value Date/Time   COLORURINE AMBER* 07/07/2013 2348   APPEARANCEUR CLOUDY* 07/07/2013 2348   LABSPEC 1.023 07/07/2013 2348   PHURINE 5.0 07/07/2013 2348   GLUCOSEU NEGATIVE 07/07/2013 2348   HGBUR NEGATIVE 07/07/2013 2348   BILIRUBINUR NEGATIVE 07/07/2013 2348   KETONESUR 15* 07/07/2013 2348   PROTEINUR 30* 07/07/2013 2348   UROBILINOGEN 0.2 07/07/2013 2348   NITRITE NEGATIVE 07/07/2013 2348   LEUKOCYTESUR NEGATIVE 07/07/2013 2348   Sepsis Labs: @LABRCNTIP (procalcitonin:4,lacticidven:4) )No results found for this or any previous visit (from the past 240 hour(s)).   Radiological Exams on Admission: No results found.  EKG: Independently reviewed. Normal sinus rhythm with nonspecific T-wave changes.  Assessment/Plan Principal Problem:   Acute GI bleeding Active  Problems:   History of breast cancer   Chest pain   Chronic diastolic CHF (congestive heart failure) (HCC)   HLD (hyperlipidemia)   GI bleeding    #1. Rectal bleeding - suspect patient had a diverticular bleed. Patient denies any abdominal pain and denies taking any NSAIDs or aspirin. Patient states she has had a colonoscopy. Surgical results of which are not available. Dr. Fuller Plan on call gastroenterologist was consulted by ER physician and will be seeing patient in consult. We will closely follow CBC and keep patient nothing by mouth. #2. Chest pain - patient had a brief episode of chest pain. Chest x-ray is pending at this time. EKG shows normal sinus rhythm with nonspecific ST changes. We will cycle cardiac markers and since patient also had a history of PE check d-dimer. #3. History of diastolic CHF last EF measured was 55-60% in 2015 - will hold Lasix for now since patient has acute GI bleed. #4. Hyperlipidemia on statin. #5. History of breast cancer in remission.   DVT prophylaxis: SCDs. Code Status: DO NOT RESUSCITATE.  Family Communication: No family at the bedside.  Disposition Plan: Back to her living facility.  Consults called: Dr. Fuller Plan gastroenterologist by ER physician.  Admission status: Observation.    [name] MD [facility] Pager [phone].  If 7PM-7AM, please contact night-coverage [email or web address] Password [email or web address]  09/26/2015, 10:56 PM

## 2015-09-27 ENCOUNTER — Encounter (HOSPITAL_COMMUNITY): Payer: Self-pay | Admitting: Radiology

## 2015-09-27 ENCOUNTER — Observation Stay (HOSPITAL_COMMUNITY): Payer: Medicare Other

## 2015-09-27 DIAGNOSIS — E785 Hyperlipidemia, unspecified: Secondary | ICD-10-CM

## 2015-09-27 DIAGNOSIS — I5032 Chronic diastolic (congestive) heart failure: Secondary | ICD-10-CM | POA: Diagnosis not present

## 2015-09-27 DIAGNOSIS — R079 Chest pain, unspecified: Secondary | ICD-10-CM | POA: Diagnosis not present

## 2015-09-27 DIAGNOSIS — Z853 Personal history of malignant neoplasm of breast: Secondary | ICD-10-CM | POA: Diagnosis not present

## 2015-09-27 DIAGNOSIS — K922 Gastrointestinal hemorrhage, unspecified: Secondary | ICD-10-CM | POA: Diagnosis not present

## 2015-09-27 LAB — COMPREHENSIVE METABOLIC PANEL
ALBUMIN: 3.1 g/dL — AB (ref 3.5–5.0)
ALK PHOS: 74 U/L (ref 38–126)
ALT: 16 U/L (ref 14–54)
AST: 21 U/L (ref 15–41)
Anion gap: 9 (ref 5–15)
BILIRUBIN TOTAL: 0.6 mg/dL (ref 0.3–1.2)
BUN: 12 mg/dL (ref 6–20)
CO2: 25 mmol/L (ref 22–32)
CREATININE: 0.76 mg/dL (ref 0.44–1.00)
Calcium: 8.3 mg/dL — ABNORMAL LOW (ref 8.9–10.3)
Chloride: 104 mmol/L (ref 101–111)
GFR calc Af Amer: >60 mL/min (ref >60)
GLUCOSE: 106 mg/dL — AB (ref 65–99)
POTASSIUM: 3.6 mmol/L (ref 3.5–5.1)
Sodium: 138 mmol/L (ref 135–145)
TOTAL PROTEIN: 5.7 g/dL — AB (ref 6.5–8.1)

## 2015-09-27 LAB — CBC WITH DIFFERENTIAL/PLATELET
BASOS ABS: 0 10*3/uL (ref 0.0–0.1)
Basophils Relative: 1 %
EOS ABS: 0.2 10*3/uL (ref 0.0–0.7)
EOS PCT: 3 %
HCT: 34.2 % — ABNORMAL LOW (ref 36.0–46.0)
Hemoglobin: 11.4 g/dL — ABNORMAL LOW (ref 12.0–15.0)
LYMPHS PCT: 25 %
Lymphs Abs: 1.2 10*3/uL (ref 0.7–4.0)
MCH: 29.3 pg (ref 26.0–34.0)
MCHC: 33.3 g/dL (ref 30.0–36.0)
MCV: 87.9 fL (ref 78.0–100.0)
MONO ABS: 0.4 10*3/uL (ref 0.1–1.0)
Monocytes Relative: 9 %
Neutro Abs: 2.9 10*3/uL (ref 1.7–7.7)
Neutrophils Relative %: 62 %
PLATELETS: 222 10*3/uL (ref 150–400)
RBC: 3.89 MIL/uL (ref 3.87–5.11)
RDW: 13.6 % (ref 11.5–15.5)
WBC: 4.7 10*3/uL (ref 4.0–10.5)

## 2015-09-27 LAB — CBC
HCT: 36.8 % (ref 36.0–46.0)
HEMATOCRIT: 37.5 % (ref 36.0–46.0)
HEMOGLOBIN: 12.1 g/dL (ref 12.0–15.0)
Hemoglobin: 12.5 g/dL (ref 12.0–15.0)
MCH: 29.1 pg (ref 26.0–34.0)
MCH: 28.8 pg (ref 26.0–34.0)
MCHC: 33.3 g/dL (ref 30.0–36.0)
MCHC: 32.9 g/dL (ref 30.0–36.0)
MCV: 87.4 fL (ref 78.0–100.0)
MCV: 87.6 fL (ref 78.0–100.0)
PLATELETS: 249 10*3/uL (ref 150–400)
Platelets: 259 10*3/uL (ref 150–400)
RBC: 4.29 MIL/uL (ref 3.87–5.11)
RBC: 4.2 MIL/uL (ref 3.87–5.11)
RDW: 13.6 % (ref 11.5–15.5)
RDW: 13.6 % (ref 11.5–15.5)
WBC: 6.6 10*3/uL (ref 4.0–10.5)
WBC: 5.6 10*3/uL (ref 4.0–10.5)

## 2015-09-27 LAB — D-DIMER, QUANTITATIVE: D-Dimer, Quant: 1 ug/mL-FEU — ABNORMAL HIGH (ref 0.00–0.50)

## 2015-09-27 LAB — TROPONIN I
Troponin I: <0.03 ng/mL (ref <0.031)
Troponin I: <0.03 ng/mL (ref <0.031)
Troponin I: <0.03 ng/mL (ref <0.031)

## 2015-09-27 LAB — GLUCOSE, CAPILLARY: GLUCOSE-CAPILLARY: 97 mg/dL (ref 65–99)

## 2015-09-27 MED ORDER — IOPAMIDOL (ISOVUE-370) INJECTION 76%
100.0000 mL | Freq: Once | INTRAVENOUS | Status: AC | PRN
  Administered 2015-09-27: 100 mL via INTRAVENOUS

## 2015-09-27 MED ORDER — IOPAMIDOL (ISOVUE-300) INJECTION 61%: 100.0000 mL | Freq: Once | INTRAVENOUS | Status: DC | PRN

## 2015-09-27 NOTE — Discharge Summary (Signed)
Physician Discharge Summary  [name] [id number] DOB: May 07, 1941 DOA: 09/26/2015  PCP: Mary Pepper, MD  Admit date: 09/26/2015 Discharge date: 09/27/2015  Time spent: 45 minutes  Recommendations for Outpatient Follow-up:  Patient will be discharged to home.  Patient will need to follow up with primary care provider within one week of discharge.  Follow up with [name], gastroenterology, in 1-2 weeks.  Patient should continue medications as prescribed.  Patient should follow a heart healthy diet.   Discharge Diagnoses:  Rectal bleeding Chest pain Chronic diastolic heart failure  Hyperlipidemia History of breast cancer  Discharge Condition: Stable  Diet recommendation: Heart healthy  Filed Weights   09/26/15 1847 09/26/15 2338 09/27/15 0603  Weight: 56.7 kg (125 lb) 59.4 kg (130 lb 15.3 oz) 59.4 kg (130 lb 15.3 oz)    History of present illness:  On 09/25/2012 by [name] is a 75 y.o. female with medical history significant of with history of CHF, hyperlipidemia, breast cancer in remission and previous history of PE presents to the ER after patient had an episode of rectal bleeding. Patient states over the last few months patient has been noticing some blood after moving bowels and wipes. Last evening patient started noticing rectal bleeding moving down her leg. Only happened once. Patient denies any associated chest pain or short of breath and dizziness. Hemoglobin appears stable normal vital signs in the ER. ER physician and discuss with gastroenterologist [name] will be seeing patient in consult. Patient at this time is not on any anticoagulants. While waiting in the ER patient had a brief episode of chest pressure retrosternal nonradiating present even at rest and lasted for around 3 minutes and resolved without any intervention.   Hospital Course:  Rectal bleeding -Possibly due to fissure bleeding versus hemorrhoidal. -Patient had 2 FOBT's, 1  was -1 was positive. -During rectal exam I performed, there was no blood noted on glove. -Hemoglobin has remained stable. Currently 12.1. -Patient stated she only noticed blood on her toilet paper when she wiped. She denied having to strain during her bowel movement. -Spoke to gastroenterology, [name], via phone, suggested outpatient follow-up with [name].  Chest pain/elevated d-dimer -Troponin cycled and negative 3  -No complete a further chest pain  -D-dimer noted to be 1, CTA chest showed no evidence of acute pulmonary thromboembolism.   Chronic diastolic heart failure  -Currently euvolemic and compensated -Echocardiogram 07/08/2013 showed EF of 55-60%, left ventricular diastolic function was normal at that time  -Lasix held due to questionable GI bleed and concern for possible hypotension. -Restart Lasix upon discharge  Hyperlipidemia -Continue statin  History of breast cancer -Currently in remission, follow-up with oncology   CODE STATUS: DO NOT RESUSCITATE  Procedures: None  Consultations: Gastroenterology, via phone, [name]  Discharge Exam: Filed Vitals:   09/27/15 0902 09/27/15 1412  BP: 131/75 119/69  Pulse: 65 81  Temp:  98 F (36.7 C)  Resp:  17     General: Well developed, well nourished, NAD, appears stated age  HEENT: NCAT,  mucous membranes moist.  Cardiovascular: S1 S2 auscultated, Regular rate and rhythm.  Respiratory: Clear to auscultation bilaterally  Abdomen: Soft, nontender, nondistended, + bowel sounds  Rectal: Good rectal tone, no overt bleeding noted  Extremities: warm dry without cyanosis clubbing or edema  Neuro: AAOx3, nonfocal  Psych: Normal affect and demeanor  Discharge Instructions      Discharge Instructions    Discharge instructions    Complete by:  As directed   Patient will be discharged to home.  Patient will need to follow up with primary care provider within one week of discharge.  Follow up with Dr.  Paulita Vaughn, gastroenterology, in 1-2 weeks.  Patient should continue medications as prescribed.  Patient should follow a heart healthy diet. *            Medication List    STOP taking these medications        fluticasone 44 MCG/ACT inhaler  Commonly known as:  FLOVENT HFA     predniSONE 10 MG tablet  Commonly known as:  STERAPRED UNI-PAK     Rivaroxaban 15 MG Tabs tablet  Commonly known as:  XARELTO     tiotropium 18 MCG inhalation capsule  Commonly known as:  SPIRIVA     zolpidem 5 MG tablet  Commonly known as:  AMBIEN      TAKE these medications        albuterol (2.5 MG/3ML) 0.083% nebulizer solution  Commonly known as:  PROVENTIL  Take 3 mLs (2.5 mg total) by nebulization every 2 (two) hours as needed for wheezing or shortness of breath.     alendronate 70 MG tablet  Commonly known as:  FOSAMAX  Take 70 mg by mouth once a week.     CALCITRATE/VITAMIN D PO  Take 5 mLs by mouth daily.     carvedilol 6.25 MG tablet  Commonly known as:  COREG  Take 1 tablet (6.25 mg total) by mouth 2 (two) times daily with a meal.     furosemide 20 MG tablet  Commonly known as:  LASIX  Take 20 mg by mouth daily.     gabapentin 400 MG capsule  Commonly known as:  NEURONTIN  Take 400 mg by mouth 3 (three) times daily.     lisinopril 2.5 MG tablet  Commonly known as:  PRINIVIL,ZESTRIL  Take 1 tablet (2.5 mg total) by mouth daily.     multivitamin with minerals Tabs tablet  Take 2 tablets by mouth 3 (three) times daily.     potassium chloride SA 20 MEQ tablet  Commonly known as:  K-DUR,KLOR-CON  Take 20 mEq by mouth daily.     simvastatin 40 MG tablet  Commonly known as:  ZOCOR  Take 40 mg by mouth at bedtime.     venlafaxine XR 150 MG 24 hr capsule  Commonly known as:  EFFEXOR-XR  Take 150 mg by mouth daily with breakfast.       No Known Allergies Follow-up Information    Follow up with Mary Pepper, MD. Schedule an appointment as soon as possible for a visit in 1  week.   Specialty:  Family Medicine   Why:  Hospital follow up   Contact information:   [address] [place] [address] [phone]       Follow up with Mary Dyke, MD. Schedule an appointment as soon as possible for a visit in 2 weeks.   Specialty:  Gastroenterology   Why:  Hospital follow up, rectal bleeding   Contact information:   [id number] [place] [address] [phone]        The results of significant diagnostics from this hospitalization (including imaging, microbiology, ancillary and laboratory) are listed below for reference.    Significant Diagnostic Studies: Ct Angio Chest Pe W/cm &/or Wo Cm  09/27/2015  CLINICAL DATA:  Sternal chest pain EXAM: CT ANGIOGRAPHY CHEST WITH CONTRAST TECHNIQUE: Multidetector CT  imaging of the chest was performed using the standard protocol during bolus administration of intravenous contrast. Multiplanar CT image reconstructions and MIPs were obtained to evaluate the vascular anatomy. CONTRAST:  100 cc Isovue 370 COMPARISON:  None. FINDINGS: No evidence of acute pulmonary thromboembolism. No evidence of aortic aneurysm or dissection. Three vessel coronary artery calcification. Mildly enlarged AP window lymph node on image 28 series 5 is stable dating back to 2015 No pneumothorax.  No pleural effusion. Right upper lobe calcified granuloma on image 25 of series 8. Emphysema. Dependent atelectasis bilaterally. Calcified granulomata are scattered throughout the spleen. No acute vertebral compression deformity.  No rib fracture. Review of the MIP images confirms the above findings. IMPRESSION: No evidence of acute pulmonary thromboembolism. Chronic changes. Electronically Signed   By: Marybelle Killings M.D.   On: 09/27/2015 07:47   Dg Chest Port 1 View  09/27/2015  CLINICAL DATA:  Chest pain.  Shortness of breath. EXAM: PORTABLE CHEST 1 VIEW COMPARISON:  07/11/2013. FINDINGS: Mediastinum hilar structures  normal. The lungs are clear of acute infiltrates. No pleural effusion pneumothorax. Calcified nodule right upper lobe consistent granuloma. Heart size normal. No acute bony abnormality. IMPRESSION: No acute cardiopulmonary disease.  Stable chest. Electronically Signed   By: Marcello Moores  Register   On: 09/27/2015 07:49    Microbiology: No results found for this or any previous visit (from the past 240 hour(s)).   Labs: Basic Metabolic Panel:  Recent Labs Lab 09/26/15 2007 09/27/15 0715  NA 139 138  K 3.3* 3.6  CL 97* 104  CO2 29 25  GLUCOSE 87 106*  BUN 15 12  CREATININE 0.89 0.76  CALCIUM 10.0 8.3*   Liver Function Tests:  Recent Labs Lab 09/26/15 2007 09/27/15 0715  AST 27 21  ALT 20 16  ALKPHOS 108 74  BILITOT 0.7 0.6  PROT 8.0 5.7*  ALBUMIN 4.5 3.1*   No results for input(s): LIPASE, AMYLASE in the last 168 hours. No results for input(s): AMMONIA in the last 168 hours. CBC:  Recent Labs Lab 09/26/15 2007 09/27/15 0100 09/27/15 0715 09/27/15 1315  WBC 7.5 6.6 4.7 5.6  NEUTROABS 4.8  --  2.9  --   HGB 14.5 12.5 11.4* 12.1  HCT 43.1 37.5 34.2* 36.8  MCV 88.1 87.4 87.9 87.6  PLT 312 249 222 259   Cardiac Enzymes:  Recent Labs Lab 09/27/15 0100 09/27/15 0715 09/27/15 1315  TROPONINI <0.03 <0.03 <0.03   BNP: BNP (last 3 results) No results for input(s): BNP in the last 8760 hours.  ProBNP (last 3 results) No results for input(s): PROBNP in the last 8760 hours.  CBG:  Recent Labs Lab 09/27/15 1208  GLUCAP 97       Signed:  Cristal Ford  [facility] 09/27/2015, 2:19 PM

## 2015-09-27 NOTE — Care Management Obs Status (Signed)
[facility] NOTIFICATION   Patient Details  Name: [name] MRN: [id number] Date of Birth: 06/19/1940   Medicare Observation Status Notification Given:  Yes    MahabirJuliann Pulse, RN 09/27/2015, 2:28 PM

## 2015-09-27 NOTE — Care Management Note (Signed)
Case Management Note  Patient Details  Name: [name] MRN: [id number] Date of Birth: 1940-12-10  Subjective/Objective:  75 y/o f admitted w/GIB. From [facility]. Per nsg no PT cons needed.                  Action/Plan:d/c plan home.   Expected Discharge Date:                  Expected Discharge Plan:  Home/Self Care  In-House Referral:     Discharge planning Services  CM Consult  Post Acute Care Choice:    Choice offered to:     DME Arranged:    DME Agency:     HH Arranged:    HH Agency:     Status of Service:  In process, will continue to follow  Medicare Important Message Given:    Date Medicare IM Given:    Medicare IM give by:    Date Additional Medicare IM Given:    Additional Medicare Important Message give by:     If discussed at [place] of Stay Meetings, dates discussed:    Additional Comments:  Dessa Phi, RN 09/27/2015, 10:51 AM

## 2015-09-27 NOTE — Progress Notes (Signed)
D-dimer 1.00. MD paged, new order for CT scan.

## 2015-12-03 ENCOUNTER — Other Ambulatory Visit: Payer: Self-pay | Admitting: General Practice

## 2015-12-03 DIAGNOSIS — R5381 Other malaise: Secondary | ICD-10-CM

## 2015-12-04 ENCOUNTER — Other Ambulatory Visit: Payer: Self-pay | Admitting: Family Medicine

## 2016-01-02 ENCOUNTER — Encounter: Payer: Self-pay | Admitting: Genetic Counselor

## 2016-01-10 ENCOUNTER — Other Ambulatory Visit: Payer: Self-pay | Admitting: Surgery

## 2016-01-10 DIAGNOSIS — C50312 Malignant neoplasm of lower-inner quadrant of left female breast: Secondary | ICD-10-CM

## 2016-01-14 NOTE — Progress Notes (Signed)
Cardiology Office Note   Date:  01/15/2016   ID:  [name], DOB Oct 23, 1940, MRN [id number]  PCP:  [name], MD  Cardiologist:   Jenkins Rouge, MD   Chief Complaint  Patient presents with  . New Evaluation    having some shortness of breath-with excertion, recentk dx for breast cancer      History of Present Illness: [name] is a 75 y.o. female who presents for preop clearance .  History of diastolic CHF.  Last echo  Done 07/08/13 reviewed EF 0000000 normal diastolic parameters estimated PA pressure 31 mmHg no significant valve disease.  Admitted hospital 09/26/15 rectal bleed. Had some chest pain with it R/O  CTA no PE No history of CAD History of breast cancer.  Will have left mastectomy with Dr Nedra Hai In September.    She has recently moved here from [place] living near son. Her memory is worse and this is evident On exam. She describes wearing oxygen for years at night but has no diagnosis of COPD, ILD or Chronic lung disease. Has not smoked in years. Has ? Reflux at night and coughs.   No chest pain, palpitations , edema or syncope    Past Medical History:  Diagnosis Date  . Arthritis   . Cancer [facility])    breast CA 2002  . CHF (congestive heart failure) ([place])     Past Surgical History:  Procedure Laterality Date  . ABDOMINAL HYSTERECTOMY    . APPENDECTOMY    . BACK SURGERY    . BREAST SURGERY       Current Outpatient Prescriptions  Medication Sig Dispense Refill  . alendronate (FOSAMAX) 70 MG tablet Take 70 mg by mouth once a week.     . Calcium Citrate-Vitamin D (CALCITRATE/VITAMIN D PO) Take 5 mLs by mouth daily.    . carvedilol (COREG) 6.25 MG tablet Take 1 tablet (6.25 mg total) by mouth 2 (two) times daily with a meal. 60 tablet 0  . gabapentin (NEURONTIN) 400 MG capsule Take 400 mg by mouth 3 (three) times daily.     Marland Kitchen lisinopril (PRINIVIL,ZESTRIL) 2.5 MG tablet Take 1 tablet (2.5 mg total) by mouth daily. 30 tablet 0  . Multiple  Vitamin (MULTIVITAMIN WITH MINERALS) TABS tablet Take 2 tablets by mouth 3 (three) times daily.    . potassium chloride SA (K-DUR,KLOR-CON) 20 MEQ tablet Take 20 mEq by mouth daily.     . simvastatin (ZOCOR) 40 MG tablet Take 40 mg by mouth at bedtime.     Marland Kitchen venlafaxine XR (EFFEXOR-XR) 150 MG 24 hr capsule Take 150 mg by mouth daily with breakfast.     No current facility-administered medications for this visit.     Allergies:   Review of patient's allergies indicates no known allergies.    Social History:  The patient  reports that she has quit smoking. She does not have any smokeless tobacco history on file. She reports that she does not drink alcohol or use drugs.   Family History:  The patient's family history includes Diabetes Mellitus I in her son; Leukemia in her son; Stroke in her father.    ROS:  Please see the history of present illness.   Otherwise, review of systems are positive for none.   All other systems are reviewed and negative.    PHYSICAL EXAM: VS:  BP 100/68 (BP Location: Left Arm, Patient Position: Sitting, Cuff Size: Normal)   Pulse 94   Ht 5\' 1"  (1.549  m)   Wt 130 lb 4 oz (59.1 kg)   SpO2 95%   BMI 24.61 kg/m  , BMI Body mass index is 24.61 kg/m. Affect appropriate Healthy:  appears stated age 7: normal Neck supple with no adenopathy JVP normal no bruits no thyromegaly Lungs clear with no wheezing and good diaphragmatic motion Heart:  S1/S2 no murmur, no rub, gallop or click PMI normal Abdomen: benighn, BS positve, no tenderness, no AAA no bruit.  No HSM or HJR Distal pulses intact with no bruits No edema Neuro non-focal Skin warm and dry No muscular weakness    EKG:   09/26/15  SR rate 81 nonspecific ST/T wave changes    Recent Labs: 09/27/2015: ALT 16; BUN 12; Creatinine, Ser 0.76; Hemoglobin 12.1; Platelets 259; Potassium 3.6; Sodium 138    Lipid Panel No results found for: CHOL, TRIG, HDL, CHOLHDL, VLDL, LDLCALC, LDLDIRECT    Wt  Readings from Last 3 Encounters:  01/15/16 130 lb 4 oz (59.1 kg)  09/27/15 130 lb 15.3 oz (59.4 kg)  07/14/13 130 lb 14.4 oz (59.4 kg)      Other studies Reviewed: Additional studies/ records that were reviewed today include: Notes [place] [facility]  Dr Rush Farmer Echo 2015  .    ASSESSMENT AND PLAN:  1.  Preop: my biggest concern is her pulmonary status. ? Chronic nightly reflux with some fibrosis. She has no wheezing/ crackles on exam. Should have PFTls and see pulmonary before surgery will  Try to expedite.  From cardiac perspective we will do echo to make sure systolic function still normal Assess diastolic parameters and r/o pulmonary hypertension.  She is clear to have surgery from a cardiac perspective  2. HTN: Well controlled.  Continue current medications and low sodium Dash type diet.    3. Chol:  Continue statin labs with primary   4. Diastolic CHF euvolemic no recent signs of CHF f/u echo    Current medicines are reviewed at length with the patient today.  The patient does not have concerns regarding medicines.  The following changes have been made:  no change  Labs/ tests ordered today include: PFT;s, Echo Refer to Pulmonary    Orders Placed This Encounter  Procedures  . Ambulatory referral to Pulmonology  . ECHOCARDIOGRAM COMPLETE  . Pulmonary function test     Disposition:   FU with me PRN      Signed, Jenkins Rouge, MD  01/15/2016 12:04 PM    [facility] [address], [place], [place]  [address] Phone: [phone]; Fax: [phone]

## 2016-01-15 ENCOUNTER — Telehealth: Payer: Self-pay

## 2016-01-15 ENCOUNTER — Encounter: Payer: Self-pay | Admitting: Cardiovascular Disease

## 2016-01-15 ENCOUNTER — Ambulatory Visit (INDEPENDENT_AMBULATORY_CARE_PROVIDER_SITE_OTHER): Payer: Medicare Other | Admitting: Cardiovascular Disease

## 2016-01-15 VITALS — BP 100/68 | HR 94 | Ht 61.0 in | Wt 130.2 lb

## 2016-01-15 DIAGNOSIS — Z01818 Encounter for other preprocedural examination: Secondary | ICD-10-CM | POA: Diagnosis not present

## 2016-01-15 DIAGNOSIS — R0600 Dyspnea, unspecified: Secondary | ICD-10-CM

## 2016-01-15 NOTE — Telephone Encounter (Signed)
Spoke with patient's son; he states the 1pm PFT at [facility] and 2:30pm consult with RA in [place] office is fine and will make sure his mother gets there. Nothing more needed at this time.   All appointments have been made.

## 2016-01-15 NOTE — Patient Instructions (Addendum)
Medication Instructions:  Your physician recommends that you continue on your current medications as directed. Please refer to the Current Medication list given to you today.  Labwork: NONE  Testing/Procedures: Your physician has requested that you have an echocardiogram. Echocardiography is a painless test that uses sound waves to create images of your heart. It provides your doctor with information about the size and shape of your heart and how well your heart's chambers and valves are working. This procedure takes approximately one hour. There are no restrictions for this procedure.  Your physician has recommended that you have a pulmonary function test. Pulmonary Function Tests are a group of tests that measure how well air moves in and out of your lungs.  Follow-Up: Your physician wants you to follow-up as needed with Dr. Johnsie Cancel.   You have been referred to a Pulmonologist.    If you need a refill on your cardiac medications before your next appointment, please call your pharmacy.

## 2016-01-15 NOTE — Telephone Encounter (Signed)
I can't tell that I have ever seen this woman before. She needs pulmonary preop clearance at request of Dr Johnsie Cancel, pending mastectomy for breast CA scheduled in September. She already has a PFT scheduled, but can be seen by any of our docs for the requested clearance evaluation. I am booked and will be on vacation next week. Please redirect referral to another doc. Thanks.

## 2016-01-15 NOTE — Telephone Encounter (Signed)
Called respiratory at [facility] to see if pt could do a PFT at the hospital and then be seen in [place] office for RA as his schedule in [place] is the only schedule with openings in the next week.  Called pt to see if she could do a PFT at [facility] at 1:00 on Thursday, and see RA in [place] at 2:30- states she has to call her son who will be driving her to see if this will work.    1:00 pft held at [facility] and 2:30 consult slot held on RA's [place] schedule for this.  Will await call back.

## 2016-01-15 NOTE — Telephone Encounter (Signed)
Katie and CY please advise if we can get this pt in with CY in the next week per Dr. Kyla Balzarine office.  thanks

## 2016-01-17 ENCOUNTER — Institutional Professional Consult (permissible substitution): Payer: Medicare Other | Admitting: Pulmonary Disease

## 2016-01-17 ENCOUNTER — Ambulatory Visit (HOSPITAL_COMMUNITY)
Admission: RE | Admit: 2016-01-17 | Discharge: 2016-01-17 | Disposition: A | Discharge status: Home / Self Care | Payer: Medicare Other | Source: Ambulatory Visit | Attending: Cardiovascular Disease | Admitting: Cardiovascular Disease

## 2016-01-17 DIAGNOSIS — R0600 Dyspnea, unspecified: Secondary | ICD-10-CM

## 2016-01-17 DIAGNOSIS — Z01818 Encounter for other preprocedural examination: Secondary | ICD-10-CM | POA: Diagnosis present

## 2016-01-17 LAB — PULMONARY FUNCTION TEST
DL/VA % PRED: 48 %
DL/VA: 2.13 ml/min/mmHg/L
DLCO unc % pred: 30 %
DLCO unc: 6.24 ml/min/mmHg
FEF 25-75 POST: 0.53 L/s
FEF 25-75 Pre: 0.45 L/sec
FEF2575-%Change-Post: 16 %
FEF2575-%PRED-POST: 35 %
FEF2575-%Pred-Pre: 30 %
FEV1-%CHANGE-POST: 4 %
FEV1-%PRED-PRE: 56 %
FEV1-%Pred-Post: 59 %
FEV1-PRE: 1.04 L
FEV1-Post: 1.09 L
FEV1FVC-%CHANGE-POST: -3 %
FEV1FVC-%PRED-PRE: 70 %
FEV6-%Change-Post: 6 %
FEV6-%Pred-Post: 86 %
FEV6-%Pred-Pre: 81 %
FEV6-Post: 2.02 L
FEV6-Pre: 1.89 L
FEV6FVC-%Change-Post: -1 %
FEV6FVC-%Pred-Post: 100 %
FEV6FVC-%Pred-Pre: 101 %
FVC-%Change-Post: 8 %
FVC-%PRED-POST: 86 %
FVC-%PRED-PRE: 79 %
FVC-POST: 2.12 L
FVC-PRE: 1.96 L
POST FEV1/FVC RATIO: 51 %
PRE FEV6/FVC RATIO: 97 %
Post FEV6/FVC ratio: 95 %
Pre FEV1/FVC ratio: 53 %
RV % pred: 336 %
RV: 7.2 L
TLC % pred: 196 %
TLC: 9.05 L

## 2016-01-17 MED ORDER — ALBUTEROL SULFATE (2.5 MG/3ML) 0.083% IN NEBU
2.5000 mg | INHALATION_SOLUTION | Freq: Once | RESPIRATORY_TRACT | Status: AC
  Administered 2016-01-17: 2.5 mg via RESPIRATORY_TRACT

## 2016-01-21 ENCOUNTER — Encounter: Payer: Self-pay | Admitting: Hematology and Oncology

## 2016-01-23 ENCOUNTER — Ambulatory Visit (HOSPITAL_COMMUNITY): Payer: Medicare Other | Attending: Cardiovascular Disease

## 2016-01-23 ENCOUNTER — Telehealth: Payer: Self-pay | Admitting: Hematology and Oncology

## 2016-01-23 ENCOUNTER — Encounter: Payer: Self-pay | Admitting: Hematology and Oncology

## 2016-01-23 ENCOUNTER — Other Ambulatory Visit: Payer: Self-pay

## 2016-01-23 DIAGNOSIS — I340 Nonrheumatic mitral (valve) insufficiency: Secondary | ICD-10-CM | POA: Diagnosis not present

## 2016-01-23 DIAGNOSIS — Z853 Personal history of malignant neoplasm of breast: Secondary | ICD-10-CM | POA: Insufficient documentation

## 2016-01-23 DIAGNOSIS — I5032 Chronic diastolic (congestive) heart failure: Secondary | ICD-10-CM | POA: Diagnosis not present

## 2016-01-23 DIAGNOSIS — I517 Cardiomegaly: Secondary | ICD-10-CM | POA: Insufficient documentation

## 2016-01-23 DIAGNOSIS — Z01818 Encounter for other preprocedural examination: Secondary | ICD-10-CM

## 2016-01-23 DIAGNOSIS — I071 Rheumatic tricuspid insufficiency: Secondary | ICD-10-CM | POA: Diagnosis not present

## 2016-01-23 DIAGNOSIS — R0600 Dyspnea, unspecified: Secondary | ICD-10-CM | POA: Insufficient documentation

## 2016-01-23 NOTE — Telephone Encounter (Signed)
Appt scheduled w/Gudena on 9/5 at 345pm. Pt aware to arrive 30 minutes early. Demographics has been verified. Letter to the referring and mailed to the pt.

## 2016-01-29 ENCOUNTER — Encounter: Payer: Self-pay | Admitting: Hematology and Oncology

## 2016-01-29 ENCOUNTER — Other Ambulatory Visit: Payer: Medicare Other

## 2016-01-29 ENCOUNTER — Ambulatory Visit (HOSPITAL_BASED_OUTPATIENT_CLINIC_OR_DEPARTMENT_OTHER): Payer: Medicare Other | Admitting: Hematology and Oncology

## 2016-01-29 ENCOUNTER — Encounter: Payer: Self-pay | Admitting: *Deleted

## 2016-01-29 DIAGNOSIS — C50312 Malignant neoplasm of lower-inner quadrant of left female breast: Secondary | ICD-10-CM | POA: Diagnosis not present

## 2016-01-29 MED ORDER — ANASTROZOLE 1 MG PO TABS: 1.0000 mg | ORAL_TABLET | Freq: Every day | ORAL | 1 refills | Status: DC

## 2016-01-29 NOTE — Assessment & Plan Note (Addendum)
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 After reviewing the pathology in detail, we proceeded to discuss the different treatment options between surgery, radiation, chemotherapy, antiestrogen therapies.  Recommendation: 1. Patient is in the process of obtaining clearance from pulmonary and cardiology. 2. So in the meantime I recommended neoadjuvant antiestrogen therapy with anastrozole 1 mg daily. 3. mastectomy +/- lymph node evaluation 4. Followed by adjuvant antiestrogen therapy with anastrozole versus tamoxifen depending on bone density test results.  Given her advanced age, I did not recommend systemic chemotherapy.  Anastrozole counseling: We discussed the risks and benefits of anti-estrogen therapy with aromatase inhibitors. These include but not limited to insomnia, hot flashes, mood changes, vaginal dryness, bone density loss, and weight gain. We strongly believe that the benefits far outweigh the risks. Patient understands these risks and consented to starting treatment.  I will obtain the results of bone density done by her primary care physician. Return to clinic one week after surgery to discuss final surgical results. 

## 2016-01-29 NOTE — Progress Notes (Signed)
[facility] CONSULT NOTE  Patient Care Team: [name], MD as PCP - General (Family Medicine)  CHIEF COMPLAINTS/PURPOSE OF CONSULTATION:  Newly diagnosed breast cancer  HISTORY OF PRESENTING ILLNESS:  [name] 75 y.o. female is here because of recent diagnosis of left breast cancer.Patient felt a nodule in the left medial aspect of the breast and subsequent undergone a mammogram. The mammogram revealed a 1.6 cm lesion in the upper quadrant of the left breast. She subsequently had ultrasound which revealed a 1.7 cm lesion highly suspicious for malignancy. 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 It was grade 3. Previously in 2000 she had a diagnosis of left breast cancer treated with lumpectomy radiation systemic chemotherapy. She does not know the stage of receptor status at that time. She mowed from [facility] to live closer with her son who lives in [place]. She is currently in an elderly assisted living facility. Er daughter-in-law accompanied her today and she is a breast cancer survivor who sees Dr. Jana Hakim. I reviewed her records extensively and collaborated the history with the patient.  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 Arthritis   . Cancer [facility])    breast CA  2002  . CHF (congestive heart failure) ([place])     SURGICAL HISTORY: Past Surgical History:  Procedure Laterality Date  . ABDOMINAL HYSTERECTOMY    . APPENDECTOMY    . BACK SURGERY    . BREAST SURGERY      SOCIAL HISTORY: Social History   Social History  . Marital status: Widowed    Spouse name: N/A  . Number of children: N/A  . Years of education: N/A   Occupational History  . Not on file.   Social History Main Topics  . Smoking status: Former Research scientist (life sciences)  . Smokeless tobacco: Not on file  . Alcohol use No  . Drug use: No  . Sexual activity: Not on file   Other Topics Concern  . Not on file   Social History Narrative  . No narrative on file    FAMILY HISTORY: Family History  Problem Relation Age of Onset  . Stroke Father   . Diabetes Mellitus I Son   . Leukemia Son     ALLERGIES:  has No Known Allergies.  MEDICATIONS:  Current Outpatient Prescriptions  Medication Sig Dispense Refill  . alendronate (FOSAMAX) 70 MG tablet Take 70 mg by mouth once a week.     Marland Kitchen anastrozole (ARIMIDEX) 1 MG tablet Take 1 tablet (1 mg total) by mouth daily. 30 tablet 1  . Calcium Citrate-Vitamin D (CALCITRATE/VITAMIN D PO) Take 5 mLs by mouth daily.    . carvedilol (COREG) 6.25 MG tablet Take 1 tablet (6.25 mg total) by mouth  2 (two) times daily with a meal. 60 tablet 0  . gabapentin (NEURONTIN) 400 MG capsule Take 400 mg by mouth 3 (three) times daily.     Marland Kitchen lisinopril (PRINIVIL,ZESTRIL) 2.5 MG tablet Take 1 tablet (2.5 mg total) by mouth daily. 30 tablet 0  . Multiple Vitamin (MULTIVITAMIN WITH MINERALS) TABS tablet Take 2 tablets by mouth 3 (three) times daily.    . potassium chloride SA (K-DUR,KLOR-CON) 20 MEQ tablet Take 20 mEq by mouth daily.     . simvastatin (ZOCOR) 40 MG tablet Take 40 mg by mouth at bedtime.     Marland Kitchen venlafaxine XR (EFFEXOR-XR) 150 MG 24 hr capsule Take 150 mg by mouth daily with breakfast.     No current facility-administered medications for this visit.      REVIEW OF SYSTEMS:   Constitutional: Denies fevers, chills or abnormal night sweats Eyes: Denies blurriness of vision, double vision or watery eyes Ears, nose, mouth, throat, and face: Denies mucositis or sore throat Respiratory: Denies cough, dyspnea or wheezes Cardiovascular: Denies palpitation, chest discomfort or lower extremity swelling Gastrointestinal:  Denies nausea, heartburn or change in bowel habits Skin: Denies abnormal skin rashes Lymphatics: Denies new lymphadenopathy or easy bruising Neurological:Denies numbness, tingling or new weaknesses Behavioral/Psych: patient has problems with memory Breast: palpable lump in the left breast All other systems were reviewed with the patient and are negative.  PHYSICAL EXAMINATION: ECOG PERFORMANCE STATUS: 1 - Symptomatic but completely ambulatory  Vitals:   01/29/16 1603  BP: 130/76  Pulse: 93  Resp: 17  Temp: 97.6 F (36.4 C)   Filed Weights   01/29/16 1603  Weight: 130 lb 12.8 oz (59.3 kg)    GENERAL:alert, no distress and comfortable SKIN: skin color, texture, turgor are normal, no rashes or significant lesions EYES: normal, conjunctiva are pink and non-injected, sclera clear OROPHARYNX:no exudate, no erythema and lips, buccal mucosa, and tongue normal  NECK: supple, thyroid normal size, non-tender, without nodularity LYMPH:  no palpable lymphadenopathy in the cervical, axillary or inguinal LUNGS: clear to auscultation and percussion with normal breathing effort HEART: regular rate & rhythm and no murmurs and no lower extremity edema ABDOMEN:abdomen soft, non-tender and normal bowel sounds Musculoskeletal:no cyanosis of digits and no clubbing  PSYCH: alert & oriented x 3 with fluent speech NEURO: no focal motor/sensory deficits BREAST Palpable lump in the medial aspect of the left breast at least 5 cm in size. No palpable axillary or supraclavicular lymphadenopathy (exam performed in the presence of a chaperone)    LABORATORY DATA:  I have reviewed the data as listed Lab Results  Component Value Date   WBC 5.6 09/27/2015   HGB 12.1 09/27/2015   HCT 36.8 09/27/2015   MCV 87.6 09/27/2015   PLT 259 09/27/2015   Lab Results  Component Value Date   NA 138 09/27/2015   K 3.6 09/27/2015   CL 104 09/27/2015   CO2 25 09/27/2015    RADIOGRAPHIC STUDIES: I have personally reviewed the radiological reports and agreed with the findings in the report.  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 After reviewing the pathology in detail, we proceeded to discuss the different treatment options between surgery, radiation, chemotherapy, antiestrogen therapies.  Recommendation: 1. Patient is in the process of obtaining clearance from pulmonary and cardiology. 2. So in the meantime I recommended neoadjuvant antiestrogen therapy with anastrozole 1 mg daily. 3. mastectomy +/- lymph node evaluation 4. Followed by adjuvant antiestrogen therapy with anastrozole versus tamoxifen depending on bone density test results.  Given her advanced age, I did not recommend systemic chemotherapy.  Anastrozole counseling: We discussed the risks and benefits of anti-estrogen therapy with aromatase inhibitors. These include but not limited to insomnia, hot flashes, mood changes, vaginal dryness, bone density loss, and weight gain. We strongly believe that the benefits far outweigh the risks. Patient understands these risks  and consented to starting treatment.  I will obtain the results of bone density done by her primary care physician. Return to clinic one week after surgery to discuss final surgical results.   All questions were answered. The patient knows to call the clinic with any problems, questions or concerns.    Rulon Eisenmenger, MD 01/29/16

## 2016-01-30 ENCOUNTER — Encounter: Payer: Self-pay | Admitting: Emergency Medicine

## 2016-01-30 ENCOUNTER — Ambulatory Visit (INDEPENDENT_AMBULATORY_CARE_PROVIDER_SITE_OTHER): Payer: Medicare Other | Admitting: Emergency Medicine

## 2016-01-30 DIAGNOSIS — J449 Chronic obstructive pulmonary disease, unspecified: Secondary | ICD-10-CM | POA: Diagnosis not present

## 2016-01-30 DIAGNOSIS — R05 Cough: Secondary | ICD-10-CM | POA: Diagnosis not present

## 2016-01-30 DIAGNOSIS — J441 Chronic obstructive pulmonary disease with (acute) exacerbation: Secondary | ICD-10-CM | POA: Insufficient documentation

## 2016-01-30 DIAGNOSIS — R059 Cough, unspecified: Secondary | ICD-10-CM

## 2016-01-30 MED ORDER — TIOTROPIUM BROMIDE MONOHYDRATE 2.5 MCG/ACT IN AERS: 2.0000 | INHALATION_SPRAY | Freq: Every day | RESPIRATORY_TRACT | 5 refills | Status: DC

## 2016-01-30 NOTE — Assessment & Plan Note (Signed)
Spirometry and curve of her flow volume loop are consistent with significant COPD. I believe that this likely explains her exertional dyspnea, especially since her hypertension and diastolic CHF well compensated. We will start her on bronchodilators empirically in preparation for her procedure. I discussed with her the increased risk associated with COPD and general anesthesia. She understands.   You pulmonary function testing is consistent with probable COPD. This will increase your risk for surgery or general anesthesia. It does not preclude a surgery if the benefits outweigh these risks. We will send a message to Dr. Rush Farmer to indicate your risk and that you are an acceptable candidate for your operation. Walking oximetry today on room air We will do a trial of Spiriva Respimat 2 puffs once a day. Please take this every day including up until and surrounding your planned operation.  Follow with Dr Lamonte Sakai In 4-6 weeks to assess whether you have benefited from the [facility]

## 2016-01-30 NOTE — Addendum Note (Signed)
Addended by: Cheree Ditto on: 01/30/2016 08:41 AM   Modules accepted: Orders

## 2016-01-30 NOTE — Patient Instructions (Signed)
You pulmonary function testing is consistent with probable COPD. This will increase your risk for surgery or general anesthesia. It does not preclude a surgery if the benefits outweigh these risks. We will send a message to Dr. Rush Farmer to indicate your risk and that you are an acceptable candidate for your operation. Walking oximetry today on room air We will do a trial of Spiriva Respimat 2 puffs once a day. Please take this every day including up until and surrounding your planned operation.  Follow with Dr Lamonte Sakai In 4-6 weeks to assess whether you have benefited from the [facility]

## 2016-01-30 NOTE — Assessment & Plan Note (Signed)
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 I will defer starting a PPI this time is a do not want to confuse its impact with that of the Spiriva. We will likely need to start her on a PPI in the future.

## 2016-01-30 NOTE — Progress Notes (Signed)
Subjective:    Patient ID: [name], female    DOB: 04/18/1941, 75 y.o.   MRN: [id number]  HPI Patient is a 75 year old former smoker (30 pack-yrs) with a history of left-sided breast cancer treated in 2000, now with newly diagnosed recurrent left sided breast cancer confirmed by biopsy 12/27/15. She also has a history of Hypertension and diastolic CHF. She was seen in consultation by Dr. Johnsie Cancel with cardiology on 01/15/16. An echocardiogram on 01/23/16 Was fairly well compensated with preserved left ventricular ejection fraction, some abnormal left ventricular relaxation, normal RV function and normal PA pressures.   She wears nocturnal oxygen, this was started by her family MD in [place] several years ago.  CTPA was performed 09/27/15 in the setting of substernal chest pain. This showed no evidence of pulmonary embolism, some mild right greater than left basilar scar without overt infiltrates and without any evidence for pneumonitis. Pulmonary function testing was performed on 01/17/16 and I have personally reviewed. This shows moderate to severe obstruction without a bronchodilator response, profound hyperinflation, And a decreased diffusion capacity consistent with COPD. She has been on BD's before in setting bronchitis.  She has some exertional SOB even with short distances. Able to do some housework. Able to grocery shop.   She has nocturnal cough and some GERD symptoms. Can wake her from sleep. No real cough during the day. She notes that she has had GERD before.    Review of Systems  Constitutional: Negative for fever and unexpected weight change.  HENT: Negative for congestion, dental problem, ear pain, nosebleeds, postnasal drip, rhinorrhea, sinus pressure, sneezing, sore throat and trouble swallowing.   Eyes: Negative for redness and itching.  Respiratory: Positive for shortness of breath. Negative for cough, chest tightness and wheezing.   Cardiovascular: Negative for palpitations and  leg swelling.  Gastrointestinal: Negative for nausea and vomiting.  Genitourinary: Negative for dysuria.  Musculoskeletal: Negative for joint swelling.  Skin: Negative for rash.  Neurological: Negative for headaches.  Hematological: Does not bruise/bleed easily.  Psychiatric/Behavioral: Negative for dysphoric mood. The patient is not nervous/anxious.    Past Medical History:  Diagnosis Date  . Arthritis   . Cancer [facility])    breast CA 2002  . CHF (congestive heart failure) (HCC)      Family History  Problem Relation Age of Onset  . Stroke Father   . Diabetes Mellitus I Son   . Leukemia Son      Social History   Social History  . Marital status: Widowed    Spouse name: N/A  . Number of children: N/A  . Years of education: N/A   Occupational History  . Not on file.   Social History Main Topics  . Smoking status: Former Research scientist (life sciences)  . Smokeless tobacco: Never Used  . Alcohol use No  . Drug use: No  . Sexual activity: Not on file   Other Topics Concern  . Not on file   Social History Narrative  . No narrative on file     No Known Allergies   Outpatient Medications Prior to Visit  Medication Sig Dispense Refill  . anastrozole (ARIMIDEX) 1 MG tablet Take 1 tablet (1 mg total) by mouth daily. 30 tablet 1  . Calcium Citrate-Vitamin D (CALCITRATE/VITAMIN D PO) Take 5 mLs by mouth daily.    . carvedilol (COREG) 6.25 MG tablet Take 1 tablet (6.25 mg total) by mouth 2 (two) times daily with a meal. 60 tablet 0  . lisinopril (PRINIVIL,ZESTRIL) 2.5  MG tablet Take 1 tablet (2.5 mg total) by mouth daily. 30 tablet 0  . Multiple Vitamin (MULTIVITAMIN WITH MINERALS) TABS tablet Take 2 tablets by mouth 3 (three) times daily.    . potassium chloride SA (K-DUR,KLOR-CON) 20 MEQ tablet Take 20 mEq by mouth daily.     . simvastatin (ZOCOR) 40 MG tablet Take 40 mg by mouth at bedtime.     Marland Kitchen venlafaxine XR (EFFEXOR-XR) 150 MG 24 hr capsule Take 150 mg by mouth daily with breakfast.      No facility-administered medications prior to visit.         Objective:   Physical Exam Vitals:   01/30/16 1419  BP: 114/64  Pulse: (!) 106  SpO2: 95%  Weight: 131 lb (59.4 kg)  Height: 5' (1.524 m)   Gen: Pleasant, well-nourished, in no distress,  normal affect  ENT: No lesions,  mouth clear,  oropharynx clear, no postnasal drip  Neck: No JVD, no TMG, no carotid bruits  Lungs: No use of accessory muscles, no dullness to percussion, clear without rales or rhonchi  Cardiovascular: RRR, heart sounds normal, no murmur or gallops, no peripheral edema  Musculoskeletal: No deformities, no cyanosis or clubbing  Neuro: alert, non focal  Skin: Warm, no lesions or rashes    09/27/15 --  COMPARISON:  None.  FINDINGS: No evidence of acute pulmonary thromboembolism.  No evidence of aortic aneurysm or dissection.  Three vessel coronary artery calcification. Mildly enlarged AP window lymph node on image 28 series 5 is stable dating back to 2015  No pneumothorax.  No pleural effusion.  Right upper lobe calcified granuloma on image 25 of series 8. Emphysema. Dependent atelectasis bilaterally.  Calcified granulomata are scattered throughout the spleen.  No acute vertebral compression deformity.  No rib fracture.  Review of the MIP images confirms the above findings.  IMPRESSION: No evidence of acute pulmonary thromboembolism.  Chronic changes.     Assessment & Plan:  COPD (chronic obstructive pulmonary disease) (HCC) Spirometry and curve of her flow volume loop are consistent with significant COPD. I believe that this likely explains her exertional dyspnea, especially since her hypertension and diastolic CHF well compensated. We will start her on bronchodilators empirically in preparation for her procedure. I discussed with her the increased risk associated with COPD and general anesthesia. She understands.   You pulmonary function testing is consistent with  probable COPD. This will increase your risk for surgery or general anesthesia. It does not preclude a surgery if the benefits outweigh these risks. We will send a message to Dr. Rush Farmer to indicate your risk and that you are an acceptable candidate for your operation. Walking oximetry today on room air We will do a trial of Spiriva Respimat 2 puffs once a day. Please take this every day including up until and surrounding your planned operation.  Follow with Dr Lamonte Sakai In 4-6 weeks to assess whether you have benefited from the Spiriva  Cough Based on her description I suspect that this is related to nocturnal GERD. I will defer starting a PPI this time is a do not want to confuse its impact with that of the Spiriva. We will likely need to start her on a PPI in the future.  Baltazar Apo, MD, PhD 01/30/2016, 3:05 PM [facility] [phone] or if no answer [phone]

## 2016-02-01 ENCOUNTER — Encounter: Payer: Self-pay | Admitting: *Deleted

## 2016-02-04 ENCOUNTER — Ambulatory Visit (INDEPENDENT_AMBULATORY_CARE_PROVIDER_SITE_OTHER): Payer: Medicare Other | Admitting: Diagnostic Neuroimaging

## 2016-02-04 ENCOUNTER — Encounter: Payer: Self-pay | Admitting: Diagnostic Neuroimaging

## 2016-02-04 VITALS — BP 105/71 | HR 91 | Ht 60.0 in | Wt 130.6 lb

## 2016-02-04 DIAGNOSIS — H9193 Unspecified hearing loss, bilateral: Secondary | ICD-10-CM | POA: Diagnosis not present

## 2016-02-04 DIAGNOSIS — R413 Other amnesia: Secondary | ICD-10-CM | POA: Diagnosis not present

## 2016-02-04 NOTE — Patient Instructions (Addendum)
Thank you for coming to see [place] at [facility]. I hope we have been able to provide you high quality care today.  You may receive a patient satisfaction survey over the next few weeks. We would appreciate your feedback and comments so that we may continue to improve ourselves and the health of our patients.  - consider memantine - consider MRI brain   ~~~~~~~~~~~~~~~~~~~~~~~~~~~~~~~~~~~~~~~~~~~~~~~~~~~~~~~~~~~~~~~~~  DR. PENUMALLI'S GUIDE TO HAPPY AND HEALTHY LIVING These are some of my general health and wellness recommendations. Some of them may apply to you better than others. Please use common sense as you try these suggestions and feel free to ask me any questions.   ACTIVITY/FITNESS Mental, social, emotional and physical stimulation are very important for brain and body health. Try learning a new activity (arts, music, language, sports, games).  Keep moving your body to the best of your abilities. You can do this at home, inside or outside, the park, community center, gym or anywhere you like. Consider a physical therapist or personal trainer to get started. Consider the app Sworkit. Fitness trackers such as smart-watches, smart-phones or Fitbits can help as well.   NUTRITION Eat more plants: colorful vegetables, nuts, seeds and berries.  Eat less sugar, salt, preservatives and processed foods.  Avoid toxins such as cigarettes and alcohol.  Drink water when you are thirsty. Warm water with a slice of lemon is an excellent morning drink to start the day.  Consider these websites for more information The Nutrition Source (https://www.henry-hernandez.biz/) Precision Nutrition (WindowBlog.ch)   RELAXATION Consider practicing mindfulness meditation or other relaxation techniques such as deep breathing, prayer, yoga, tai chi, massage. See website mindful.org or the apps Headspace or Calm to help get  started.   SLEEP Try to get at least 7-8+ hours sleep per day. Regular exercise and reduced caffeine will help you sleep better. Practice good sleep hygeine techniques. See website sleep.org for more information.   PLANNING Prepare estate planning, living will, healthcare POA documents. Sometimes this is best planned with the help of an attorney. Theconversationproject.org and agingwithdignity.org are excellent resources.
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
## 2016-02-18 ENCOUNTER — Encounter (HOSPITAL_COMMUNITY): Payer: Self-pay

## 2016-02-18 ENCOUNTER — Encounter (HOSPITAL_COMMUNITY)
Admission: RE | Admit: 2016-02-18 | Discharge: 2016-02-18 | Disposition: A | Discharge status: Home / Self Care | Payer: Medicare Other | Source: Ambulatory Visit | Attending: Surgery | Admitting: Surgery

## 2016-02-18 DIAGNOSIS — C50312 Malignant neoplasm of lower-inner quadrant of left female breast: Secondary | ICD-10-CM | POA: Insufficient documentation

## 2016-02-18 HISTORY — DX: Unspecified dementia, unspecified severity, without behavioral disturbance, psychotic disturbance, mood disturbance, and anxiety: F03.90

## 2016-02-18 HISTORY — DX: Anxiety disorder, unspecified: F41.9

## 2016-02-18 LAB — BASIC METABOLIC PANEL
ANION GAP: 10 (ref 5–15)
BUN: 8 mg/dL (ref 6–20)
CHLORIDE: 101 mmol/L (ref 101–111)
CO2: 29 mmol/L (ref 22–32)
CREATININE: 0.78 mg/dL (ref 0.44–1.00)
Calcium: 9.6 mg/dL (ref 8.9–10.3)
GFR calc non Af Amer: >60 mL/min (ref >60)
Glucose, Bld: 113 mg/dL — ABNORMAL HIGH (ref 65–99)
Potassium: 3.8 mmol/L (ref 3.5–5.1)
Sodium: 140 mmol/L (ref 135–145)

## 2016-02-18 LAB — CBC
HEMATOCRIT: 38.8 % (ref 36.0–46.0)
HEMOGLOBIN: 12.6 g/dL (ref 12.0–15.0)
MCH: 28.7 pg (ref 26.0–34.0)
MCHC: 32.5 g/dL (ref 30.0–36.0)
MCV: 88.4 fL (ref 78.0–100.0)
Platelets: 269 10*3/uL (ref 150–400)
RBC: 4.39 MIL/uL (ref 3.87–5.11)
RDW: 13.2 % (ref 11.5–15.5)
WBC: 9.3 10*3/uL (ref 4.0–10.5)

## 2016-02-18 MED ORDER — CHLORHEXIDINE GLUCONATE CLOTH 2 % EX PADS: 6.0000 | MEDICATED_PAD | Freq: Once | CUTANEOUS | Status: DC

## 2016-02-18 NOTE — Pre-Procedure Instructions (Signed)
    [name]  02/18/2016      [place], [place] [address] Phone: [phone] Fax: [phone]    Your procedure is scheduled on 02/26/16.  Report to [facility] Admitting at 530 A.M.  Call this number if you have problems the morning of surgery:  [phone]   Remember:  Do not eat food or drink liquids after midnight.  Take these medicines the morning of surgery with A SIP OF WATER --carvedilol,neurontin,synthroid,effexor   Do not wear jewelry, make-up or nail polish.  Do not wear lotions, powders, or perfumes, or deoderant.  Do not shave 48 hours prior to surgery.  Men may shave face and neck.  Do not bring valuables to the hospital.  [facility] is not responsible for any belongings or valuables.  Contacts, dentures or bridgework may not be worn into surgery.  Leave your suitcase in the car.  After surgery it may be brought to your room.  For patients admitted to the hospital, discharge time will be determined by your treatment team.  Patients discharged the day of surgery will not be allowed to drive home.   Name and phone number of your driver:   Special instructions:  Do not take any aspirin,anti-inflammatories,vitamins,or herbal supplements 5-7 days prior to surgery.  Please read over the following fact sheets that you were given.

## 2016-02-25 NOTE — H&P (Signed)
[name]  Location: [facility] Patient #: [id number] DOB: 24-May-1941 Single / Language: Cleophus Molt / Race: White Female   History of Present Illness Patient words: New-Breast Ca.  The patient is a 75 year old female who presents with breast cancer. This is a pleasant patient referred by Dr. Elige Radon after the recent diagnosis of a left breast cancer. The patient only recently palpated a mass in her left breast. She has since had a mammogram and ultrasound as well as a stereotactic biopsy of the mass. This showed invasive ductal carcinoma. I do not have the receptor status yet. She has moved here from [facility]. She had a previous left breast cancer treated in 2002 with what sounds like a left breast lumpectomy with sentinel node biopsy followed by and hormonal therapy and radiation. She denies nipple discharge. Today, she is accompanied by her family. She has issues with memory loss currently. She does have a history of CHF does not have a cardiologist here in town. She has a previous history of a pulmonary embolism but is now off anticoagulation after her recent diverticular bleed.   Other Problems Back Pain Breast Cancer Congestive Heart Failure Gastroesophageal Reflux Disease Lump In Breast Thyroid Disease  Past Surgical History   Appendectomy Breast Mass; Local Excision Left. Breast Reconstruction Left. Colon Polyp Removal - Open Foot Surgery Left. Hysterectomy (not due to cancer) - Complete Spinal Surgery - Lower Back Tonsillectomy  Diagnostic Studies History  Colonoscopy 1-5 years ago Mammogram within last year Pap Smear >5 years ago  Allergies  No Known Drug Allergies  Medication History Carvedilol (6.25MG  Tablet, Oral) Active. Furosemide (20MG  Tablet, Oral) Active. Gabapentin (400MG  Capsule, Oral) Active. Potassium Chloride Crys ER (20MEQ Tablet ER, Oral) Active. Venlafaxine HCl ER (150MG  Capsule  ER 24HR, Oral) Active. Aspirin (81MG  Tablet, Oral) Active. Lisinopril (2.5MG  Tablet, Oral) Active. Calcitrate/Vitamin D (315-200MG -UNIT Tablet, Oral) Active. Multivitamin Adult (Oral) Active. Medications Reconciled  Family History  Cancer Son. Cerebrovascular Accident Father. Diabetes Mellitus Son. Kidney Disease Son.  Pregnancy / Birth History  Age at menarche 81 years. Age of menopause 47-50 Gravida 2 Length (months) of breastfeeding 3-6 Maternal age 4-25 Para 2    Review of Systems  General Not Present- Appetite Loss, Chills, Fatigue, Fever, Night Sweats, Weight Gain and Weight Loss. Skin Not Present- Change in Wart/Mole, Dryness, Hives, Jaundice, New Lesions, Non-Healing Wounds, Rash and Ulcer. HEENT Not Present- Earache, Hearing Loss, Hoarseness, Nose Bleed, Oral Ulcers, Ringing in the Ears, Seasonal Allergies, Sinus Pain, Sore Throat, Visual Disturbances, Wears glasses/contact lenses and Yellow Eyes. Cardiovascular Present- Shortness of Breath. Not Present- Chest Pain, Difficulty Breathing Lying Down, Leg Cramps, Palpitations, Rapid Heart Rate and Swelling of Extremities. Gastrointestinal Not Present- Abdominal Pain, Bloating, Bloody Stool, Change in Bowel Habits, Chronic diarrhea, Constipation, Difficulty Swallowing, Excessive gas, Gets full quickly at meals, Hemorrhoids, Indigestion, Nausea, Rectal Pain and Vomiting. Female Genitourinary Not Present- Frequency, Nocturia, Painful Urination, Pelvic Pain and Urgency. Musculoskeletal Present- Back Pain. Not Present- Joint Pain, Joint Stiffness, Muscle Pain, Muscle Weakness and Swelling of Extremities. Neurological Present- Trouble walking. Not Present- Decreased Memory, Fainting, Headaches, Numbness, Seizures, Tingling, Tremor and Weakness. Psychiatric Not Present- Anxiety, Bipolar, Change in Sleep Pattern, Depression, Fearful and Frequent crying. Endocrine Not Present- Cold Intolerance, Excessive Hunger, Hair  Changes, Heat Intolerance, Hot flashes and New Diabetes. Hematology Not Present- Blood Thinners, Easy Bruising, Excessive bleeding, Gland problems, HIV and Persistent Infections.  Vitals  Weight: 130.2 lb Height: 61in Body Surface Area: 1.57  m Body Mass Index: 24.6 kg/m  Temp.: 98.51F(Oral)  Pulse: 100 (Regular)  BP: 118/82 (Sitting, Left Arm, Standard)     Physical Exam  General Mental Status-Alert. General Appearance-Consistent with stated age. Hydration-Well hydrated. Voice-Normal.  Head and Neck Head-normocephalic, atraumatic with no lesions or palpable masses. Trachea-midline. Thyroid Gland Characteristics - normal size and consistency.  Eye Eyeball - Bilateral-Extraocular movements intact. Sclera/Conjunctiva - Bilateral-No scleral icterus.  Chest and Lung Exam Chest and lung exam reveals -quiet, even and easy respiratory effort with no use of accessory muscles and on auscultation, normal breath sounds, no adventitious sounds and normal vocal resonance. Inspection Chest Wall - Normal. Back - normal.  Breast Breast - Left-Symmetric, Biopsy scar and Lumpectomy scar, Non Tender, No Dimpling, No Inflammation, No Mastectomy scars, No Peau d' Orange. Note: There is a large almost 2 cm mass at the lower inner quadrant of the left breast. It is mobile. She has a small incision around her areola and in the axilla. There is no left axillary adenopathy Breast - Right-Symmetric and Biopsy scar, Non Tender, No Dimpling, No Inflammation, No Lumpectomy scars, No Mastectomy scars, No Peau d' Orange. Note: There are scars on the right breast from a previous breast lift. There are no palpable masses Breast Lump-No Palpable Breast Mass.  Cardiovascular Cardiovascular examination reveals -normal heart sounds, regular rate and rhythm with no murmurs and normal pedal pulses bilaterally.  Abdomen Inspection Inspection of the abdomen reveals - No  Hernias. Skin - Scar - no surgical scars. Palpation/Percussion Palpation and Percussion of the abdomen reveal - Soft, Non Tender, No Rebound tenderness, No Rigidity (guarding) and No hepatosplenomegaly. Auscultation Auscultation of the abdomen reveals - Bowel sounds normal.  Neurologic Neurologic evaluation reveals -alert and oriented x 3 with no impairment of recent or remote memory. Mental Status-Normal.  Musculoskeletal Normal Exam - Left-Upper Extremity Strength Normal and Lower Extremity Strength Normal. Normal Exam - Right-Upper Extremity Strength Normal and Lower Extremity Strength Normal.  Lymphatic Head & Neck  General Head & Neck Lymphatics: Bilateral - Description - Normal. Axillary  General Axillary Region: Bilateral - Description - Normal. Tenderness - Non Tender. Femoral & Inguinal - Did not examine.    Assessment & Plan   BREAST CANCER, LEFT (C50.912)  Impression: I had a long discussion with the patient and her family. Because of her previous radiation to the left breast and the size of this mass, a left mastectomy with sentinel lymph node biopsy is recommended. I discussed referral to a plastic surgeon but she declined. We will refer her to make oncology as well as get a preoperative cardiology evaluation for clearance. I discussed the surgical procedure with the family in detail. I discussed the risks which include but are not limited to bleeding, infection, need for further surgery margins or nodes are positive, need for postoperative drains, skin breakdown, injury to other structures, postoperative recovery, DVT, etc. They understand and wish to proceed with the above-mentioned plans

## 2016-02-26 ENCOUNTER — Observation Stay (HOSPITAL_COMMUNITY)
Admission: RE | Admit: 2016-02-26 | Discharge: 2016-02-27 | Disposition: A | Discharge status: Home / Self Care | Payer: Medicare Other | Source: Ambulatory Visit | Attending: Surgery | Admitting: Surgery

## 2016-02-26 ENCOUNTER — Ambulatory Visit (HOSPITAL_COMMUNITY): Payer: Medicare Other | Admitting: Certified Registered"

## 2016-02-26 ENCOUNTER — Ambulatory Visit (HOSPITAL_COMMUNITY)
Admission: RE | Admit: 2016-02-26 | Discharge: 2016-02-26 | Disposition: A | Discharge status: Home / Self Care | Payer: Medicare Other | Source: Ambulatory Visit | Attending: Surgery | Admitting: Surgery

## 2016-02-26 ENCOUNTER — Encounter (HOSPITAL_COMMUNITY)
Admission: RE | Disposition: A | Discharge status: Home / Self Care | Payer: Self-pay | Source: Ambulatory Visit | Attending: Surgery

## 2016-02-26 ENCOUNTER — Encounter (HOSPITAL_COMMUNITY): Payer: Self-pay | Admitting: Certified Registered"

## 2016-02-26 DIAGNOSIS — Z86711 Personal history of pulmonary embolism: Secondary | ICD-10-CM | POA: Insufficient documentation

## 2016-02-26 DIAGNOSIS — Z853 Personal history of malignant neoplasm of breast: Secondary | ICD-10-CM | POA: Insufficient documentation

## 2016-02-26 DIAGNOSIS — Z87891 Personal history of nicotine dependence: Secondary | ICD-10-CM | POA: Insufficient documentation

## 2016-02-26 DIAGNOSIS — Z90710 Acquired absence of both cervix and uterus: Secondary | ICD-10-CM | POA: Insufficient documentation

## 2016-02-26 DIAGNOSIS — Z7982 Long term (current) use of aspirin: Secondary | ICD-10-CM | POA: Insufficient documentation

## 2016-02-26 DIAGNOSIS — Z170 Estrogen receptor positive status [ER+]: Secondary | ICD-10-CM | POA: Diagnosis not present

## 2016-02-26 DIAGNOSIS — I509 Heart failure, unspecified: Secondary | ICD-10-CM | POA: Diagnosis not present

## 2016-02-26 DIAGNOSIS — Z9012 Acquired absence of left breast and nipple: Secondary | ICD-10-CM | POA: Diagnosis not present

## 2016-02-26 DIAGNOSIS — Z809 Family history of malignant neoplasm, unspecified: Secondary | ICD-10-CM | POA: Insufficient documentation

## 2016-02-26 DIAGNOSIS — Z79899 Other long term (current) drug therapy: Secondary | ICD-10-CM | POA: Diagnosis not present

## 2016-02-26 DIAGNOSIS — C50312 Malignant neoplasm of lower-inner quadrant of left female breast: Secondary | ICD-10-CM

## 2016-02-26 DIAGNOSIS — C50912 Malignant neoplasm of unspecified site of left female breast: Principal | ICD-10-CM | POA: Insufficient documentation

## 2016-02-26 DIAGNOSIS — Z86010 Personal history of colonic polyps: Secondary | ICD-10-CM | POA: Insufficient documentation

## 2016-02-26 DIAGNOSIS — Z8349 Family history of other endocrine, nutritional and metabolic diseases: Secondary | ICD-10-CM | POA: Insufficient documentation

## 2016-02-26 DIAGNOSIS — J449 Chronic obstructive pulmonary disease, unspecified: Secondary | ICD-10-CM | POA: Insufficient documentation

## 2016-02-26 DIAGNOSIS — Z823 Family history of stroke: Secondary | ICD-10-CM | POA: Insufficient documentation

## 2016-02-26 DIAGNOSIS — E079 Disorder of thyroid, unspecified: Secondary | ICD-10-CM | POA: Diagnosis not present

## 2016-02-26 HISTORY — PX: MASTECTOMY W/ SENTINEL NODE BIOPSY: SHX2001

## 2016-02-26 SURGERY — MASTECTOMY WITH SENTINEL LYMPH NODE BIOPSY
Anesthesia: General | Site: Breast | Laterality: Left

## 2016-02-26 MED ORDER — VENLAFAXINE HCL ER 75 MG PO CP24
150.0000 mg | ORAL_CAPSULE | Freq: Every day | ORAL | Status: DC
  Administered 2016-02-27: 150 mg via ORAL
  Filled 2016-02-26 (×2): qty 2

## 2016-02-26 MED ORDER — FUROSEMIDE 20 MG PO TABS
20.0000 mg | ORAL_TABLET | Freq: Every day | ORAL | Status: DC
  Administered 2016-02-26 – 2016-02-27 (×2): 20 mg via ORAL
  Filled 2016-02-26 (×2): qty 1

## 2016-02-26 MED ORDER — 0.9 % SODIUM CHLORIDE (POUR BTL) OPTIME
TOPICAL | Status: DC | PRN
  Administered 2016-02-26 (×2): 1000 mL

## 2016-02-26 MED ORDER — FENTANYL CITRATE (PF) 100 MCG/2ML IJ SOLN
INTRAMUSCULAR | Status: DC | PRN
Start: 2016-02-26 — End: 2016-02-26
  Administered 2016-02-26 (×2): 50 ug via INTRAVENOUS

## 2016-02-26 MED ORDER — FENTANYL CITRATE (PF) 100 MCG/2ML IJ SOLN
INTRAMUSCULAR | Status: AC
  Filled 2016-02-26: qty 4

## 2016-02-26 MED ORDER — GABAPENTIN 400 MG PO CAPS
400.0000 mg | ORAL_CAPSULE | Freq: Three times a day (TID) | ORAL | Status: DC
Start: 2016-02-26 — End: 2016-02-27
  Administered 2016-02-26 – 2016-02-27 (×3): 400 mg via ORAL
  Filled 2016-02-26 (×3): qty 1

## 2016-02-26 MED ORDER — BUPIVACAINE HCL (PF) 0.5 % IJ SOLN
INTRAMUSCULAR | Status: DC | PRN
  Administered 2016-02-26: 25 mL

## 2016-02-26 MED ORDER — MORPHINE SULFATE (PF) 2 MG/ML IV SOLN: 1.0000 mg | INTRAVENOUS | Status: DC | PRN

## 2016-02-26 MED ORDER — ONDANSETRON HCL 4 MG/2ML IJ SOLN
INTRAMUSCULAR | Status: DC | PRN
  Administered 2016-02-26: 4 mg via INTRAVENOUS

## 2016-02-26 MED ORDER — CARVEDILOL 3.125 MG PO TABS
ORAL_TABLET | ORAL | Status: AC
Start: 2016-02-26 — End: 2016-02-26
  Filled 2016-02-26: qty 2

## 2016-02-26 MED ORDER — ENOXAPARIN SODIUM 40 MG/0.4ML ~~LOC~~ SOLN
40.0000 mg | SUBCUTANEOUS | Status: DC
  Administered 2016-02-27: 40 mg via SUBCUTANEOUS
  Filled 2016-02-26: qty 0.4

## 2016-02-26 MED ORDER — MIRABEGRON ER 25 MG PO TB24
25.0000 mg | ORAL_TABLET | Freq: Every day | ORAL | Status: DC
  Administered 2016-02-26 – 2016-02-27 (×2): 25 mg via ORAL
  Filled 2016-02-26 (×2): qty 1

## 2016-02-26 MED ORDER — DIPHENHYDRAMINE HCL 50 MG/ML IJ SOLN: 12.5000 mg | Freq: Four times a day (QID) | INTRAMUSCULAR | Status: DC | PRN

## 2016-02-26 MED ORDER — CEFAZOLIN SODIUM-DEXTROSE 2-4 GM/100ML-% IV SOLN
2.0000 g | INTRAVENOUS | Status: AC
  Administered 2016-02-26: 2 g via INTRAVENOUS
  Filled 2016-02-26: qty 100

## 2016-02-26 MED ORDER — ACETAMINOPHEN 325 MG PO TABS: 650.0000 mg | ORAL_TABLET | Freq: Four times a day (QID) | ORAL | Status: DC | PRN

## 2016-02-26 MED ORDER — PROPOFOL 10 MG/ML IV BOLUS
INTRAVENOUS | Status: DC | PRN
  Administered 2016-02-26: 110 mg via INTRAVENOUS

## 2016-02-26 MED ORDER — EPHEDRINE 5 MG/ML INJ
INTRAVENOUS | Status: AC
  Filled 2016-02-26: qty 10

## 2016-02-26 MED ORDER — EPHEDRINE SULFATE 50 MG/ML IJ SOLN
INTRAMUSCULAR | Status: DC | PRN
  Administered 2016-02-26 (×2): 10 mg via INTRAVENOUS

## 2016-02-26 MED ORDER — TIOTROPIUM BROMIDE MONOHYDRATE 2.5 MCG/ACT IN AERS: 2.0000 | INHALATION_SPRAY | Freq: Every day | RESPIRATORY_TRACT | Status: DC

## 2016-02-26 MED ORDER — DEXAMETHASONE SODIUM PHOSPHATE 10 MG/ML IJ SOLN
INTRAMUSCULAR | Status: DC | PRN
  Administered 2016-02-26: 4 mg via INTRAVENOUS

## 2016-02-26 MED ORDER — TIOTROPIUM BROMIDE MONOHYDRATE 18 MCG IN CAPS
18.0000 ug | ORAL_CAPSULE | Freq: Every day | RESPIRATORY_TRACT | Status: DC
  Filled 2016-02-26: qty 5

## 2016-02-26 MED ORDER — SODIUM CHLORIDE 0.9 % IJ SOLN
INTRAMUSCULAR | Status: DC | PRN
  Administered 2016-02-26: 5 mL via INTRAMUSCULAR

## 2016-02-26 MED ORDER — LIDOCAINE 2% (20 MG/ML) 5 ML SYRINGE
INTRAMUSCULAR | Status: DC | PRN
  Administered 2016-02-26: 60 mg via INTRAVENOUS

## 2016-02-26 MED ORDER — CARVEDILOL 6.25 MG PO TABS
6.2500 mg | ORAL_TABLET | ORAL | Status: AC
  Administered 2016-02-26: 6.25 mg via ORAL
  Filled 2016-02-26: qty 1

## 2016-02-26 MED ORDER — ANASTROZOLE 1 MG PO TABS
1.0000 mg | ORAL_TABLET | Freq: Every day | ORAL | Status: DC
  Administered 2016-02-26 – 2016-02-27 (×2): 1 mg via ORAL
  Filled 2016-02-26 (×2): qty 1

## 2016-02-26 MED ORDER — CARVEDILOL 6.25 MG PO TABS
6.2500 mg | ORAL_TABLET | Freq: Two times a day (BID) | ORAL | Status: DC
  Administered 2016-02-26 – 2016-02-27 (×2): 6.25 mg via ORAL
  Filled 2016-02-26 (×2): qty 1

## 2016-02-26 MED ORDER — ONDANSETRON 4 MG PO TBDP: 4.0000 mg | ORAL_TABLET | Freq: Four times a day (QID) | ORAL | Status: DC | PRN

## 2016-02-26 MED ORDER — DEXAMETHASONE SODIUM PHOSPHATE 10 MG/ML IJ SOLN
INTRAMUSCULAR | Status: AC
Start: 2016-02-26 — End: 2016-02-26
  Filled 2016-02-26: qty 1

## 2016-02-26 MED ORDER — PHENYLEPHRINE 40 MCG/ML (10ML) SYRINGE FOR IV PUSH (FOR BLOOD PRESSURE SUPPORT)
PREFILLED_SYRINGE | INTRAVENOUS | Status: DC | PRN
  Administered 2016-02-26: 80 ug via INTRAVENOUS

## 2016-02-26 MED ORDER — MIDAZOLAM HCL 5 MG/5ML IJ SOLN
INTRAMUSCULAR | Status: DC | PRN
  Administered 2016-02-26: 1 mg via INTRAVENOUS

## 2016-02-26 MED ORDER — ONDANSETRON HCL 4 MG/2ML IJ SOLN: 4.0000 mg | Freq: Four times a day (QID) | INTRAMUSCULAR | Status: DC | PRN

## 2016-02-26 MED ORDER — SODIUM CHLORIDE 0.9 % IJ SOLN
INTRAMUSCULAR | Status: AC
  Filled 2016-02-26: qty 10

## 2016-02-26 MED ORDER — PHENYLEPHRINE 40 MCG/ML (10ML) SYRINGE FOR IV PUSH (FOR BLOOD PRESSURE SUPPORT)
PREFILLED_SYRINGE | INTRAVENOUS | Status: AC
  Filled 2016-02-26: qty 10

## 2016-02-26 MED ORDER — TECHNETIUM TC 99M SULFUR COLLOID FILTERED
1.0000 | Freq: Once | INTRAVENOUS | Status: AC | PRN
  Administered 2016-02-26: 1 via INTRADERMAL

## 2016-02-26 MED ORDER — LACTATED RINGERS IV SOLN
INTRAVENOUS | Status: DC | PRN
  Administered 2016-02-26: 07:00:00 via INTRAVENOUS

## 2016-02-26 MED ORDER — PROPOFOL 10 MG/ML IV BOLUS
INTRAVENOUS | Status: AC
  Filled 2016-02-26: qty 20

## 2016-02-26 MED ORDER — DIPHENHYDRAMINE HCL 12.5 MG/5ML PO ELIX: 12.5000 mg | ORAL_SOLUTION | Freq: Four times a day (QID) | ORAL | Status: DC | PRN

## 2016-02-26 MED ORDER — OXYCODONE HCL 5 MG PO TABS
5.0000 mg | ORAL_TABLET | ORAL | Status: DC | PRN
  Administered 2016-02-26: 10 mg via ORAL
  Filled 2016-02-26: qty 2

## 2016-02-26 MED ORDER — LIDOCAINE 2% (20 MG/ML) 5 ML SYRINGE
INTRAMUSCULAR | Status: AC
  Filled 2016-02-26: qty 5

## 2016-02-26 MED ORDER — MIDAZOLAM HCL 2 MG/2ML IJ SOLN
INTRAMUSCULAR | Status: AC
  Filled 2016-02-26: qty 2

## 2016-02-26 MED ORDER — LEVOTHYROXINE SODIUM 25 MCG PO TABS
25.0000 ug | ORAL_TABLET | Freq: Every day | ORAL | Status: DC
  Administered 2016-02-27: 25 ug via ORAL
  Filled 2016-02-26 (×2): qty 1

## 2016-02-26 MED ORDER — ACETAMINOPHEN 650 MG RE SUPP: 650.0000 mg | Freq: Four times a day (QID) | RECTAL | Status: DC | PRN

## 2016-02-26 MED ORDER — METHYLENE BLUE 0.5 % INJ SOLN
INTRAVENOUS | Status: AC
  Filled 2016-02-26: qty 10

## 2016-02-26 MED ORDER — ONDANSETRON HCL 4 MG/2ML IJ SOLN
INTRAMUSCULAR | Status: AC
  Filled 2016-02-26: qty 2

## 2016-02-26 MED ORDER — POTASSIUM CHLORIDE IN NACL 20-0.9 MEQ/L-% IV SOLN
INTRAVENOUS | Status: DC
  Administered 2016-02-26: 50 mL/h via INTRAVENOUS
  Filled 2016-02-26: qty 1000

## 2016-02-26 SURGICAL SUPPLY — 48 items
APPLIER CLIP 9.375 MED OPEN (MISCELLANEOUS) ×3
BINDER BREAST LRG (GAUZE/BANDAGES/DRESSINGS) ×3 IMPLANT
BINDER BREAST XLRG (GAUZE/BANDAGES/DRESSINGS) IMPLANT
CANISTER SUCTION 2500CC (MISCELLANEOUS) ×3 IMPLANT
CHLORAPREP W/TINT 26ML (MISCELLANEOUS) ×3 IMPLANT
CLIP APPLIE 9.375 MED OPEN (MISCELLANEOUS) ×1 IMPLANT
CONT SPEC 4OZ CLIKSEAL STRL BL (MISCELLANEOUS) ×3 IMPLANT
COVER PROBE W GEL 5X96 (DRAPES) ×3 IMPLANT
COVER SURGICAL LIGHT HANDLE (MISCELLANEOUS) ×3 IMPLANT
DERMABOND ADVANCED (GAUZE/BANDAGES/DRESSINGS) ×2
DERMABOND ADVANCED .7 DNX12 (GAUZE/BANDAGES/DRESSINGS) ×1 IMPLANT
DRAIN CHANNEL 19F RND (DRAIN) ×3 IMPLANT
DRAPE LAPAROSCOPIC ABDOMINAL (DRAPES) ×3 IMPLANT
DRAPE UTILITY XL STRL (DRAPES) IMPLANT
DRSG PAD ABDOMINAL 8X10 ST (GAUZE/BANDAGES/DRESSINGS) ×3 IMPLANT
ELECT REM PT RETURN 9FT ADLT (ELECTROSURGICAL) ×3
ELECTRODE REM PT RTRN 9FT ADLT (ELECTROSURGICAL) ×1 IMPLANT
EVACUATOR SILICONE 100CC (DRAIN) ×3 IMPLANT
GAUZE SPONGE 4X4 12PLY STRL (GAUZE/BANDAGES/DRESSINGS) ×3 IMPLANT
GLOVE BIOGEL PI IND STRL 6.5 (GLOVE) ×1 IMPLANT
GLOVE BIOGEL PI INDICATOR 6.5 (GLOVE) ×2
GLOVE SURG SIGNA 7.5 PF LTX (GLOVE) ×3 IMPLANT
GLOVE SURG SS PI 6.0 STRL IVOR (GLOVE) ×3 IMPLANT
GLOVE SURG SS PI 6.5 STRL IVOR (GLOVE) ×3 IMPLANT
GOWN STRL REUS W/ TWL LRG LVL3 (GOWN DISPOSABLE) ×1 IMPLANT
GOWN STRL REUS W/ TWL XL LVL3 (GOWN DISPOSABLE) ×1 IMPLANT
GOWN STRL REUS W/TWL LRG LVL3 (GOWN DISPOSABLE) ×2
GOWN STRL REUS W/TWL XL LVL3 (GOWN DISPOSABLE) ×2
KIT BASIN OR (CUSTOM PROCEDURE TRAY) ×3 IMPLANT
KIT ROOM TURNOVER OR (KITS) ×3 IMPLANT
NEEDLE 18GX1X1/2 (RX/OR ONLY) (NEEDLE) ×3 IMPLANT
NEEDLE FILTER BLUNT 18X 1/2SAF (NEEDLE) ×2
NEEDLE FILTER BLUNT 18X1 1/2 (NEEDLE) ×1 IMPLANT
NEEDLE HYPO 25GX1X1/2 BEV (NEEDLE) ×3 IMPLANT
NS IRRIG 1000ML POUR BTL (IV SOLUTION) ×6 IMPLANT
PACK GENERAL/GYN (CUSTOM PROCEDURE TRAY) ×3 IMPLANT
PAD ARMBOARD 7.5X6 YLW CONV (MISCELLANEOUS) ×6 IMPLANT
SPECIMEN JAR X LARGE (MISCELLANEOUS) ×3 IMPLANT
STAPLER VISISTAT 35W (STAPLE) IMPLANT
SUT ETHILON 3 0 FSL (SUTURE) ×3 IMPLANT
SUT MON AB 4-0 PC3 18 (SUTURE) ×3 IMPLANT
SUT SILK 2 0 SH (SUTURE) ×3 IMPLANT
SUT VIC AB 3-0 SH 27 (SUTURE) ×4
SUT VIC AB 3-0 SH 27XBRD (SUTURE) ×2 IMPLANT
SYR CONTROL 10ML LL (SYRINGE) ×3 IMPLANT
TOWEL OR 17X24 6PK STRL BLUE (TOWEL DISPOSABLE) ×3 IMPLANT
TOWEL OR 17X26 10 PK STRL BLUE (TOWEL DISPOSABLE) ×3 IMPLANT
WATER STERILE IRR 1000ML POUR (IV SOLUTION) IMPLANT

## 2016-02-26 NOTE — Anesthesia Procedure Notes (Signed)
Procedure Name: LMA Insertion Date/Time: 02/26/2016 7:35 AM Performed by: Melina Copa, Darian Cansler R Pre-anesthesia Checklist: Patient identified, Emergency Drugs available, Suction available and Patient being monitored Patient Re-evaluated:Patient Re-evaluated prior to inductionOxygen Delivery Method: Circle System Utilized Preoxygenation: Pre-oxygenation with 100% oxygen Intubation Type: IV induction Ventilation: Mask ventilation without difficulty LMA: LMA inserted LMA Size: 4.0 Number of attempts: 1 Placement Confirmation: positive ETCO2 Tube secured with: Tape Dental Injury: Teeth and Oropharynx as per pre-operative assessment

## 2016-02-26 NOTE — Op Note (Signed)
LEFT MASTECTOMY WITH SENTINEL LYMPH NODE BIOPSY  Procedure Note  [name] 02/26/2016   Pre-op Diagnosis: LEFT BREAST CANCER     Post-op Diagnosis: same  Procedure(s): LEFT MASTECTOMY WITH SENTINEL LYMPH NODE BIOPSY INJECTION OF BLUE DYE  Surgeon(s): Coralie Keens, MD  Anesthesia: General  Staff:  Circulator: Christen Bame, RN Scrub Person: Georgeanna Harrison  Estimated Blood Loss: Minimal               Specimens: sent to path          [facility]   Date: 02/26/2016  Time: 8:35 AM

## 2016-02-26 NOTE — Transfer of Care (Signed)
Immediate Anesthesia Transfer of Care Note  Patient: [name]  Procedure(s) Performed: Procedure(s): LEFT MASTECTOMY WITH SENTINEL LYMPH NODE BIOPSY (Left)  Patient Location: PACU  Anesthesia Type:GA combined with regional for post-op pain  Level of Consciousness: awake, patient cooperative and confused  Airway & Oxygen Therapy: Patient Spontanous Breathing and Patient connected to nasal cannula oxygen  Post-op Assessment: Report given to RN, Post -op Vital signs reviewed and stable and Patient moving all extremities  Post vital signs: Reviewed and stable  Last Vitals:  Vitals:   02/26/16 0601 02/26/16 0841  BP: 127/74   Pulse: 90   Resp: 20   Temp: 36.6 C 36.4 C    Last Pain: There were no vitals filed for this visit.    Patients Stated Pain Goal: 3 ([id number])  Complications: No apparent anesthesia complications

## 2016-02-26 NOTE — Anesthesia Postprocedure Evaluation (Signed)
Anesthesia Post Note  Patient: [name]  Procedure(s) Performed: Procedure(s) (LRB): LEFT MASTECTOMY WITH SENTINEL LYMPH NODE BIOPSY (Left)  Patient location during evaluation: PACU Anesthesia Type: General and Regional Level of consciousness: awake and alert Pain management: pain level controlled Vital Signs Assessment: post-procedure vital signs reviewed and stable Respiratory status: spontaneous breathing, nonlabored ventilation, respiratory function stable and patient connected to nasal cannula oxygen Cardiovascular status: blood pressure returned to baseline and stable Postop Assessment: no signs of nausea or vomiting Anesthetic complications: no    Last Vitals:  Vitals:   02/26/16 0841 02/26/16 0856  BP: (!) 123/106 125/74  Pulse: 89 86  Resp: (!) 23 (!) 21  Temp: 36.4 C     Last Pain: There were no vitals filed for this visit.               Roben Tatsch S

## 2016-02-26 NOTE — Anesthesia Procedure Notes (Signed)
Anesthesia Regional Block:  Pectoralis block  Pre-Anesthetic Checklist: ,, timeout performed, Correct Patient, Correct Site, Correct Laterality, Correct Procedure, Correct Position, site marked, Risks and benefits discussed,  Surgical consent,  Pre-op evaluation,  At surgeon's request and post-op pain management  Laterality: Left  Prep: chloraprep       Needles:  Injection technique: Single-shot  Needle Type: Stimiplex      Needle Gauge: 21 G    Additional Needles:  Procedures: ultrasound guided (picture in chart) Pectoralis block Narrative:  Injection made incrementally with aspirations every 5 mL.  Performed by: Personally  Anesthesiologist: Sanay Belmar  Additional Notes: Patient tolerated the procedure well without complications

## 2016-02-26 NOTE — Anesthesia Preprocedure Evaluation (Signed)
Anesthesia Evaluation  Patient identified by MRN, date of birth, ID band Patient awake    Reviewed: Allergy & Precautions, NPO status , Patient's Chart, lab work & pertinent test results  Airway Mallampati: II  TM Distance: >3 FB Neck ROM: Full    Dental no notable dental hx.    Pulmonary COPD, former smoker,    Pulmonary exam normal breath sounds clear to auscultation       Cardiovascular Normal cardiovascular exam Rhythm:Regular Rate:Normal     Neuro/Psych negative neurological ROS  negative psych ROS   GI/Hepatic negative GI ROS, Neg liver ROS,   Endo/Other  negative endocrine ROS  Renal/GU negative Renal ROS  negative genitourinary   Musculoskeletal negative musculoskeletal ROS (+)   Abdominal   Peds negative pediatric ROS (+)  Hematology negative hematology ROS (+)   Anesthesia Other Findings   Reproductive/Obstetrics negative OB ROS                             Anesthesia Physical Anesthesia Plan  ASA: III  Anesthesia Plan: General   Post-op Pain Management: GA combined w/ Regional for post-op pain   Induction: Intravenous  Airway Management Planned: LMA  Additional Equipment:   Intra-op Plan:   Post-operative Plan: Extubation in OR  Informed Consent: I have reviewed the patients History and Physical, chart, labs and discussed the procedure including the risks, benefits and alternatives for the proposed anesthesia with the patient or authorized representative who has indicated his/her understanding and acceptance.   Dental advisory given  Plan Discussed with: CRNA and Surgeon  Anesthesia Plan Comments:         Anesthesia Quick Evaluation

## 2016-02-26 NOTE — Interval H&P Note (Signed)
History and Physical Interval Note:no change in H and P  02/26/2016 6:47 AM  [name]  has presented today for surgery, with the diagnosis of LEFT BREAST CANCER  The various methods of treatment have been discussed with the patient and family. After consideration of risks, benefits and other options for treatment, the patient has consented to  Procedure(s): LEFT MASTECTOMY WITH SENTINEL LYMPH NODE BIOPSY (Left) as a surgical intervention .  The patient's history has been reviewed, patient examined, no change in status, stable for surgery.  I have reviewed the patient's chart and labs.  Questions were answered to the patient's satisfaction.     Rodell Marrs A

## 2016-02-27 ENCOUNTER — Encounter (HOSPITAL_COMMUNITY): Payer: Self-pay | Admitting: Surgery

## 2016-02-27 DIAGNOSIS — C50912 Malignant neoplasm of unspecified site of left female breast: Secondary | ICD-10-CM | POA: Diagnosis not present

## 2016-02-27 MED ORDER — OXYCODONE HCL 5 MG PO TABS
5.0000 mg | ORAL_TABLET | ORAL | 0 refills | Status: DC | PRN
Start: 2016-02-27 — End: 2020-02-22

## 2016-02-27 NOTE — Op Note (Signed)
[name], [name] NO.:  [id number]  MEDICAL RECORD NO.:  [id number]  LOCATION:  [facility]                          FACILITY:  [facility]  PHYSICIAN:  Coralie Keens, M.D. DATE OF BIRTH:  September 07, 1940  DATE OF PROCEDURE:  02/26/2016 DATE OF DISCHARGE:                              OPERATIVE REPORT   PREOPERATIVE DIAGNOSIS:  Left breast cancer.  POSTOPERATIVE DIAGNOSIS:  Left breast cancer.  PROCEDURES: 1. Left mastectomy with sentinel lymph node biopsy. 2. Injection of blue dye.  SURGEON:  Coralie Keens, M.D.  ANESTHESIA:  General endotracheal anesthesia and anesthesia provided pectoralis block.  ESTIMATED BLOOD LOSS:  Minimal.  INDICATIONS:  This is a 75 year old female with a large palpable left breast cancer.  She has had a previous lumpectomy with sentinel lymph node biopsy and radiation on the left breast for a separate cancer several years ago.  She has also had reduction of that breast.  Decision was made to proceed with a mastectomy and sentinel lymph node biopsy.  PROCEDURE IN DETAIL:  The patient was brought to the operating room, identified as correct patient.  She was placed supine on the operating table and general anesthesia was induced.  I evaluated the axilla with the Neoprobe and she had already been injected in the preop area with the radioactive isotope.  I saw no increased uptake yet in the axilla; therefore, I injected blue dye after prepping the patient underneath the left areola.  I then massaged the breast.  The patient's left chest and breasts were prepped and draped in usual sterile fashion.  There was a hard palpable mass in the inner medial left breast.  I performed elliptical incision incorporating the overlying skin of the palpable mass and going around the nipple-areolar complex and toward the axilla. I then took this down to the breast tissue with electrocautery.  I then performed the superior skin flap first and close to the  skin edge with the electrocautery and taking all way down to the pectoralis.  I then performed the inferior flap going down to the inframammary ridge with electrocautery as well.  I then took this around toward the axilla.  I then dissected the breast free from the chest wall with electrocautery. I had to take underlying muscle where the mass appeared fixated.  I then stayed on the pectoralis fascia once I was lateral to the mass.  I then completed the entire mastectomy taking the entire left breast.  I then marked the lateral mastectomy site with a silk suture.  This was then sent to Pathology for evaluation.  I then again examined the axilla. There was minimal palpable adenopathy.  I evaluated the Neoprobe and there was minimal uptake in one area.  I grasped this area with an Allis clamp, excised with cautery.  I saw no uptake of blue dye.  This was sent to Pathology as the sentinel lymph node.  I then copiously irrigated the chest wall and axilla with normal saline.  Hemostasis appeared to be achieved.  I made a separate skin incision, placed a 19- [place] Blake drain underneath the mastectomy flaps.  This was sewn in place with 2-0 silk suture.  I  then closed the subcutaneous tissue with interrupted 3-0 Vicryl sutures and closed the skin with a running 4-0 Monocryl.  Skin glue was then applied. An ABD pad and then binder were also applied.  The drain was placed to bulb suction.  The patient tolerated the procedure well.  All the counts were correct at the end of procedure.  The patient was then extubated in the operating room and taken in a stable condition to the recovery room.     Coralie Keens, M.D.     DB/MEDQ  D:  02/26/2016  T:  02/27/2016  Job:  [id number]

## 2016-02-27 NOTE — Discharge Summary (Signed)
Physician Discharge Summary  Patient ID: [name] MRN: [id number] DOB/AGE: Jan 18, 1941 75 y.o.  Admit date: 02/26/2016 Discharge date: 02/27/2016  Admission Diagnoses:  Discharge Diagnoses:  Active Problems:   Breast cancer, left ([place]) dementia  Discharged Condition: good  Hospital Course: uneventful post op recovery  Consults: None  Significant Diagnostic Studies:   Treatments: surgery: left mastectomy with sentinel node biopsy  Discharge Exam: Blood pressure 99/85, pulse 96, temperature 98 F (36.7 C), temperature source Oral, resp. rate 18, height 5' (1.524 m), weight 59.4 kg (130 lb 15.3 oz), SpO2 93 %. General appearance: alert, cooperative and no distress Resp: clear to auscultation bilaterally Cardio: regular rate and rhythm, S1, S2 normal, no murmur, click, rub or gallop Incision/Wound:incision clean, drain serosang  Disposition: 01-Home or Self Care  Discharge Instructions    Ambulatory referral to Home Health    Complete by:  As directed    Please evaluate [name] for admission to [facility].  Disciplines requested: Nursing  Services to provide: Other: drain care, wound check  Physician to follow patient's care (the person listed here will be responsible for signing ongoing orders): Referring Provider  Requested Start of Care Date: Tomorrow  I certify that this patient is under my care and that I, or a Nurse Practitioner or Physician's Assistant working with me, had a face-to-face encounter that meets the physician face-to-face requirements with patient on 02/27/16. The encounter with the patient was in whole, or in part for the following medical condition(s) which is the primary reason for home health care (List medical condition). Left breast cancer  Special Instructions:  Needs help with drain care and recording output until patient comfortable with the drain   Does the patient have Medicare or Medicaid?:  Yes   The encounter with the patient  was in whole, or in part, for the following medical condition, which is the primary reason for home health care:  drain care post mastectomy, mild dementia   Reason for Medically Necessary Home Health Services:  Skilled Nursing- Post-Surgical Wound Assessment and Care   My clinical findings support the need for the above services:  Cognitive impairments, dementia, or mental confusion  that make it unsafe to leave home   I certify that, based on my findings, the following services are medically necessary home health services:  Nursing   Further, I certify that my clinical findings support that this patient is homebound due to:  Mental confusion       Medication List    TAKE these medications   anastrozole 1 MG tablet Commonly known as:  ARIMIDEX Take 1 tablet (1 mg total) by mouth daily.   aspirin 81 MG chewable tablet Chew 81 mg by mouth daily.   carvedilol 6.25 MG tablet Commonly known as:  COREG Take 1 tablet (6.25 mg total) by mouth 2 (two) times daily with a meal.   furosemide 20 MG tablet Commonly known as:  LASIX Take 20 mg by mouth daily.   gabapentin 400 MG capsule Commonly known as:  NEURONTIN Take 400 mg by mouth 3 (three) times daily.   levothyroxine 25 MCG tablet Commonly known as:  SYNTHROID, LEVOTHROID Take 25 mcg by mouth daily before breakfast.   lisinopril 2.5 MG tablet Commonly known as:  PRINIVIL,ZESTRIL Take 1 tablet (2.5 mg total) by mouth daily.   multivitamin with minerals Tabs tablet Take 2 tablets by mouth 3 (three) times daily.   MYRBETRIQ 25 MG Tb24 tablet Generic drug:  mirabegron ER Take 25  mg by mouth daily.   oxyCODONE 5 MG immediate release tablet Commonly known as:  Oxy IR/ROXICODONE Take 1 tablet (5 mg total) by mouth every 4 (four) hours as needed for moderate pain.   potassium chloride SA 20 MEQ tablet Commonly known as:  K-DUR,KLOR-CON Take 20 mEq by mouth daily.   simvastatin 40 MG tablet Commonly known as:  ZOCOR Take 40 mg  by mouth at bedtime.   Tiotropium Bromide Monohydrate 2.5 MCG/ACT Aers Commonly known as:  SPIRIVA RESPIMAT Inhale 2 puffs into the lungs daily.   venlafaxine XR 150 MG 24 hr capsule Commonly known as:  EFFEXOR-XR Take 150 mg by mouth daily with breakfast.      Follow-up Information    [name], MD. Schedule an appointment as soon as possible for a visit on 03/07/2016.   Specialty:  General Surgery Why:  for drain check Contact information: [address] [place] [address] [phone]           Signed: Harl Bowie 02/27/2016, 7:51 AM

## 2016-02-27 NOTE — Discharge Instructions (Signed)
[facility] [place] °[phone] ° °MASTECTOMY: POST OP INSTRUCTIONS ° °Always review your discharge instruction sheet given to you by the facility where your surgery was performed. °IF YOU HAVE DISABILITY OR FAMILY LEAVE FORMS, YOU MUST BRING THEM TO THE OFFICE FOR PROCESSING.   °DO NOT GIVE THEM TO YOUR DOCTOR. °A prescription for pain medication may be given to you upon discharge.  Take your pain medication as prescribed, if needed.  If narcotic pain medicine is not needed, then you may take acetaminophen (Tylenol) or ibuprofen (Advil) as needed. °1. Take your usually prescribed medications unless otherwise directed. °2. If you need a refill on your pain medication, please contact your pharmacy.  They will contact our office to request authorization.  Prescriptions will not be filled after 5pm or on week-ends. °3. You should follow a light diet the first few days after arrival home, such as soup and crackers, etc.  Resume your normal diet the day after surgery. °4. Most patients will experience some swelling and bruising on the chest and underarm.  Ice packs will help.  Swelling and bruising can take several days to resolve.  °5. It is common to experience some constipation if taking pain medication after surgery.  Increasing fluid intake and taking a stool softener (such as Colace) will usually help or prevent this problem from occurring.  A mild laxative (Milk of Magnesia or Miralax) should be taken according to package instructions if there are no bowel movements after 48 hours. °6. Unless discharge instructions indicate otherwise, leave your bandage dry and in place until your next appointment in 3-5 days.  You may take a limited sponge bath.  No tube baths or showers until the drains are removed.  You may have steri-strips (small skin tapes) in place directly over the incision.  These strips should be left on the skin for 7-10 days.  If your surgeon used skin glue on the incision, you may  shower in 24 hours.  The glue will flake off over the next 2-3 weeks.  Any sutures or staples will be removed at the office during your follow-up visit. °7. DRAINS:  If you have drains in place, it is important to keep a list of the amount of drainage produced each day in your drains.  Before leaving the hospital, you should be instructed on drain care.  Call our office if you have any questions about your drains. °8. ACTIVITIES:  You may resume regular (light) daily activities beginning the next day--such as daily self-care, walking, climbing stairs--gradually increasing activities as tolerated.  You may have sexual intercourse when it is comfortable.  Refrain from any heavy lifting or straining until approved by your doctor. °a. You may drive when you are no longer taking prescription pain medication, you can comfortably wear a seatbelt, and you can safely maneuver your car and apply brakes. °b. RETURN TO WORK:  __________________________________________________________ °9. You should see your doctor in the office for a follow-up appointment approximately 3-5 days after your surgery.  Your doctor’s nurse will typically make your follow-up appointment when she calls you with your pathology report.  Expect your pathology report 2-3 business days after your surgery.  You may call to check if you do not hear from us after three days.   °10. OTHER INSTRUCTIONS: ______________________________________________________________________________________________ ____________________________________________________________________________________________ °WHEN TO CALL YOUR DOCTOR: °1. Fever over 101.0 °2. Nausea and/or vomiting °3. Extreme swelling or bruising °4. Continued bleeding from incision. °5. Increased pain, redness, or drainage from the incision. °  The clinic staff is available to answer your questions during regular business hours.  Please don’t hesitate to call and ask to speak to one of the nurses for clinical  concerns.  If you have a medical emergency, go to the nearest emergency room or call 911.  A surgeon from [facility] is always on call at the hospital. °[address], [address], [place], [place]  [address] ? [address] [address], [place], [place]   [address] °[phone] ? [phone] ? FAX [phone] °Web site: [email or web address] °

## 2016-02-27 NOTE — Progress Notes (Signed)
AVS discharge instructions were viewed with patient and her son. Pt was given prescription for oxycodone to take to her pharmacy. Pt and her son were shown how to empty, measure,record and care for JP drain. Pt was also given written instructions on how to care for JP drain. Pt and her son denied any questions or concerns . Volunteers called to assist pt to her transportation.

## 2016-02-27 NOTE — Progress Notes (Signed)
1 Day Post-Op  Subjective: No complaints this morning Had some issue with base line dementia last night  Objective: Vital signs in last 24 hours: Temp:  [97 F (36.1 C)-98 F (36.7 C)] 98 F (36.7 C) (10/04 0441) Pulse Rate:  [86-98] 96 (10/04 0441) Resp:  [13-32] 18 (10/04 0441) BP: (96-125)/(57-106) 99/85 (10/04 0441) SpO2:  [90 %-100 %] 93 % (10/04 0441) Weight:  [59.4 kg (130 lb 15.3 oz)] 59.4 kg (130 lb 15.3 oz) (10/03 1315) Last BM Date: 02/25/16  Intake/Output from previous day: 10/03 0701 - 10/04 0700 In: 930.8 [P.O.:240; I.V.:690.8] Out: 945 [Urine:800; Drains:95; Blood:50] Intake/Output this shift: No intake/output data recorded.  Exam: Awake and alert Chest incision clean, drain serosang No hematoma.  Flaps viable  Lab Results:  No results for input(s): WBC, HGB, HCT, PLT in the last 72 hours. BMET No results for input(s): NA, K, CL, CO2, GLUCOSE, BUN, CREATININE, CALCIUM in the last 72 hours. PT/INR No results for input(s): LABPROT, INR in the last 72 hours. ABG No results for input(s): PHART, HCO3 in the last 72 hours.  Invalid input(s): PCO2, PO2  Studies/Results: Nm Sentinel Node Inj-no Rpt (breast)  Result Date: 02/26/2016 CLINICAL DATA: left breast cancer Sulfur colloid was injected intradermally by the nuclear medicine technologist for breast cancer sentinel node localization.    Anti-infectives: Anti-infectives    Start     Dose/Rate Route Frequency Ordered Stop   02/26/16 0549  ceFAZolin (ANCEF) IVPB 2g/100 mL premix     2 g 200 mL/hr over 30 Minutes Intravenous On call to O.R. 02/26/16 0549 02/26/16 0725      Assessment/Plan: s/p Procedure(s): LEFT MASTECTOMY WITH SENTINEL LYMPH NODE BIOPSY (Left)  Discharge home today Given mild dementia, will need home health for aid with drain care/recording output.  LOS: 0 days    Kaidan Harpster A 02/27/2016

## 2016-02-29 ENCOUNTER — Telehealth: Payer: Self-pay

## 2016-02-29 NOTE — Telephone Encounter (Signed)
Daughter in law called stating that pt had mastectomy to remove the tumor. She is asking if she needs to stop the anastrozole or continue and the reasoning.

## 2016-02-29 NOTE — Telephone Encounter (Signed)
Attempted x 2 to contact pt's daughter-in-law back to discuss her question regarding anastrozole therapy.  Unable to leave VM on number left with triage RN.  Called number listed for pt and unable to reach pt.  VM left with our contact information to call us back to discuss.

## 2016-03-03 ENCOUNTER — Telehealth: Payer: Self-pay | Admitting: Diagnostic Neuroimaging

## 2016-03-03 NOTE — Telephone Encounter (Signed)
Pt said he thinks his mother was dx with dementia but before he places her into assisted living he wants to make sure. Please call

## 2016-03-03 NOTE — Telephone Encounter (Signed)
Spoke with son, Aaron Edelman, on [place] and informed him that per Dr Gladstone Lighter most recent office note, he has not made a definitive diagnosis of dementia in his mother. Informed him that she has a follow up on 03/17/16, and at that appointment Dr Leta Baptist may make another diagnosis determination. Aaron Edelman stated she is very forgetful even though she writes information on her calendar. Advised that may be due to, or partially due to her history of traumatic brain injury and/or hearing loss.  He verbalized understanding, thanked this Therapist, sports for call and information.

## 2016-03-10 ENCOUNTER — Encounter: Payer: Self-pay | Admitting: Hematology and Oncology

## 2016-03-10 ENCOUNTER — Ambulatory Visit (HOSPITAL_BASED_OUTPATIENT_CLINIC_OR_DEPARTMENT_OTHER): Payer: Medicare Other | Admitting: Hematology and Oncology

## 2016-03-10 ENCOUNTER — Telehealth: Payer: Self-pay | Admitting: *Deleted

## 2016-03-10 DIAGNOSIS — F0390 Unspecified dementia without behavioral disturbance: Secondary | ICD-10-CM

## 2016-03-10 DIAGNOSIS — Z170 Estrogen receptor positive status [ER+]: Principal | ICD-10-CM

## 2016-03-10 DIAGNOSIS — C50312 Malignant neoplasm of lower-inner quadrant of left female breast: Secondary | ICD-10-CM | POA: Diagnosis not present

## 2016-03-10 DIAGNOSIS — M899 Disorder of bone, unspecified: Secondary | ICD-10-CM

## 2016-03-10 MED ORDER — ANASTROZOLE 1 MG PO TABS: 1.0000 mg | ORAL_TABLET | Freq: Every day | ORAL | 3 refills | Status: DC

## 2016-03-10 NOTE — Telephone Encounter (Signed)
"  I called and left message to cancel today's appointment.  DO NOT CANCEL.  My son will bring me in at 1:15 pm." Collaborative notified.

## 2016-03-10 NOTE — Progress Notes (Signed)
Patient Care Team: [name], MD as PCP - General (Family Medicine)  DIAGNOSIS:  Encounter Diagnosis  Name Primary?  . Malignant neoplasm of lower-inner quadrant of left breast in female, estrogen receptor positive ([place])     SUMMARY OF ONCOLOGIC HISTORY:   Breast cancer of lower-inner quadrant of left female breast ([place])   01/29/1999 Initial Diagnosis    Left breast cancer treated with lumpectomy, radiation, systemic chemotherapy for 6 months, unknown stage and unknown receptor status      12/13/2015 Mammogram    Left mammogram and ultrasound: 1.6 cm oval lesion Microlobated posterior depth, measured 1.7 cm by ultrasound, T1 CN 0 stage IA clinical stage      12/27/2015 Initial Biopsy    Left breast biopsy: IDC grade 3, ER 30%, PR less than 1%, HER-2 negative, Ki-67 40%      02/26/2016 Surgery    Left mastectomy: IDC grade 3, 2.5 cm, ER 35%, PR less than 1%, HER-2 negative ratio 1.14, Ki-67 40%, T2 N0 stage II a; ILC grade 1, 0.2 cm, ALH, margins negative, 0/1 lymph nodes, ER 95%, PR 90%, Ki-67 2%, HER-2 negative ratio 1.15, T1 1 N0 stage IA       CHIEF COMPLIANT: Follow-up after recent left mastectomy  INTERVAL HISTORY: [name] is a 75 year old lady with above-mentioned history of left breast cancer. She had a prior history of left breast cancer in 2000. The staging and receptor status were normal at that time. On initial biopsy she had clinical stage IA left breast cancer that was invasive ductal. On the final pathology specimen she appears to have 2 different times of breast cancers. One is invasive ductal cancer measuring 2.5 cm that is ER/PR positive with a Ki-67 40% and is a stage II disease. The second tumor is invasive lobular cancer that is extremely small measuring 0.2 cm there is ER/PR positive and HER-2 negative with a Ki-67 2%. The margins were cleared 1 lymph nodes negative. She is here today to discuss the adjuvant treatment plan and review the pathology  report.  REVIEW OF SYSTEMS:   Constitutional: Denies fevers, chills or abnormal weight loss Eyes: Denies blurriness of vision Ears, nose, mouth, throat, and face: Denies mucositis or sore throat Respiratory: Denies cough, dyspnea or wheezes Cardiovascular: Denies palpitation, chest discomfort Gastrointestinal:  Denies nausea, heartburn or change in bowel habits Skin: Denies abnormal skin rashes Lymphatics: Denies new lymphadenopathy or easy bruising Neurological:Denies numbness, tingling or new weaknesses Behavioral/Psych: Mood is stable, no new changes  Extremities: No lower extremity edema Breast: Left mastectomy All other systems were reviewed with the patient and are negative.  I have reviewed the past medical history, past surgical history, social history and family history with the patient and they are unchanged from previous note.  ALLERGIES:  has No Known Allergies.  MEDICATIONS:  Current Outpatient Prescriptions  Medication Sig Dispense Refill  . anastrozole (ARIMIDEX) 1 MG tablet Take 1 tablet (1 mg total) by mouth daily. 90 tablet 3  . aspirin 81 MG chewable tablet Chew 81 mg by mouth daily.    . carvedilol (COREG) 6.25 MG tablet Take 1 tablet (6.25 mg total) by mouth 2 (two) times daily with a meal. 60 tablet 0  . furosemide (LASIX) 20 MG tablet Take 20 mg by mouth daily.     Marland Kitchen gabapentin (NEURONTIN) 400 MG capsule Take 400 mg by mouth 3 (three) times daily.    Marland Kitchen levothyroxine (SYNTHROID, LEVOTHROID) 25 MCG tablet Take 25 mcg by mouth  daily before breakfast.     . lisinopril (PRINIVIL,ZESTRIL) 2.5 MG tablet Take 1 tablet (2.5 mg total) by mouth daily. (Patient not taking: Reported on 02/14/2016) 30 tablet 0  . Multiple Vitamin (MULTIVITAMIN WITH MINERALS) TABS tablet Take 2 tablets by mouth 3 (three) times daily.    Marland Kitchen MYRBETRIQ 25 MG TB24 tablet Take 25 mg by mouth daily.     Marland Kitchen oxyCODONE (OXY IR/ROXICODONE) 5 MG immediate release tablet Take 1 tablet (5 mg total) by mouth  every 4 (four) hours as needed for moderate pain. 30 tablet 0  . potassium chloride SA (K-DUR,KLOR-CON) 20 MEQ tablet Take 20 mEq by mouth daily.     . simvastatin (ZOCOR) 40 MG tablet Take 40 mg by mouth at bedtime.     . Tiotropium Bromide Monohydrate (SPIRIVA RESPIMAT) 2.5 MCG/ACT AERS Inhale 2 puffs into the lungs daily. 1 Inhaler 5  . venlafaxine XR (EFFEXOR-XR) 150 MG 24 hr capsule Take 150 mg by mouth daily with breakfast.     No current facility-administered medications for this visit.     PHYSICAL EXAMINATION: ECOG PERFORMANCE STATUS: 1 - Symptomatic but completely ambulatory  Vitals:   03/10/16 1400  BP: 109/65  Pulse: 95  Resp: 18  Temp: 97.3 F (36.3 C)   Filed Weights   03/10/16 1400  Weight: 129 lb 11.2 oz (58.8 kg)    GENERAL:alert, no distress and comfortable SKIN: skin color, texture, turgor are normal, no rashes or significant lesions EYES: normal, Conjunctiva are pink and non-injected, sclera clear OROPHARYNX:no exudate, no erythema and lips, buccal mucosa, and tongue normal  NECK: supple, thyroid normal size, non-tender, without nodularity LYMPH:  no palpable lymphadenopathy in the cervical, axillary or inguinal LUNGS: clear to auscultation and percussion with normal breathing effort HEART: regular rate & rhythm and no murmurs and no lower extremity edema ABDOMEN:abdomen soft, non-tender and normal bowel sounds MUSCULOSKELETAL:no cyanosis of digits and no clubbing  NEURO: alert & oriented x 3 with fluent speech, no focal motor/sensory deficits EXTREMITIES: No lower extremity edema  LABORATORY DATA:  I have reviewed the data as listed   Chemistry      Component Value Date/Time   NA 140 02/18/2016 1048   K 3.8 02/18/2016 1048   CL 101 02/18/2016 1048   CO2 29 02/18/2016 1048   BUN 8 02/18/2016 1048   CREATININE 0.78 02/18/2016 1048      Component Value Date/Time   CALCIUM 9.6 02/18/2016 1048   ALKPHOS 74 09/27/2015 0715   AST 21 09/27/2015 0715    ALT 16 09/27/2015 0715   BILITOT 0.6 09/27/2015 0715       Lab Results  Component Value Date   WBC 9.3 02/18/2016   HGB 12.6 02/18/2016   HCT 38.8 02/18/2016   MCV 88.4 02/18/2016   PLT 269 02/18/2016   NEUTROABS 2.9 09/27/2015     ASSESSMENT & PLAN:  Breast cancer of lower-inner quadrant of left female breast ([place]) Left mastectomy 02/26/2016:  IDC grade 3, 2.5 cm, ER 35%, PR less than 1%, HER-2 negative ratio 1.14, Ki-67 40%, T2 N0 stage II a;  ILC grade 1, 0.2 cm, ALH, marg neg, 0/1 lymph nodes, ER 95%, PR 90%, Ki-67 2%, HER-2 neg ratio 1.15, T1a N0 stage IA  Pathology counseling: I discussed the final pathology report of the patient provided  a copy of this report. I discussed the margins as well as lymph node surgeries. We also discussed the final staging along with previously performed ER/PR and  HER-2/neu testing.  Treatment plan: Adjuvant antiestrogen therapy with Anastrozole 1 mg daily 5 years (started neoadjuvant chemotherapy 01/29/2016)  Anastrozole toxicities: Patient did not have any hot flashes or myalgias. Patient was apparently told that she has low bone density and was put on Fosamax and calcium and vitamin D. She does not remember taking it. She has dementia. Her son told her that she does have the prescription at home. He will make sure that she takes it.  Return to clinic in 6 months for follow-up   No orders of the defined types were placed in this encounter.  The patient has a good understanding of the overall plan. she agrees with it. she will call with any problems that may develop before the next visit here.   Rulon Eisenmenger, MD 03/10/16

## 2016-03-10 NOTE — Assessment & Plan Note (Signed)
Left mastectomy 02/26/2016:  IDC grade 3, 2.5 cm, ER 35%, PR less than 1%, HER-2 negative ratio 1.14, Ki-67 40%, T2 N0 stage II a;  ILC grade 1, 0.2 cm, ALH, marg neg, 0/1 lymph nodes, ER 95%, PR 90%, Ki-67 2%, HER-2 neg ratio 1.15, T1a N0 stage IA  Pathology counseling: I discussed the final pathology report of the patient provided  a copy of this report. I discussed the margins as well as lymph node surgeries. We also discussed the final staging along with previously performed ER/PR and HER-2/neu testing.  Treatment plan: Adjuvant antiestrogen therapy with Anastrozole 1 mg daily 5 years (started neoadjuvant chemotherapy 01/29/2016)  Anastrozole toxicities:  Return to clinic in 6 months for follow-up

## 2016-03-13 ENCOUNTER — Encounter: Payer: Self-pay | Admitting: Emergency Medicine

## 2016-03-13 ENCOUNTER — Ambulatory Visit (INDEPENDENT_AMBULATORY_CARE_PROVIDER_SITE_OTHER): Payer: Medicare Other | Admitting: Emergency Medicine

## 2016-03-13 DIAGNOSIS — J449 Chronic obstructive pulmonary disease, unspecified: Secondary | ICD-10-CM

## 2016-03-13 DIAGNOSIS — R059 Cough, unspecified: Secondary | ICD-10-CM

## 2016-03-13 DIAGNOSIS — R05 Cough: Secondary | ICD-10-CM

## 2016-03-13 MED ORDER — ALBUTEROL SULFATE HFA 108 (90 BASE) MCG/ACT IN AERS: 2.0000 | INHALATION_SPRAY | RESPIRATORY_TRACT | 5 refills | Status: DC | PRN

## 2016-03-13 NOTE — Addendum Note (Signed)
Addended by: Benson Setting L on: 03/13/2016 03:21 PM   Modules accepted: Orders

## 2016-03-13 NOTE — Assessment & Plan Note (Signed)
Contribution of GERD. Also note she is on an ACE inhibitor. I asked her to discuss this with her primary care physician.

## 2016-03-13 NOTE — Progress Notes (Signed)
Subjective:    Patient ID: [name], female    DOB: 1941-05-14, 75 y.o.   MRN: [id number]  HPI Patient is a 75 year old former smoker (30 pack-yrs) with a history of left-sided breast cancer treated in 2000, now with newly diagnosed recurrent left sided breast cancer confirmed by biopsy 12/27/15. She also has a history of Hypertension and diastolic CHF. She was seen in consultation by Dr. Johnsie Cancel with cardiology on 01/15/16. An echocardiogram on 01/23/16 Was fairly well compensated with preserved left ventricular ejection fraction, some abnormal left ventricular relaxation, normal RV function and normal PA pressures.   She wears nocturnal oxygen, this was started by her family MD in [place] several years ago.  CTPA was performed 09/27/15 in the setting of substernal chest pain. This showed no evidence of pulmonary embolism, some mild right greater than left basilar scar without overt infiltrates and without any evidence for pneumonitis. Pulmonary function testing was performed on 01/17/16 and I have personally reviewed. This shows moderate to severe obstruction without a bronchodilator response, profound hyperinflation, And a decreased diffusion capacity consistent with COPD. She has been on BD's before in setting bronchitis.  She has some exertional SOB even with short distances. Able to do some housework. Able to grocery shop.   She has nocturnal cough and some GERD symptoms. Can wake her from sleep. No real cough during the day. She notes that she has had GERD before.   ROV 03/13/16 -- Patient has a history of tobacco use and recurrent breast cancer diagnosed 12/27/15. I saw her a month ago to assess her for dyspnea and a preoperative evaluation for breast surgery. Her pulmonary function testing and clinical history were consistent with significant COPD. We started her on Spiriva Respimat. She also had a hx consistent with nocturnal GERD and cough. Her son has noticed that she is less dyspneic with  exertion. Pt is not so sure whether she has benefited. Her cough may also be better. She is still coughing at night.    Review of Systems  Constitutional: Negative for fever and unexpected weight change.  HENT: Negative for congestion, dental problem, ear pain, nosebleeds, postnasal drip, rhinorrhea, sinus pressure, sneezing, sore throat and trouble swallowing.   Eyes: Negative for redness and itching.  Respiratory: Positive for shortness of breath. Negative for cough, chest tightness and wheezing.   Cardiovascular: Negative for palpitations and leg swelling.  Gastrointestinal: Negative for nausea and vomiting.  Genitourinary: Negative for dysuria.  Musculoskeletal: Negative for joint swelling.  Skin: Negative for rash.  Neurological: Negative for headaches.  Hematological: Does not bruise/bleed easily.  Psychiatric/Behavioral: Negative for dysphoric mood. The patient is not nervous/anxious.         Objective:   Physical Exam Vitals:   03/13/16 1449  BP: 98/60  Pulse: (!) 103  SpO2: 94%  Weight: 129 lb 12.8 oz (58.9 kg)  Height: 5' (1.524 m)   Gen: Pleasant, well-nourished, in no distress,  normal affect  ENT: No lesions,  mouth clear,  oropharynx clear, no postnasal drip  Neck: No JVD, no TMG, no carotid bruits  Lungs: No use of accessory muscles, no dullness to percussion, clear without rales or rhonchi  Cardiovascular: RRR, heart sounds normal, no murmur or gallops, no peripheral edema  Musculoskeletal: No deformities, no cyanosis or clubbing  Neuro: alert, non focal  Skin: Warm, no lesions or rashes    09/27/15 --  COMPARISON:  None.  FINDINGS: No evidence of acute pulmonary thromboembolism.  No evidence of aortic  aneurysm or dissection.  Three vessel coronary artery calcification. Mildly enlarged AP window lymph node on image 28 series 5 is stable dating back to 2015  No pneumothorax.  No pleural effusion.  Right upper lobe calcified granuloma on  image 25 of series 8. Emphysema. Dependent atelectasis bilaterally.  Calcified granulomata are scattered throughout the spleen.  No acute vertebral compression deformity.  No rib fracture.  Review of the MIP images confirms the above findings.  IMPRESSION: No evidence of acute pulmonary thromboembolism.  Chronic changes.     Assessment & Plan:  COPD (chronic obstructive pulmonary disease) ([place]) Fairly new diagnosis of significant COPD. She appears to have benefited from the [place]. We will continue this for another 6 months to see how she does. If she does well we will continue it indefinitely. We will also teach her how to use albuterol as a rescue inhaler. Flu shot is up-to-date. Follow-up 6 months.  Cough Contribution of GERD. Also note she is on an ACE inhibitor. I asked her to discuss this with her primary care physician.  Baltazar Apo, MD, PhD 03/13/2016, 3:17 PM [facility] [phone] or if no answer [phone]

## 2016-03-13 NOTE — Assessment & Plan Note (Addendum)
Fairly new diagnosis of significant COPD. She appears to have benefited from the [place]. We will continue this for another 6 months to see how she does. If she does well we will continue it indefinitely. We will also teach her how to use albuterol as a rescue inhaler. Flu shot is up-to-date. Follow-up 6 months.

## 2016-03-13 NOTE — Patient Instructions (Signed)
We will continue your Spiriva 2 puffs once a day  Flu shot up to date.  We will start albuterol 2 puffs up to every 4 hours if needed for shortness of breath.  Follow with Dr Lamonte Sakai in 6 months or sooner if you have any problems

## 2016-03-17 ENCOUNTER — Ambulatory Visit (INDEPENDENT_AMBULATORY_CARE_PROVIDER_SITE_OTHER): Payer: Medicare Other | Admitting: Diagnostic Neuroimaging

## 2016-03-17 ENCOUNTER — Encounter (INDEPENDENT_AMBULATORY_CARE_PROVIDER_SITE_OTHER): Payer: Self-pay

## 2016-03-17 ENCOUNTER — Encounter: Payer: Self-pay | Admitting: Diagnostic Neuroimaging

## 2016-03-17 VITALS — BP 108/70 | HR 97 | Wt 129.0 lb

## 2016-03-17 DIAGNOSIS — F03A0 Unspecified dementia, mild, without behavioral disturbance, psychotic disturbance, mood disturbance, and anxiety: Secondary | ICD-10-CM

## 2016-03-17 DIAGNOSIS — F0390 Unspecified dementia without behavioral disturbance: Secondary | ICD-10-CM | POA: Diagnosis not present

## 2016-03-17 DIAGNOSIS — H918X3 Other specified hearing loss, bilateral: Secondary | ICD-10-CM | POA: Diagnosis not present

## 2016-03-17 DIAGNOSIS — R413 Other amnesia: Secondary | ICD-10-CM

## 2016-03-17 MED ORDER — MEMANTINE HCL 10 MG PO TABS: 10.0000 mg | ORAL_TABLET | Freq: Two times a day (BID) | ORAL | 12 refills | Status: AC

## 2016-03-17 NOTE — Patient Instructions (Signed)
-   start memantine 10mg  at bedtime; after 1 week increase to twice a day
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
## 2016-03-31 ENCOUNTER — Telehealth: Payer: Self-pay | Admitting: Diagnostic Neuroimaging

## 2016-03-31 NOTE — Telephone Encounter (Signed)
Called Santiago Glad and  Advised her, per Dr Leta Baptist that he does not feel her vision issue is from [place],. However she may stop the medication for 1- 1/2 weeks to see if symptoms resolve. Santiago Glad stated the patient has stated "I can't see" a few times. She then stated when patient's son was talking to her on the phone about her computer, the patient was able to see her computer and read from it. Santiago Glad stated she is unsure of what type vision issues patient is actually having. Patient is now living in  [facility] assisted living in [place].  She stated she would call there to find out if the medication can be stopped without a dr's order. She will call back if necessary. She verbalized understanding, appreciation of call.

## 2016-03-31 NOTE — Telephone Encounter (Signed)
Patient's daughter-in-law Santiago Glad is calling stating since taking memantine (NAMENDA) 10 MG tablet a week ago there has been a change in the patient's vision. Please call to discuss

## 2016-04-11 NOTE — Op Note (Addendum)
Mary Keens, MD Physician Signed Surgery  Op Note Date of Service: 02/27/2016 4:15 AM  Related encounter: NM SENTINEL NODE INJ-NO RPT from 02/26/2016 in [place]        NAME:  [name] NO.:  [id number]  MEDICAL RECORD NO.:  [id number]  LOCATION:  [facility]                          FACILITY:  [facility]  PHYSICIAN:  [name], M.D. DATE OF BIRTH:  [birth year]  DATE OF PROCEDURE:  02/26/2016 DATE OF DISCHARGE:                              OPERATIVE REPORT   PREOPERATIVE DIAGNOSIS:  Left breast cancer.  POSTOPERATIVE DIAGNOSIS:  Left breast cancer.  PROCEDURES: 1. Left mastectomy with deep sentinel lymph node biopsy. 2. Injection of blue dye.  SURGEON:  [name], M.D.  ANESTHESIA:  General endotracheal anesthesia and anesthesia provided pectoralis block.  ESTIMATED BLOOD LOSS:  Minimal.  INDICATIONS:  This is a 75 year old female with a large palpable left breast cancer.  She has had a previous lumpectomy with sentinel lymph node biopsy and radiation on the left breast for a separate cancer several years ago.  She has also had reduction of that breast.  Decision was made to proceed with a mastectomy and sentinel lymph node biopsy.  PROCEDURE IN DETAIL:  The patient was brought to the operating room, identified as correct patient.  She was placed supine on the operating table and general anesthesia was induced.  I evaluated the axilla with the Neoprobe and she had already been injected in the preop area with the radioactive isotope.  I saw no increased uptake yet in the axilla; therefore, I injected blue dye after prepping the patient underneath the left areola.  I then massaged the breast.  The patient's left chest and breasts were prepped and draped in usual sterile fashion.  There was a hard palpable mass in the inner medial left breast.  I performed elliptical incision incorporating the  overlying skin of the palpable mass and going around the nipple-areolar complex and toward the axilla. I then took this down to the breast tissue with electrocautery.  I then performed the superior skin flap first and close to the skin edge with the electrocautery and taking all way down to the pectoralis.  I then performed the inferior flap going down to the inframammary ridge with electrocautery as well.  I then took this around toward the axilla.  I then dissected the breast free from the chest wall with electrocautery. I had to take underlying muscle where the mass appeared fixated.  I then stayed on the pectoralis fascia once I was lateral to the mass.  I then completed the entire mastectomy taking the entire left breast.  I then marked the lateral mastectomy site with a silk suture.  This was then sent to Pathology for evaluation.  I then again examined the deep axilla. There was minimal palpable adenopathy.  I evaluated the Neoprobe and there was minimal uptake in one area.  I grasped this deep axillary area with an Allis clamp, excised with cautery.  I saw no uptake of blue dye.  This was sent to Pathology as the sentinel lymph node.  I then copiously irrigated  the chest wall and axilla with normal saline.  Hemostasis appeared to be achieved.  I made a separate skin incision, placed a 19- [place] Blake drain underneath the mastectomy flaps.  This was sewn in place with 2-0 silk suture.  I then closed the subcutaneous tissue with interrupted 3-0 Vicryl sutures and closed the skin with a running 4-0 Monocryl.  Skin glue was then applied. An ABD pad and then binder were also applied.  The drain was placed to bulb suction.  The patient tolerated the procedure well.  All the counts were correct at the end of procedure.  The patient was then extubated in the operating room and taken in a stable condition to the recovery room.     [name], M.D.

## 2016-04-14 NOTE — Op Note (Signed)
NAMEBERNICE, GENETTI NO.:  [id number]  MEDICAL RECORD NO.:  [id number]  LOCATION:  [facility]                          FACILITY:  [facility]  PHYSICIAN:  Coralie Keens, M.D. DATE OF BIRTH:  [birth year]  DATE OF PROCEDURE:  02/26/2016 DATE OF DISCHARGE:                              OPERATIVE REPORT   PREOPERATIVE DIAGNOSIS:  Left breast cancer.  POSTOPERATIVE DIAGNOSIS:  Left breast cancer.  PROCEDURES: 1. Left mastectomy with deep axillary sentinel lymph node biopsy. 2. Injection of blue dye.  SURGEON:  Coralie Keens, M.D.  ANESTHESIA:  General endotracheal anesthesia and anesthesia provided pectoralis block.  ESTIMATED BLOOD LOSS:  Minimal.  INDICATIONS:  This is a 75 year old female with a large palpable left breast cancer.  She has had a previous lumpectomy with sentinel lymph node biopsy and radiation on the left breast for a separate cancer several years ago.  She has also had reduction of that breast.  Decision was made to proceed with a mastectomy and deep sentinel lymph node biopsy.  PROCEDURE IN DETAIL:  The patient was brought to the operating room, identified as correct patient.  She was placed supine on the operating table and general anesthesia was induced.  I evaluated the axilla with the Neoprobe and she had already been injected in the preop area with the radioactive isotope.  I saw no increased uptake yet in the axilla; therefore, I injected blue dye after prepping the patient underneath the left areola.  I then massaged the breast.  The patient's left chest and breasts were prepped and draped in usual sterile fashion.  There was a hard palpable mass in the inner medial left breast.  I performed elliptical incision incorporating the overlying skin of the palpable mass and going around the nipple-areolar complex and toward the axilla. I then took this down to the breast tissue with electrocautery.  I then performed the superior  skin flap first and close to the skin edge with the electrocautery and taking all way down to the pectoralis.  I then performed the inferior flap going down to the inframammary ridge with electrocautery as well.  I then took this around toward the axilla.  I then dissected the breast free from the chest wall with electrocautery. I had to take underlying muscle where the mass appeared fixated.  I then stayed on the pectoralis fascia once I was lateral to the mass.  I then completed the entire mastectomy taking the entire left breast.  I then marked the lateral mastectomy site with a silk suture.  This was then sent to Pathology for evaluation.  I then again examined the axilla. There was minimal palpable adenopathy.  I evaluated the Neoprobe deep in the axilla and there was minimal uptake in one area.  I grasped the deep axillary tissue with an Allis clamp, excised deep axillary nodes with cautery.  I saw no uptake of blue dye.  This was sent to Pathology as the sentinel lymph node.  I then copiously irrigated the chest wall and axilla with normal saline.  Hemostasis appeared to be achieved.  I made a separate skin incision, placed a 19- [place] Blake drain underneath the mastectomy flaps.  This was sewn in place with 2-0 silk suture.  I then closed the subcutaneous tissue with interrupted 3-0 Vicryl sutures and closed the skin with a running 4-0 Monocryl.  Skin glue was then applied. An ABD pad and then binder were also applied.  The drain was placed to bulb suction.  The patient tolerated the procedure well.  All the counts were correct at the end of procedure.  The patient was then extubated in the operating room and taken in a stable condition to the recovery room.     Coralie Keens, M.D.

## 2016-05-30 ENCOUNTER — Encounter: Payer: Self-pay | Admitting: *Deleted

## 2016-05-30 ENCOUNTER — Telehealth: Payer: Self-pay | Admitting: Diagnostic Neuroimaging

## 2016-05-30 NOTE — Telephone Encounter (Signed)
Spoke with daughter Santiago Glad who stated her mother is currently living at [facility] on [address]. She is not in memory care. Santiago Glad stated "She is not there yet."  Santiago Glad stated patient's long term  insurance co is stating that she does not need to live in assisted living. [employer] insurance is not wanting to continue to pay for her care/accomadations at [facility]. Santiago Glad stated Lorenda Cahill needs a letter documenting the patient has cognitive impairment, and the symptoms make it unsafe for her to live at home. She must have supervision to ensure she is taking her medications as prescribed and eating her meals. Santiago Glad requested letter be mailed to her, and she will forward it to [facility]. This RN stated will inform Dr Leta Baptist. If he agrees with letter, she will not get a call back. She verbalized understanding, appreciation.

## 2016-05-30 NOTE — Telephone Encounter (Addendum)
Pt's daughter-in-law called said she needs a letter documenting the patient has cognitive impairment, the symptoms make her unsafe to live by herself and needs to be in an assisted living situation. An ex: She said at Christmas the pt was at their house and she asked if there was a bathroom downstairs, at the time she was standing 5 feet from it and knows it was there in the past. She said if more examples are needed please call.  She said they are worried about her financial state also: she said  from January 2017 to Thanksgiving 2017 she had 240 packages delivered from [employer]. Pt's son has taken call her credit cards except for 1 and that is used for Dr's appts.  Says if she can not be reached to pls call pt's con Aaron Edelman at [phone]

## 2016-05-30 NOTE — Telephone Encounter (Signed)
Letter composed,  Reviewed and signed by Dr Leta Baptist. Sent to MR to be mailed to daughter, Santiago Glad.

## 2016-08-15 ENCOUNTER — Telehealth: Payer: Self-pay | Admitting: Adult Health

## 2016-08-15 NOTE — Telephone Encounter (Signed)
No answer- unable to leave message sent letter out . And will put a note in for patient next appt.

## 2016-09-05 ENCOUNTER — Ambulatory Visit: Payer: Medicare Other | Admitting: Emergency Medicine

## 2016-09-08 ENCOUNTER — Encounter: Payer: Self-pay | Admitting: Hematology and Oncology

## 2016-09-08 ENCOUNTER — Ambulatory Visit (HOSPITAL_BASED_OUTPATIENT_CLINIC_OR_DEPARTMENT_OTHER): Payer: Medicare Other | Admitting: Hematology and Oncology

## 2016-09-08 DIAGNOSIS — F0390 Unspecified dementia without behavioral disturbance: Secondary | ICD-10-CM | POA: Diagnosis not present

## 2016-09-08 DIAGNOSIS — Z170 Estrogen receptor positive status [ER+]: Secondary | ICD-10-CM

## 2016-09-08 DIAGNOSIS — C50312 Malignant neoplasm of lower-inner quadrant of left female breast: Secondary | ICD-10-CM

## 2016-09-08 DIAGNOSIS — Z79811 Long term (current) use of aromatase inhibitors: Secondary | ICD-10-CM | POA: Diagnosis not present

## 2016-09-08 NOTE — Assessment & Plan Note (Signed)
Left mastectomy 02/26/2016:  IDC grade 3, 2.5 cm, ER 35%, PR less than 1%, HER-2 negative ratio 1.14, Ki-67 40%, T2 N0 stage II a;  ILC grade 1, 0.2 cm, ALH, marg neg, 0/1 lymph nodes, ER 95%, PR 90%, Ki-67 2%, HER-2 neg ratio 1.15, T1a N0 stage IA  Treatment plan: Adjuvant antiestrogen therapy with Anastrozole 1 mg daily 5 years (started neoadjuvantly 01/29/2016)  Anastrozole toxicities: Patient did not have any hot flashes or myalgias. on Fosamax and calcium and vitamin D.  She has dementia.  Return to clinic in 1 year for follow-up

## 2016-09-08 NOTE — Progress Notes (Signed)
Patient Care Team: [name], MD as PCP - General (Family Medicine)  DIAGNOSIS:  Encounter Diagnosis  Name Primary?  . Malignant neoplasm of lower-inner quadrant of left breast in female, estrogen receptor positive ([place])     SUMMARY OF ONCOLOGIC HISTORY:   Breast cancer of lower-inner quadrant of left female breast ([place])   01/29/1999 Initial Diagnosis    Left breast cancer treated with lumpectomy, radiation, systemic chemotherapy for 6 months, unknown stage and unknown receptor status      12/13/2015 Mammogram    Left mammogram and ultrasound: 1.6 cm oval lesion Microlobated posterior depth, measured 1.7 cm by ultrasound, T1 CN 0 stage IA clinical stage      12/27/2015 Initial Biopsy    Left breast biopsy: IDC grade 3, ER 30%, PR less than 1%, HER-2 negative, Ki-67 40%      01/29/2016 -  Anti-estrogen oral therapy    Anastrozole 1 mg daily      02/26/2016 Surgery    Left mastectomy: IDC grade 3, 2.5 cm, ER 35%, PR less than 1%, HER-2 negative ratio 1.14, Ki-67 40%, T2 N0 stage II a; ILC grade 1, 0.2 cm, ALH, margins negative, 0/1 lymph nodes, ER 95%, PR 90%, Ki-67 2%, HER-2 negative ratio 1.15, T1 1 N0 stage IA       CHIEF COMPLIANT: Follow-up of left breast cancer on anastrozole  INTERVAL HISTORY: [name] is a 76 year old with above-mentioned history of left breast cancer treated with mastectomy and is currently on oral antiestrogen therapy with anastrozole. She is tolerating it extremely well. She does not have any major problems or discomfort tolerating it. She denies any hot flashes or myalgias. She does have dementia and lives in assisted living facility and does not control her medications by herself. So she is not clear on what she is taking. She has also been on bisphosphonate therapy and I'm not certain whether she is getting that medication.  REVIEW OF SYSTEMS:   Constitutional: Denies fevers, chills or abnormal weight loss Eyes: Denies blurriness of  vision Ears, nose, mouth, throat, and face: Denies mucositis or sore throat Respiratory: Denies cough, dyspnea or wheezes Cardiovascular: Denies palpitation, chest discomfort Gastrointestinal:  Denies nausea, heartburn or change in bowel habits Skin: Denies abnormal skin rashes Lymphatics: Denies new lymphadenopathy or easy bruising Neurological:Denies numbness, tingling or new weaknesses Behavioral/Psych: Mood is stable, no new changes  Extremities: No lower extremity edema Breast:  denies any pain or lumps or nodules in either breasts All other systems were reviewed with the patient and are negative.  I have reviewed the past medical history, past surgical history, social history and family history with the patient and they are unchanged from previous note.  ALLERGIES:  has No Known Allergies.  MEDICATIONS:  Current Outpatient Prescriptions  Medication Sig Dispense Refill  . albuterol (PROVENTIL HFA;VENTOLIN HFA) 108 (90 Base) MCG/ACT inhaler Inhale 2 puffs into the lungs every 4 (four) hours as needed for wheezing or shortness of breath. 1 Inhaler 5  . alendronate (FOSAMAX) 70 MG tablet 70 mg every 7 (seven) days. 03/17/16 has not started yet    . anastrozole (ARIMIDEX) 1 MG tablet Take 1 tablet (1 mg total) by mouth daily. 90 tablet 3  . aspirin 81 MG chewable tablet Chew 81 mg by mouth daily.    . carvedilol (COREG) 6.25 MG tablet Take 1 tablet (6.25 mg total) by mouth 2 (two) times daily with a meal. 60 tablet 0  . furosemide (LASIX) 20 MG tablet  Take 20 mg by mouth daily.     . levothyroxine (SYNTHROID, LEVOTHROID) 25 MCG tablet Take 25 mcg by mouth daily before breakfast.     . lisinopril (PRINIVIL,ZESTRIL) 2.5 MG tablet Take 1 tablet (2.5 mg total) by mouth daily. 30 tablet 0  . memantine (NAMENDA) 10 MG tablet Take 1 tablet (10 mg total) by mouth 2 (two) times daily. 60 tablet 12  . Multiple Vitamin (MULTIVITAMIN WITH MINERALS) TABS tablet Take 2 tablets by mouth 3 (three)  times daily.    . MYRBETRIQ 25 MG TB24 tablet Take 25 mg by mouth daily.     . oxyCODONE (OXY IR/ROXICODONE) 5 MG immediate release tablet Take 1 tablet (5 mg total) by mouth every 4 (four) hours as needed for moderate pain. 30 tablet 0  . potassium chloride SA (K-DUR,KLOR-CON) 20 MEQ tablet Take 20 mEq by mouth daily.     . simvastatin (ZOCOR) 40 MG tablet Take 40 mg by mouth at bedtime.     . Tiotropium Bromide Monohydrate (SPIRIVA RESPIMAT) 2.5 MCG/ACT AERS Inhale 2 puffs into the lungs daily. 1 Inhaler 5  . venlafaxine XR (EFFEXOR-XR) 150 MG 24 hr capsule Take 150 mg by mouth daily with breakfast.     No current facility-administered medications for this visit.     PHYSICAL EXAMINATION: ECOG PERFORMANCE STATUS: 1 - Symptomatic but completely ambulatory  Vitals:   09/08/16 1035  BP: 117/73  Pulse: 93  Resp: 18  Temp: 97.6 F (36.4 C)   Filed Weights   09/08/16 1035  Weight: 134 lb 12.8 oz (61.1 kg)    GENERAL:alert, no distress and comfortable SKIN: skin color, texture, turgor are normal, no rashes or significant lesions EYES: normal, Conjunctiva are pink and non-injected, sclera clear OROPHARYNX:no exudate, no erythema and lips, buccal mucosa, and tongue normal  NECK: supple, thyroid normal size, non-tender, without nodularity LYMPH:  no palpable lymphadenopathy in the cervical, axillary or inguinal LUNGS: clear to auscultation and percussion with normal breathing effort HEART: regular rate & rhythm and no murmurs and no lower extremity edema ABDOMEN:abdomen soft, non-tender and normal bowel sounds MUSCULOSKELETAL:no cyanosis of digits and no clubbing  NEURO: alert & oriented x 3 with fluent speech, no focal motor/sensory deficits EXTREMITIES: No lower extremity edema BREAST: No palpable lumps or nodules in the right breast. No palpable lesions in the left chest wall or axilla. No palpable axillary supraclavicular or infraclavicular adenopathy no breast tenderness or nipple  discharge. (exam performed in the presence of a chaperone)  LABORATORY DATA:  I have reviewed the data as listed   Chemistry      Component Value Date/Time   NA 140 02/18/2016 1048   K 3.8 02/18/2016 1048   CL 101 02/18/2016 1048   CO2 29 02/18/2016 1048   BUN 8 02/18/2016 1048   CREATININE 0.78 02/18/2016 1048      Component Value Date/Time   CALCIUM 9.6 02/18/2016 1048   ALKPHOS 74 09/27/2015 0715   AST 21 09/27/2015 0715   ALT 16 09/27/2015 0715   BILITOT 0.6 09/27/2015 0715       Lab Results  Component Value Date   WBC 9.3 02/18/2016   HGB 12.6 02/18/2016   HCT 38.8 02/18/2016   MCV 88.4 02/18/2016   PLT 269 02/18/2016   NEUTROABS 2.9 09/27/2015    ASSESSMENT & PLAN:  Breast cancer of lower-inner quadrant of left female breast ([facility]) Left mastectomy 02/26/2016:  IDC grade 3, 2.5 cm, ER 35%, PR less than 1%,   HER-2 negative ratio 1.14, Ki-67 40%, T2 N0 stage II a;  ILC grade 1, 0.2 cm, ALH, marg neg, 0/1 lymph nodes, ER 95%, PR 90%, Ki-67 2%, HER-2 neg ratio 1.15, T1a N0 stage IA  Treatment plan: Adjuvant antiestrogen therapy with Anastrozole 1 mg daily 5 years (started neoadjuvantly 01/29/2016)  Anastrozole toxicities: Patient did not have any hot flashes or myalgias. on Fosamax and calcium and vitamin D.  She has dementia.  Surveillance: We will set her up for mammograms to be done September 2018 at [facility]. Return to clinic in 1 year for follow-up   I spent 25 minutes talking to the patient of which more than half was spent in counseling and coordination of care.  No orders of the defined types were placed in this encounter.  The patient has a good understanding of the overall plan. she agrees with it. she will call with any problems that may develop before the next visit here.   Rulon Eisenmenger, MD 09/08/16

## 2016-09-11 ENCOUNTER — Other Ambulatory Visit: Payer: Self-pay | Admitting: Emergency Medicine

## 2016-09-11 MED ORDER — ANASTROZOLE 1 MG PO TABS: 1.0000 mg | ORAL_TABLET | Freq: Every day | ORAL | 3 refills | Status: AC

## 2016-09-15 ENCOUNTER — Encounter: Payer: Self-pay | Admitting: Diagnostic Neuroimaging

## 2016-09-15 ENCOUNTER — Ambulatory Visit (INDEPENDENT_AMBULATORY_CARE_PROVIDER_SITE_OTHER): Payer: Medicare Other | Admitting: Diagnostic Neuroimaging

## 2016-09-15 VITALS — BP 129/74 | HR 97 | Ht 60.0 in | Wt 135.4 lb

## 2016-09-15 DIAGNOSIS — F0390 Unspecified dementia without behavioral disturbance: Secondary | ICD-10-CM

## 2016-09-15 DIAGNOSIS — F03B0 Unspecified dementia, moderate, without behavioral disturbance, psychotic disturbance, mood disturbance, and anxiety: Secondary | ICD-10-CM

## 2016-09-15 NOTE — Patient Instructions (Signed)
-   continue memantine   - use rollator walker  - follow up as needed
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
## 2016-09-15 NOTE — Addendum Note (Signed)
Addended byOliver Hum on: 09/15/2016 12:11 PM   Modules accepted: Orders

## 2016-10-03 ENCOUNTER — Encounter: Payer: Medicare Other | Admitting: Adult Health

## 2016-10-07 ENCOUNTER — Ambulatory Visit: Payer: Medicare Other | Admitting: Emergency Medicine

## 2016-11-05 ENCOUNTER — Encounter: Payer: Medicare Other | Admitting: Adult Health

## 2016-11-05 NOTE — Progress Notes (Deleted)
CLINIC:  Survivorship   REASON FOR VISIT:  Routine follow-up post-treatment for a recent history of breast cancer.  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 ([place])   01/29/1999 Initial Diagnosis    Left breast cancer treated with lumpectomy, radiation, systemic chemotherapy for 6 months, unknown stage and unknown receptor status      12/13/2015 Mammogram    Left mammogram and ultrasound: 1.6 cm oval lesion Microlobated posterior depth, measured 1.7 cm by ultrasound, T1 CN 0 stage IA clinical stage      12/27/2015 Initial Biopsy    Left breast biopsy: IDC grade 3, ER 30%, PR less than 1%, HER-2 negative, Ki-67 40%      01/29/2016 -  Anti-estrogen oral therapy    Anastrozole 1 mg daily      02/26/2016 Surgery    Left mastectomy: IDC grade 3, 2.5 cm, ER 35%, PR less than 1%, HER-2 negative ratio 1.14, Ki-67 40%, T2 N0 stage II a; ILC grade 1, 0.2 cm, ALH, margins negative, 0/1 lymph nodes, ER 95%, PR 90%, Ki-67 2%, HER-2 negative ratio 1.15, T1 1 N0 stage IA       INTERVAL HISTORY:  [name] presents to the [facility] today for our initial meeting to review her survivorship care plan detailing her treatment course for breast cancer, as well as monitoring long-term side effects of that treatment, education regarding health maintenance, screening, and overall wellness and health promotion.     Overall, [name]      REVIEW OF SYSTEMS:  Review of Systems - Oncology Breast: Denies any new nodularity, masses, tenderness, nipple changes, or nipple discharge.     ONCOLOGY TREATMENT TEAM:  1. Surgeon:  Dr. Ninfa Linden at [facility] 2. Medical Oncologist: Dr. Lindi Adie  PAST MEDICAL/SURGICAL HISTORY:  Past Medical History:  Diagnosis Date  . Anxiety   . Arthritis   . Cancer [facility])    breast CA 2002  . CHF (congestive heart failure) ([place])   . COPD (chronic obstructive pulmonary disease) ([place])   . Dementia     . Hypercholesterolemia   . Hyperthyroidism   . Osteopenia    Past Surgical History:  Procedure Laterality Date  . ABDOMINAL HYSTERECTOMY    . APPENDECTOMY    . BACK SURGERY    . BREAST SURGERY    . MASTECTOMY W/ SENTINEL NODE BIOPSY Left 02/26/2016   Procedure: LEFT MASTECTOMY WITH SENTINEL LYMPH NODE BIOPSY;  Surgeon: Coralie Keens, MD;  Location: [facility];  Service: General;  Laterality: Left;     ALLERGIES:  No Known Allergies   CURRENT MEDICATIONS:  Outpatient Encounter Prescriptions as of 11/05/2016  Medication Sig Note  . albuterol (PROVENTIL HFA;VENTOLIN HFA) 108 (90 Base) MCG/ACT inhaler Inhale 2 puffs into the lungs every 4 (four) hours as needed for wheezing or shortness of breath. (Patient not taking: Reported on 09/15/2016)   . alendronate (FOSAMAX) 70 MG tablet 70 mg every 7 (seven) days. 03/17/16 has not started yet 03/17/2016: Received from: External Pharmacy  . anastrozole (ARIMIDEX) 1 MG tablet Take 1 tablet (1 mg total) by mouth daily.   Marland Kitchen aspirin 81 MG chewable tablet Chew 81 mg by mouth daily.   . Calcium Citrate-Vitamin D (CALCITRATE/VITAMIN D PO) Take by mouth. 800/1200mg daily   . carvedilol (COREG) 6.25 MG tablet Take 1 tablet (6.25 mg total) by mouth 2 (two) times daily with a meal.   . furosemide (LASIX) 20 MG tablet Take  20 mg by mouth daily.  02/04/2016: Received from: External Pharmacy  . gabapentin (NEURONTIN) 400 MG capsule Take 400 mg by mouth 3 (three) times daily.   Marland Kitchen levothyroxine (SYNTHROID, LEVOTHROID) 25 MCG tablet Take 25 mcg by mouth daily before breakfast.    . lisinopril (PRINIVIL,ZESTRIL) 2.5 MG tablet Take 1 tablet (2.5 mg total) by mouth daily.   . memantine (NAMENDA) 10 MG tablet Take 1 tablet (10 mg total) by mouth 2 (two) times daily.   . Multiple Vitamin (MULTIVITAMIN WITH MINERALS) TABS tablet Take 2 tablets by mouth daily.    Marland Kitchen oxyCODONE (OXY IR/ROXICODONE) 5 MG immediate release tablet Take 1 tablet (5 mg total) by mouth every 4  (four) hours as needed for moderate pain. (Patient not taking: Reported on 09/15/2016)   . potassium chloride SA (K-DUR,KLOR-CON) 20 MEQ tablet Take 20 mEq by mouth daily.    . Tiotropium Bromide Monohydrate (SPIRIVA RESPIMAT) 2.5 MCG/ACT AERS Inhale 2 puffs into the lungs daily.   Marland Kitchen venlafaxine XR (EFFEXOR-XR) 150 MG 24 hr capsule Take 150 mg by mouth daily with breakfast.    No facility-administered encounter medications on file as of 11/05/2016.      ONCOLOGIC FAMILY HISTORY:  Family History  Problem Relation Age of Onset  . Stroke Father   . Diabetes Mellitus I Son   . Leukemia Son      GENETIC COUNSELING/TESTING: ***  SOCIAL HISTORY:  [name] is /single/married/divorced/widowed/separated and lives alone/with her spouse/family/friend in (city), [place].  She has (#) children and they live in (city).  [name] is currently retired/disabled/working part-time/full-time as ***.  She denies any current or history of tobacco, alcohol, or illicit drug use.     PHYSICAL EXAMINATION:  Vital Signs:  There were no vitals filed for this visit. There were no vitals filed for this visit. General: Well-nourished, well-appearing female in no acute distress.  She is unaccompanied/accompanied in clinic by her ***** today.   HEENT: Head is normocephalic.  Pupils equal and reactive to light. Conjunctivae clear without exudate.  Sclerae anicteric. Oral mucosa is pink, moist.  Oropharynx is pink without lesions or erythema.  Lymph: No cervical, supraclavicular, or infraclavicular lymphadenopathy noted on palpation.  Cardiovascular: Regular rate and rhythm.Marland Kitchen Respiratory: Clear to auscultation bilaterally. Chest expansion symmetric; breathing non-labored.  GI: Abdomen soft and round; non-tender, non-distended. Bowel sounds normoactive.  GU: Deferred.  Neuro: No focal deficits. Steady gait.  Psych: Mood and affect normal and appropriate for situation.  Extremities: No edema. Skin:  Warm and dry.  LABORATORY DATA:  None for this visit.  DIAGNOSTIC IMAGING:  None for this visit.      ASSESSMENT AND PLAN:  Ms.. Vaughn is a pleasant 76 y.o. female with Stage IA left breast invasive ductal carcinoma, ER+/PR-/HER2-, diagnosed in 11/2015, treated with mastectomy and anti estrogen therapy with Anastrozole starting in 01/2016.  She has h/o left breast cancer from the year 2000 as well treated with lumpectomy, radiation, and chemotherapy.  The stage of her breast cancer from 2000 and the receptor status is unknown.  She presents to the Survivorship Clinic for our initial meeting and routine follow-up post-completion of treatment for breast cancer.    1. Stage IA left breast cancer:  [name] is continuing to recover from definitive treatment for breast cancer. She will follow-up with her medical oncologist, Dr. Lindi Adie in *** with history and physical exam per surveillance protocol.  She will continue her anti-estrogen therapy with Anastrozole. Thus far, she is  tolerating the Anastrozole well, with minimal side effects. She was instructed to make Dr. Lindi Adie or myself aware if she begins to experience any worsening side effects of the medication and I could see her back in clinic to help manage those side effects, as needed.  Today, a comprehensive survivorship care plan and treatment summary was reviewed with the patient today detailing her breast cancer diagnosis, treatment course, potential late/long-term effects of treatment, appropriate follow-up care with recommendations for the future, and patient education resources.  A copy of this summary, along with a letter will be sent to the patient's primary care provider via mail/fax/In Basket message after today's visit.    #. Problem(s) at Visit______________  #. Bone health:  Given [name] age/history of breast cancer and her current treatment regimen including anti-estrogen therapy with Anastrozole, she is at risk for bone  demineralization.  Her last DEXA scan was **/**/20**, which showed (results).***  In the meantime, she was encouraged to increase her consumption of foods rich in calcium, as well as increase her weight-bearing activities.  She was given education on specific activities to promote bone health.  #. Cancer screening:  Due to [name] history and her age, she should receive screening for skin cancers, colon cancer, and gynecologic cancers.  The information and recommendations are listed on the patient's comprehensive care plan/treatment summary and were reviewed in detail with the patient.    #. Health maintenance and wellness promotion: [name] was encouraged to consume 5-7 servings of fruits and vegetables per day. We reviewed the "Nutrition Rainbow" handout, as well as the handout "Take Control of Your Health and Reduce Your Cancer Risk" from the [employer].  She was also encouraged to engage in moderate to vigorous exercise for 30 minutes per day most days of the week. We discussed the [employer] fitness program, which is designed for cancer survivors to help them become more physically fit after cancer treatments.  She was instructed to limit her alcohol consumption and continue to abstain from tobacco use/***was encouraged stop smoking.     #. Support services/counseling: It is not uncommon for this period of the patient's cancer care trajectory to be one of many emotions and stressors.  We discussed an opportunity for her to participate in the next session of [facility] ("Finding Your New Normal") support group series designed for patients after they have completed treatment.   [name] was encouraged to take advantage of our many other support services programs, support groups, and/or counseling in coping with her new life as a cancer survivor after completing anti-cancer treatment.  She was offered support today through active listening and expressive supportive counseling.  She  was given information regarding our available services and encouraged to contact me with any questions or for help enrolling in any of our support group/programs.    Dispo:   -Return to cancer center ***  -She is welcome to return back to the Survivorship Clinic at any time; no additional follow-up needed at this time.  -Consider referral back to survivorship as a long-term survivor for continued surveillance  A total of (30) minutes of face-to-face time was spent with this patient with greater than 50% of that time in counseling and care-coordination.   Gardenia Phlegm, [place] [phone]   Note: PRIMARY CARE PROVIDER [name], [place] [phone]

## 2016-12-05 ENCOUNTER — Ambulatory Visit: Payer: Medicare Other | Admitting: Emergency Medicine

## 2017-09-08 ENCOUNTER — Ambulatory Visit: Payer: Medicare Other | Admitting: Hematology and Oncology

## 2017-09-08 NOTE — Assessment & Plan Note (Deleted)
Left mastectomy 02/26/2016:  IDCgrade 3, 2.5 cm, ER 35%, PR less than 1%, HER-2 negative ratio 1.14, Ki-67 40%, T2 N0 stage II a;  ILCgrade 1, 0.2 cm, ALH, marg neg, 0/1 lymph nodes, ER 95%, PR 90%, Ki-67 2%, HER-2 neg ratio 1.15, T1aN0 stage IA  Treatment plan: Adjuvant antiestrogen therapy with Anastrozole 1 mg daily 5 years (started neoadjuvantly 01/29/2016)  Anastrozole toxicities: Tolerating it very well Osteoporosis: On Fosamax and calcium and vitamin D.  She also has dementia.  Follows with her PCP  Surveillance:  mammograms to be done September 2018 at [facility]. Breast exam 09/08/2017: Benign Return to clinic in 1 year for follow-up   

## 2017-09-08 NOTE — Progress Notes (Deleted)
Patient Care Team: [name], MD as PCP - General (Family Medicine) Nicholas Lose, MD as Consulting Physician (Hematology and Oncology) Delice Bison Charlestine Massed, NP as Nurse Practitioner (Hematology and Oncology) Coralie Keens, MD as Consulting Physician (General Surgery)  DIAGNOSIS:  Encounter Diagnosis  Name Primary?  . 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 ([place])   01/29/1999 Initial Diagnosis    Left breast cancer treated with lumpectomy, radiation, systemic chemotherapy for 6 months, unknown stage and unknown receptor status      12/13/2015 Mammogram    Left mammogram and ultrasound: 1.6 cm oval lesion Microlobated posterior depth, measured 1.7 cm by ultrasound, T1 CN 0 stage IA clinical stage      12/27/2015 Initial Biopsy    Left breast biopsy: IDC grade 3, ER 30%, PR less than 1%, HER-2 negative, Ki-67 40%      01/29/2016 -  Anti-estrogen oral therapy    Anastrozole 1 mg daily      02/26/2016 Surgery    Left mastectomy: IDC grade 3, 2.5 cm, ER 35%, PR less than 1%, HER-2 negative ratio 1.14, Ki-67 40%, T2 N0 stage II a; ILC grade 1, 0.2 cm, ALH, margins negative, 0/1 lymph nodes, ER 95%, PR 90%, Ki-67 2%, HER-2 negative ratio 1.15, T1 1 N0 stage IA       CHIEF COMPLIANT: Follow-up on anastrozole therapy  INTERVAL HISTORY: [name] is a 53-year with above-mentioned history left breast cancer treated with mastectomy and is currently on anastrozole therapy.  She appears to be tolerating anastrozole fairly well.  She does not have any hot flashes or myalgias.  For the osteoporosis she is currently on Fosamax along with calcium and vitamin D.  She denies any lumps or nodules in the breast.  REVIEW OF SYSTEMS:   Constitutional: Denies fevers, chills or abnormal weight loss Eyes: Denies blurriness of  vision Ears, nose, mouth, throat, and face: Denies mucositis or sore throat Respiratory: Denies cough, dyspnea or wheezes Cardiovascular: Denies palpitation, chest discomfort Gastrointestinal:  Denies nausea, heartburn or change in bowel habits Skin: Denies abnormal skin rashes Lymphatics: Denies new lymphadenopathy or easy bruising Neurological:Denies numbness, tingling or new weaknesses Behavioral/Psych: Mood is stable, no new changes  Extremities: No lower extremity edema Breast:  denies any pain or lumps or nodules in either breasts All other systems were reviewed with the patient and are negative.  I have reviewed the past medical history, past surgical history, social history and family history with the patient and they are unchanged from previous note.  ALLERGIES:  has No Known Allergies.  MEDICATIONS:  Current Outpatient Medications  Medication Sig Dispense Refill  . albuterol (PROVENTIL HFA;VENTOLIN HFA) 108 (90 Base) MCG/ACT inhaler Inhale 2 puffs into the lungs every 4 (four) hours as needed for wheezing or shortness of breath. (Patient not taking: Reported on 09/15/2016) 1 Inhaler 5  . alendronate (FOSAMAX) 70 MG tablet 70 mg every 7 (seven) days. 03/17/16 has not started yet    . anastrozole (ARIMIDEX) 1 MG tablet Take 1 tablet (1 mg total) by mouth daily. 90 tablet 3  . aspirin 81 MG chewable tablet Chew 81 mg by mouth daily.    . Calcium Citrate-Vitamin D (CALCITRATE/VITAMIN D PO) Take by mouth. 800/1200mg daily    . carvedilol (COREG) 6.25 MG tablet Take 1 tablet (6.25 mg total) by mouth 2 (  two) times daily with a meal. 60 tablet 0  . furosemide (LASIX) 20 MG tablet Take 20 mg by mouth daily.     Marland Kitchen gabapentin (NEURONTIN) 400 MG capsule Take 400 mg by mouth 3 (three) times daily.    Marland Kitchen levothyroxine (SYNTHROID, LEVOTHROID) 25 MCG tablet Take 25 mcg by mouth daily before breakfast.     . lisinopril (PRINIVIL,ZESTRIL) 2.5 MG tablet Take 1 tablet (2.5 mg total) by mouth daily.  30 tablet 0  . memantine (NAMENDA) 10 MG tablet Take 1 tablet (10 mg total) by mouth 2 (two) times daily. 60 tablet 12  . Multiple Vitamin (MULTIVITAMIN WITH MINERALS) TABS tablet Take 2 tablets by mouth daily.     Marland Kitchen oxyCODONE (OXY IR/ROXICODONE) 5 MG immediate release tablet Take 1 tablet (5 mg total) by mouth every 4 (four) hours as needed for moderate pain. (Patient not taking: Reported on 09/15/2016) 30 tablet 0  . potassium chloride SA (K-DUR,KLOR-CON) 20 MEQ tablet Take 20 mEq by mouth daily.     . Tiotropium Bromide Monohydrate (SPIRIVA RESPIMAT) 2.5 MCG/ACT AERS Inhale 2 puffs into the lungs daily. 1 Inhaler 5  . venlafaxine XR (EFFEXOR-XR) 150 MG 24 hr capsule Take 150 mg by mouth daily with breakfast.     No current facility-administered medications for this visit.     PHYSICAL EXAMINATION: ECOG PERFORMANCE STATUS: 1 - Symptomatic but completely ambulatory  There were no vitals filed for this visit. There were no vitals filed for this visit.  GENERAL:alert, no distress and comfortable SKIN: skin color, texture, turgor are normal, no rashes or significant lesions EYES: normal, Conjunctiva are pink and non-injected, sclera clear OROPHARYNX:no exudate, no erythema and lips, buccal mucosa, and tongue normal  NECK: supple, thyroid normal size, non-tender, without nodularity LYMPH:  no palpable lymphadenopathy in the cervical, axillary or inguinal LUNGS: clear to auscultation and percussion with normal breathing effort HEART: regular rate & rhythm and no murmurs and no lower extremity edema ABDOMEN:abdomen soft, non-tender and normal bowel sounds MUSCULOSKELETAL:no cyanosis of digits and no clubbing  NEURO: alert & oriented x 3 with fluent speech, no focal motor/sensory deficits, and has dementia EXTREMITIES: No lower extremity edema BREAST: No palpable masses or nodules in either right or left breasts. No palpable axillary supraclavicular or infraclavicular adenopathy no breast  tenderness or nipple discharge. (exam performed in the presence of a chaperone)  LABORATORY DATA:  I have reviewed the data as listed CMP Latest Ref Rng & Units 02/18/2016 09/27/2015 09/26/2015  Glucose 65 - 99 mg/dL 113(H) 106(H) 87  BUN 6 - 20 mg/dL '8 12 15  ' Creatinine 0.44 - 1.00 mg/dL 0.78 0.76 0.89  Sodium 135 - 145 mmol/L 140 138 139  Potassium 3.5 - 5.1 mmol/L 3.8 3.6 3.3(L)  Chloride 101 - 111 mmol/L 101 104 97(L)  CO2 22 - 32 mmol/L '29 25 29  ' Calcium 8.9 - 10.3 mg/dL 9.6 8.3(L) 10.0  Total Protein 6.5 - 8.1 g/dL - 5.7(L) 8.0  Total Bilirubin 0.3 - 1.2 mg/dL - 0.6 0.7  Alkaline Phos 38 - 126 U/L - 74 108  AST 15 - 41 U/L - 21 27  ALT 14 - 54 U/L - 16 20    Lab Results  Component Value Date   WBC 9.3 02/18/2016   HGB 12.6 02/18/2016   HCT 38.8 02/18/2016   MCV 88.4 02/18/2016   PLT 269 02/18/2016   NEUTROABS 2.9 09/27/2015    ASSESSMENT & PLAN:  Breast cancer of lower-inner quadrant of  left female breast ([place]) Left mastectomy 02/26/2016:  IDCgrade 3, 2.5 cm, ER 35%, PR less than 1%, HER-2 negative ratio 1.14, Ki-67 40%, T2 N0 stage II a;  ILCgrade 1, 0.2 cm, ALH, marg neg, 0/1 lymph nodes, ER 95%, PR 90%, Ki-67 2%, HER-2 neg ratio 1.15, T1aN0 stage IA  Treatment plan: Adjuvant antiestrogen therapy with Anastrozole 1 mg daily 5 years (started neoadjuvantly 01/29/2016)  Anastrozole toxicities: Tolerating it very well Osteoporosis: On Fosamax and calcium and vitamin D.  She also has dementia.  Follows with her PCP  Surveillance:  mammograms to be done September 2018 at [facility]. Breast exam 09/08/2017: Benign Return to clinic in 1 year for follow-up      No orders of the defined types were placed in this encounter.  The patient has a good understanding of the overall plan. she agrees with it. she will call with any problems that may develop before the next visit here.   Harriette Ohara, MD 09/08/17

## 2019-05-31 DIAGNOSIS — F0150 Vascular dementia without behavioral disturbance: Secondary | ICD-10-CM | POA: Diagnosis not present

## 2019-05-31 DIAGNOSIS — G4701 Insomnia due to medical condition: Secondary | ICD-10-CM | POA: Diagnosis not present

## 2019-05-31 DIAGNOSIS — Z20828 Contact with and (suspected) exposure to other viral communicable diseases: Secondary | ICD-10-CM | POA: Diagnosis not present

## 2019-05-31 DIAGNOSIS — F330 Major depressive disorder, recurrent, mild: Secondary | ICD-10-CM | POA: Diagnosis not present

## 2019-06-01 DIAGNOSIS — B351 Tinea unguium: Secondary | ICD-10-CM | POA: Diagnosis not present

## 2019-06-01 DIAGNOSIS — E118 Type 2 diabetes mellitus with unspecified complications: Secondary | ICD-10-CM | POA: Diagnosis not present

## 2019-06-01 DIAGNOSIS — Q845 Enlarged and hypertrophic nails: Secondary | ICD-10-CM | POA: Diagnosis not present

## 2019-06-01 DIAGNOSIS — I739 Peripheral vascular disease, unspecified: Secondary | ICD-10-CM | POA: Diagnosis not present

## 2019-06-06 DIAGNOSIS — E118 Type 2 diabetes mellitus with unspecified complications: Secondary | ICD-10-CM | POA: Diagnosis not present

## 2019-06-06 DIAGNOSIS — G9009 Other idiopathic peripheral autonomic neuropathy: Secondary | ICD-10-CM | POA: Diagnosis not present

## 2019-06-07 DIAGNOSIS — Z20828 Contact with and (suspected) exposure to other viral communicable diseases: Secondary | ICD-10-CM | POA: Diagnosis not present

## 2019-06-09 DIAGNOSIS — F330 Major depressive disorder, recurrent, mild: Secondary | ICD-10-CM | POA: Diagnosis not present

## 2019-06-13 DIAGNOSIS — K5904 Chronic idiopathic constipation: Secondary | ICD-10-CM | POA: Diagnosis not present

## 2019-06-13 DIAGNOSIS — K219 Gastro-esophageal reflux disease without esophagitis: Secondary | ICD-10-CM | POA: Diagnosis not present

## 2019-06-13 DIAGNOSIS — E782 Mixed hyperlipidemia: Secondary | ICD-10-CM | POA: Diagnosis not present

## 2019-06-13 DIAGNOSIS — I10 Essential (primary) hypertension: Secondary | ICD-10-CM | POA: Diagnosis not present

## 2019-06-14 DIAGNOSIS — Z20828 Contact with and (suspected) exposure to other viral communicable diseases: Secondary | ICD-10-CM | POA: Diagnosis not present

## 2019-06-14 DIAGNOSIS — R293 Abnormal posture: Secondary | ICD-10-CM | POA: Diagnosis not present

## 2019-06-14 DIAGNOSIS — M6281 Muscle weakness (generalized): Secondary | ICD-10-CM | POA: Diagnosis not present

## 2019-06-14 DIAGNOSIS — R278 Other lack of coordination: Secondary | ICD-10-CM | POA: Diagnosis not present

## 2019-06-16 DIAGNOSIS — M6281 Muscle weakness (generalized): Secondary | ICD-10-CM | POA: Diagnosis not present

## 2019-06-16 DIAGNOSIS — R293 Abnormal posture: Secondary | ICD-10-CM | POA: Diagnosis not present

## 2019-06-16 DIAGNOSIS — R278 Other lack of coordination: Secondary | ICD-10-CM | POA: Diagnosis not present

## 2019-06-20 DIAGNOSIS — M6281 Muscle weakness (generalized): Secondary | ICD-10-CM | POA: Diagnosis not present

## 2019-06-20 DIAGNOSIS — R293 Abnormal posture: Secondary | ICD-10-CM | POA: Diagnosis not present

## 2019-06-20 DIAGNOSIS — R278 Other lack of coordination: Secondary | ICD-10-CM | POA: Diagnosis not present

## 2019-06-21 DIAGNOSIS — F330 Major depressive disorder, recurrent, mild: Secondary | ICD-10-CM | POA: Diagnosis not present

## 2019-06-21 DIAGNOSIS — R293 Abnormal posture: Secondary | ICD-10-CM | POA: Diagnosis not present

## 2019-06-21 DIAGNOSIS — M6281 Muscle weakness (generalized): Secondary | ICD-10-CM | POA: Diagnosis not present

## 2019-06-21 DIAGNOSIS — Z20828 Contact with and (suspected) exposure to other viral communicable diseases: Secondary | ICD-10-CM | POA: Diagnosis not present

## 2019-06-21 DIAGNOSIS — R278 Other lack of coordination: Secondary | ICD-10-CM | POA: Diagnosis not present

## 2019-06-23 DIAGNOSIS — R278 Other lack of coordination: Secondary | ICD-10-CM | POA: Diagnosis not present

## 2019-06-23 DIAGNOSIS — R293 Abnormal posture: Secondary | ICD-10-CM | POA: Diagnosis not present

## 2019-06-23 DIAGNOSIS — M6281 Muscle weakness (generalized): Secondary | ICD-10-CM | POA: Diagnosis not present

## 2019-06-24 DIAGNOSIS — F330 Major depressive disorder, recurrent, mild: Secondary | ICD-10-CM | POA: Diagnosis not present

## 2019-06-24 DIAGNOSIS — F0150 Vascular dementia without behavioral disturbance: Secondary | ICD-10-CM | POA: Diagnosis not present

## 2019-06-27 DIAGNOSIS — G9009 Other idiopathic peripheral autonomic neuropathy: Secondary | ICD-10-CM | POA: Diagnosis not present

## 2019-06-27 DIAGNOSIS — M6281 Muscle weakness (generalized): Secondary | ICD-10-CM | POA: Diagnosis not present

## 2019-06-27 DIAGNOSIS — R278 Other lack of coordination: Secondary | ICD-10-CM | POA: Diagnosis not present

## 2019-06-27 DIAGNOSIS — R293 Abnormal posture: Secondary | ICD-10-CM | POA: Diagnosis not present

## 2019-06-28 DIAGNOSIS — F0150 Vascular dementia without behavioral disturbance: Secondary | ICD-10-CM | POA: Diagnosis not present

## 2019-06-28 DIAGNOSIS — F330 Major depressive disorder, recurrent, mild: Secondary | ICD-10-CM | POA: Diagnosis not present

## 2019-06-28 DIAGNOSIS — Z20828 Contact with and (suspected) exposure to other viral communicable diseases: Secondary | ICD-10-CM | POA: Diagnosis not present

## 2019-06-29 DIAGNOSIS — M6281 Muscle weakness (generalized): Secondary | ICD-10-CM | POA: Diagnosis not present

## 2019-06-29 DIAGNOSIS — R278 Other lack of coordination: Secondary | ICD-10-CM | POA: Diagnosis not present

## 2019-06-29 DIAGNOSIS — R293 Abnormal posture: Secondary | ICD-10-CM | POA: Diagnosis not present

## 2019-06-30 DIAGNOSIS — R278 Other lack of coordination: Secondary | ICD-10-CM | POA: Diagnosis not present

## 2019-06-30 DIAGNOSIS — M6281 Muscle weakness (generalized): Secondary | ICD-10-CM | POA: Diagnosis not present

## 2019-06-30 DIAGNOSIS — R293 Abnormal posture: Secondary | ICD-10-CM | POA: Diagnosis not present

## 2019-07-04 DIAGNOSIS — I5042 Chronic combined systolic (congestive) and diastolic (congestive) heart failure: Secondary | ICD-10-CM | POA: Diagnosis not present

## 2019-07-04 DIAGNOSIS — R293 Abnormal posture: Secondary | ICD-10-CM | POA: Diagnosis not present

## 2019-07-04 DIAGNOSIS — E039 Hypothyroidism, unspecified: Secondary | ICD-10-CM | POA: Diagnosis not present

## 2019-07-04 DIAGNOSIS — J449 Chronic obstructive pulmonary disease, unspecified: Secondary | ICD-10-CM | POA: Diagnosis not present

## 2019-07-04 DIAGNOSIS — R278 Other lack of coordination: Secondary | ICD-10-CM | POA: Diagnosis not present

## 2019-07-04 DIAGNOSIS — M6281 Muscle weakness (generalized): Secondary | ICD-10-CM | POA: Diagnosis not present

## 2019-07-05 DIAGNOSIS — R278 Other lack of coordination: Secondary | ICD-10-CM | POA: Diagnosis not present

## 2019-07-05 DIAGNOSIS — R293 Abnormal posture: Secondary | ICD-10-CM | POA: Diagnosis not present

## 2019-07-05 DIAGNOSIS — M6281 Muscle weakness (generalized): Secondary | ICD-10-CM | POA: Diagnosis not present

## 2019-07-07 DIAGNOSIS — F330 Major depressive disorder, recurrent, mild: Secondary | ICD-10-CM | POA: Diagnosis not present

## 2019-07-07 DIAGNOSIS — F0150 Vascular dementia without behavioral disturbance: Secondary | ICD-10-CM | POA: Diagnosis not present

## 2019-07-08 DIAGNOSIS — R293 Abnormal posture: Secondary | ICD-10-CM | POA: Diagnosis not present

## 2019-07-08 DIAGNOSIS — M6281 Muscle weakness (generalized): Secondary | ICD-10-CM | POA: Diagnosis not present

## 2019-07-08 DIAGNOSIS — R278 Other lack of coordination: Secondary | ICD-10-CM | POA: Diagnosis not present

## 2019-07-11 DIAGNOSIS — R278 Other lack of coordination: Secondary | ICD-10-CM | POA: Diagnosis not present

## 2019-07-11 DIAGNOSIS — M6281 Muscle weakness (generalized): Secondary | ICD-10-CM | POA: Diagnosis not present

## 2019-07-11 DIAGNOSIS — R293 Abnormal posture: Secondary | ICD-10-CM | POA: Diagnosis not present

## 2019-07-19 DIAGNOSIS — F330 Major depressive disorder, recurrent, mild: Secondary | ICD-10-CM | POA: Diagnosis not present

## 2019-07-25 DIAGNOSIS — G9009 Other idiopathic peripheral autonomic neuropathy: Secondary | ICD-10-CM | POA: Diagnosis not present

## 2019-07-26 DIAGNOSIS — F0150 Vascular dementia without behavioral disturbance: Secondary | ICD-10-CM | POA: Diagnosis not present

## 2019-07-26 DIAGNOSIS — F330 Major depressive disorder, recurrent, mild: Secondary | ICD-10-CM | POA: Diagnosis not present

## 2019-08-01 DIAGNOSIS — K219 Gastro-esophageal reflux disease without esophagitis: Secondary | ICD-10-CM | POA: Diagnosis not present

## 2019-08-01 DIAGNOSIS — E559 Vitamin D deficiency, unspecified: Secondary | ICD-10-CM | POA: Diagnosis not present

## 2019-08-01 DIAGNOSIS — E782 Mixed hyperlipidemia: Secondary | ICD-10-CM | POA: Diagnosis not present

## 2019-08-01 DIAGNOSIS — I10 Essential (primary) hypertension: Secondary | ICD-10-CM | POA: Diagnosis not present

## 2019-08-02 DIAGNOSIS — F330 Major depressive disorder, recurrent, mild: Secondary | ICD-10-CM | POA: Diagnosis not present

## 2019-08-16 DIAGNOSIS — F330 Major depressive disorder, recurrent, mild: Secondary | ICD-10-CM | POA: Diagnosis not present

## 2019-12-05 ENCOUNTER — Telehealth: Payer: Self-pay

## 2019-12-05 NOTE — Telephone Encounter (Signed)
referral on file from [facility] [phone] sent referral to scheduling

## 2019-12-06 ENCOUNTER — Telehealth: Payer: Self-pay | Admitting: *Deleted

## 2019-12-06 NOTE — Telephone Encounter (Signed)
Received call from Helene Kelp, the referral coordinator with [facility] requesting f/u apt with Dr. Lindi Adie. States pt is on anastrozole and has not been seen in the office since 2018.  States pt has poor memory and is needing a f/u for evaluation of needing to continue antiestrogen therapy or stop it.  Apt schedule and Helene Kelp stated she will arrange for transportation for pt.

## 2019-12-14 ENCOUNTER — Telehealth: Payer: Self-pay | Admitting: *Deleted

## 2019-12-14 NOTE — Telephone Encounter (Signed)
Pt daughter Santiago Glad called and left message requesting f/u with Dr.Gudena after recent mammogram. Returned daughter call and left vm to call so that pt could be scheduled. Awaiting callback from daughter

## 2019-12-15 ENCOUNTER — Ambulatory Visit: Payer: Medicare Other | Admitting: Hematology and Oncology

## 2020-01-24 ENCOUNTER — Encounter: Payer: Self-pay | Admitting: Hematology and Oncology

## 2020-02-01 NOTE — Progress Notes (Signed)
Patient Care Team: [name], MD as PCP - General (Family Medicine) Nicholas Lose, MD as Consulting Physician (Hematology and Oncology) Delice Bison, Charlestine Massed, NP as Nurse Practitioner (Hematology and Oncology) Coralie Keens, MD as Consulting Physician (General Surgery)  DIAGNOSIS:    ICD-10-CM   1. Malignant neoplasm of lower-inner quadrant of left breast in female, estrogen receptor positive ([place])  C50.312    Z17.0     SUMMARY OF ONCOLOGIC HISTORY: Oncology History  Breast cancer of lower-inner quadrant of left female breast ([facility])  01/29/1999 Initial Diagnosis   Left breast cancer treated with lumpectomy, radiation, systemic chemotherapy for 6 months, unknown stage and unknown receptor status   12/13/2015 Mammogram   Left mammogram and ultrasound: 1.6 cm oval lesion Microlobated posterior depth, measured 1.7 cm by ultrasound, T1 CN 0 stage IA clinical stage   12/27/2015 Initial Biopsy   Left breast biopsy: IDC grade 3, ER 30%, PR less than 1%, HER-2 negative, Ki-67 40%   01/29/2016 -  Anti-estrogen oral therapy   Anastrozole 1 mg daily   02/26/2016 Surgery   Left mastectomy: IDC grade 3, 2.5 cm, ER 35%, PR less than 1%, HER-2 negative ratio 1.14, Ki-67 40%, T2 N0 stage II a; ILC grade 1, 0.2 cm, ALH, margins negative, 0/1 lymph nodes, ER 95%, PR 90%, Ki-67 2%, HER-2 negative ratio 1.15, T1 1 N0 stage IA     CHIEF COMPLIANT:  Follow-up of left breast cancer on anastrozole  INTERVAL HISTORY: [name] is a 79 y.o. with above-mentioned history of left breast cancer treated with mastectomy and is currently on oral antiestrogen therapy with anastrozole. I last saw her 3 years ago. She presents to the clinic today for follow-up.  She is tolerating anastrozole extremely well without any problems or concerns.  Denies any lumps or nodules in the breast.  Mammograms done earlier in the year was normal.  ALLERGIES:  has No Known Allergies.  MEDICATIONS:  Current Outpatient  Medications  Medication Sig Dispense Refill  . albuterol (PROVENTIL HFA;VENTOLIN HFA) 108 (90 Base) MCG/ACT inhaler Inhale 2 puffs into the lungs every 4 (four) hours as needed for wheezing or shortness of breath. (Patient not taking: Reported on 09/15/2016) 1 Inhaler 5  . alendronate (FOSAMAX) 70 MG tablet 70 mg every 7 (seven) days. 03/17/16 has not started yet    . anastrozole (ARIMIDEX) 1 MG tablet Take 1 tablet (1 mg total) by mouth daily. 90 tablet 3  . aspirin 81 MG chewable tablet Chew 81 mg by mouth daily.    . Calcium Citrate-Vitamin D (CALCITRATE/VITAMIN D PO) Take by mouth. 800/1200mg daily    . carvedilol (COREG) 6.25 MG tablet Take 1 tablet (6.25 mg total) by mouth 2 (two) times daily with a meal. 60 tablet 0  . furosemide (LASIX) 20 MG tablet Take 20 mg by mouth daily.     Marland Kitchen gabapentin (NEURONTIN) 400 MG capsule Take 400 mg by mouth 3 (three) times daily.    Marland Kitchen levothyroxine (SYNTHROID, LEVOTHROID) 25 MCG tablet Take 25 mcg by mouth daily before breakfast.     . lisinopril (PRINIVIL,ZESTRIL) 2.5 MG tablet Take 1 tablet (2.5 mg total) by mouth daily. 30 tablet 0  . memantine (NAMENDA) 10 MG tablet Take 1 tablet (10 mg total) by mouth 2 (two) times daily. 60 tablet 12  . Multiple Vitamin (MULTIVITAMIN WITH MINERALS) TABS tablet Take 2 tablets by mouth daily.     Marland Kitchen oxyCODONE (OXY IR/ROXICODONE) 5 MG immediate release tablet Take 1 tablet (5  mg total) by mouth every 4 (four) hours as needed for moderate pain. (Patient not taking: Reported on 09/15/2016) 30 tablet 0  . potassium chloride SA (K-DUR,KLOR-CON) 20 MEQ tablet Take 20 mEq by mouth daily.     . Tiotropium Bromide Monohydrate (SPIRIVA RESPIMAT) 2.5 MCG/ACT AERS Inhale 2 puffs into the lungs daily. 1 Inhaler 5  . venlafaxine XR (EFFEXOR-XR) 150 MG 24 hr capsule Take 150 mg by mouth daily with breakfast.     No current facility-administered medications for this visit.    PHYSICAL EXAMINATION: ECOG PERFORMANCE STATUS: 1 -  Symptomatic but completely ambulatory  Vitals:   02/02/20 1104  BP: (!) 145/78  Pulse: 93  Resp: 17  Temp: 98.1 F (36.7 C)  SpO2: 97%   Filed Weights   02/02/20 1104  Weight: 119 lb 6.4 oz (54.2 kg)    BREAST: No palpable lumps or nodules in the right breast.  Left chest wall and postmastectomy changes. (exam performed in the presence of a chaperone)  LABORATORY DATA:  I have reviewed the data as listed CMP Latest Ref Rng & Units 02/18/2016 09/27/2015 09/26/2015  Glucose 65 - 99 mg/dL 113(H) 106(H) 87  BUN 6 - 20 mg/dL '8 12 15  ' Creatinine 0.44 - 1.00 mg/dL 0.78 0.76 0.89  Sodium 135 - 145 mmol/L 140 138 139  Potassium 3.5 - 5.1 mmol/L 3.8 3.6 3.3(L)  Chloride 101 - 111 mmol/L 101 104 97(L)  CO2 22 - 32 mmol/L '29 25 29  ' Calcium 8.9 - 10.3 mg/dL 9.6 8.3(L) 10.0  Total Protein 6.5 - 8.1 g/dL - 5.7(L) 8.0  Total Bilirubin 0.3 - 1.2 mg/dL - 0.6 0.7  Alkaline Phos 38 - 126 U/L - 74 108  AST 15 - 41 U/L - 21 27  ALT 14 - 54 U/L - 16 20    Lab Results  Component Value Date   WBC 9.3 02/18/2016   HGB 12.6 02/18/2016   HCT 38.8 02/18/2016   MCV 88.4 02/18/2016   PLT 269 02/18/2016   NEUTROABS 2.9 09/27/2015    ASSESSMENT & PLAN:  Breast cancer of lower-inner quadrant of left female breast ([place]) Left mastectomy 02/26/2016:  IDCgrade 3, 2.5 cm, ER 35%, PR less than 1%, HER-2 negative ratio 1.14, Ki-67 40%, T2 N0 stage II a;  ILCgrade 1, 0.2 cm, ALH, marg neg, 0/1 lymph nodes, ER 95%, PR 90%, Ki-67 2%, HER-2 neg ratio 1.15, T1aN0 stage IA  Treatment plan: Adjuvant antiestrogen therapy with Anastrozole 1 mg daily 5 years (started neoadjuvantly 01/29/2016)  Anastrozole toxicities: Patient did not have any hot flashes or myalgias. on Fosamax and calcium and vitamin D.  She has dementia.  Surveillance: We will set her up for mammograms to be done September 2018 at [facility]. Return to clinic in 1 year for follow-up    No orders of the defined types were placed in this  encounter.  The patient has a good understanding of the overall plan. she agrees with it. she will call with any problems that may develop before the next visit here.  Total time spent: 20 mins including face to face time and time spent for planning, charting and coordination of care  Nicholas Lose, MD 02/02/2020  I, Cloyde Reams Dorshimer, am acting as scribe for Dr. Nicholas Lose.  I have reviewed the above documentation for accuracy and completeness, and I agree with the above.

## 2020-02-02 ENCOUNTER — Other Ambulatory Visit: Payer: Self-pay

## 2020-02-02 ENCOUNTER — Inpatient Hospital Stay: Payer: Medicare Other | Attending: Hematology and Oncology | Admitting: Hematology and Oncology

## 2020-02-02 DIAGNOSIS — Z7982 Long term (current) use of aspirin: Secondary | ICD-10-CM | POA: Diagnosis not present

## 2020-02-02 DIAGNOSIS — Z79899 Other long term (current) drug therapy: Secondary | ICD-10-CM | POA: Insufficient documentation

## 2020-02-02 DIAGNOSIS — C50312 Malignant neoplasm of lower-inner quadrant of left female breast: Secondary | ICD-10-CM | POA: Diagnosis not present

## 2020-02-02 DIAGNOSIS — Z9012 Acquired absence of left breast and nipple: Secondary | ICD-10-CM | POA: Diagnosis not present

## 2020-02-02 DIAGNOSIS — Z170 Estrogen receptor positive status [ER+]: Secondary | ICD-10-CM | POA: Diagnosis not present

## 2020-02-02 DIAGNOSIS — F0390 Unspecified dementia without behavioral disturbance: Secondary | ICD-10-CM | POA: Diagnosis not present

## 2020-02-02 DIAGNOSIS — Z79811 Long term (current) use of aromatase inhibitors: Secondary | ICD-10-CM | POA: Diagnosis not present

## 2020-02-02 NOTE — Assessment & Plan Note (Signed)
Left mastectomy 02/26/2016:  IDCgrade 3, 2.5 cm, ER 35%, PR less than 1%, HER-2 negative ratio 1.14, Ki-67 40%, T2 N0 stage II a;  ILCgrade 1, 0.2 cm, ALH, marg neg, 0/1 lymph nodes, ER 95%, PR 90%, Ki-67 2%, HER-2 neg ratio 1.15, T1aN0 stage IA  Treatment plan: Adjuvant antiestrogen therapy with Anastrozole 1 mg daily 5 years (started neoadjuvantly 01/29/2016)  Anastrozole toxicities: Patient did not have any hot flashes or myalgias. on Fosamax and calcium and vitamin D.  She has dementia.  Surveillance: We will set her up for mammograms to be done September 2018 at [facility]. Return to clinic in 1 year for follow-up

## 2020-02-09 NOTE — Progress Notes (Signed)
CARDIOLOGY CONSULT NOTE       Patient ID: [name] MRN: [id number] DOB/AGE: December 09, 1940 79 y.o.  Admit date: (Not on file) Referring Physician: Orland Mustard Primary Physician: [name], MD Primary Cardiologist: New Reason for Consultation: CHF  Active Problems:   * No active hospital problems. *   HPI:  79 y.o. referred by DR Orland Mustard for CHF. History of dementia, hyperthyroidism, COPD, breast cancer, HLD and HTN I last saw her back in 2017  At that time she had worsening dementia and history of diastolic CHF Echo done 7/34/19 showed EF 55-60% mild focal basal hypertrophy no valve disease and normal RV function   She has seen Dr Lamonte Sakai for pulmonary previous smoker 30 pack years. She has been on nocturnal oxygen for year per old primary in [place] PFTls showed severe COPD with decreased DLCO and no bronchodilator response Also noted GERD And cough She is on lisinopril but this has not been changed to ARB  She sees Dr Lindi Adie for f/u of her breast cancer with left mastectomy and Anastrozole Rx  She lives in [facility]   Note from Dr Alfonse Spruce 10/17/19 indicated echo from April showed EF 40-45% with mild-moderate MR, AR, TR   Her grand daughter is with her We were both upset that the assisted living dropped patient off with no accompaniment  Patient has no cardiac symptoms or clinical symptoms of CHF   ROS All other systems reviewed and negative except as noted above  Past Medical History:  Diagnosis Date  . Anxiety   . Arthritis   . Cancer [facility])    breast CA 2002  . CHF (congestive heart failure) ([place])   . COPD (chronic obstructive pulmonary disease) ([place])   . Dementia   . Hypercholesterolemia   . Hyperthyroidism   . Osteopenia     Family History  Problem Relation Age of Onset  . Stroke Father   . Diabetes Mellitus I Son   . Leukemia Son     Social History   Socioeconomic History  . Marital status: Widowed    Spouse name: Not on file  . Number of  children: 2  . Years of education: 66  . Highest education level: Not on file  Occupational History  . Not on file  Tobacco Use  . Smoking status: Former Smoker    Quit date: 02/03/1994    Years since quitting: 26.0  . Smokeless tobacco: Never Used  Substance and Sexual Activity  . Alcohol use: Yes    Comment: 02/04/16 1 glass  wine daily  . Drug use: No  . Sexual activity: Not on file  Other Topics Concern  . Not on file  Social History Narrative   Lives at [facility] with 2 dogs   Caffeine - coffee, 1 cup daily   Social Determinants of Health   Financial Resource Strain:   . Difficulty of Paying Living Expenses: Not on file  Food Insecurity:   . Worried About Charity fundraiser in the Last Year: Not on file  . Ran Out of Food in the Last Year: Not on file  Transportation Needs:   . Lack of Transportation (Medical): Not on file  . Lack of Transportation (Non-Medical): Not on file  Physical Activity:   . Days of Exercise per Week: Not on file  . Minutes of Exercise per Session: Not on file  Stress:   . Feeling of Stress : Not on file  Social Connections:   . Frequency  of Communication with Friends and Family: Not on file  . Frequency of Social Gatherings with Friends and Family: Not on file  . Attends Religious Services: Not on file  . Active Member of Clubs or Organizations: Not on file  . Attends Archivist Meetings: Not on file  . Marital Status: Not on file  Intimate Partner Violence:   . Fear of Current or Ex-Partner: Not on file  . Emotionally Abused: Not on file  . Physically Abused: Not on file  . Sexually Abused: Not on file    Past Surgical History:  Procedure Laterality Date  . ABDOMINAL HYSTERECTOMY    . APPENDECTOMY    . BACK SURGERY    . BREAST SURGERY    . MASTECTOMY W/ SENTINEL NODE BIOPSY Left 02/26/2016   Procedure: LEFT MASTECTOMY WITH SENTINEL LYMPH NODE BIOPSY;  Surgeon: Coralie Keens, MD;  Location: [facility] [place];  Service: General;   Laterality: Left;      Current Outpatient Medications:  .  anastrozole (ARIMIDEX) 1 MG tablet, Take 1 tablet (1 mg total) by mouth daily., Disp: 90 tablet, Rfl: 3 .  aspirin 81 MG chewable tablet, Chew 81 mg by mouth daily., Disp: , Rfl:  .  Calcium Citrate-Vitamin D (CALCITRATE/VITAMIN D PO), Take by mouth. 800/1200mg  daily, Disp: , Rfl:  .  furosemide (LASIX) 20 MG tablet, Take 20 mg by mouth daily. , Disp: , Rfl:  .  gabapentin (NEURONTIN) 400 MG capsule, Take 400 mg by mouth 3 (three) times daily., Disp: , Rfl:  .  levothyroxine (SYNTHROID, LEVOTHROID) 25 MCG tablet, Take 25 mcg by mouth daily before breakfast. , Disp: , Rfl:  .  lisinopril (PRINIVIL,ZESTRIL) 2.5 MG tablet, Take 1 tablet (2.5 mg total) by mouth daily., Disp: 30 tablet, Rfl: 0 .  memantine (NAMENDA) 10 MG tablet, Take 1 tablet (10 mg total) by mouth 2 (two) times daily., Disp: 60 tablet, Rfl: 12 .  Multiple Vitamin (MULTIVITAMIN WITH MINERALS) TABS tablet, Take 2 tablets by mouth daily. , Disp: , Rfl:  .  potassium chloride SA (K-DUR,KLOR-CON) 20 MEQ tablet, Take 20 mEq by mouth daily. , Disp: , Rfl:  .  venlafaxine XR (EFFEXOR-XR) 150 MG 24 hr capsule, Take 150 mg by mouth daily with breakfast., Disp: , Rfl:     Physical Exam: Blood pressure 134/64, pulse 86, height 5' (1.524 m), weight 121 lb (54.9 kg), SpO2 96 %.   Affect appropriate Elderly demented female  HEENT: normal Neck supple with no adenopathy JVP normal no bruits no thyromegaly Lungs clear with no wheezing and good diaphragmatic motion Heart:  S1/S2 no murmur, no rub, gallop or click PMI normal Post left mastectomy  Abdomen: benighn, BS positve, no tenderness, no AAA no bruit.  No HSM or HJR Distal pulses intact with no bruits No edema Neuro non-focal Skin warm and dry No muscular weakness   Labs:   Lab Results  Component Value Date   WBC 9.3 02/18/2016   HGB 12.6 02/18/2016   HCT 38.8 02/18/2016   MCV 88.4 02/18/2016   PLT 269  02/18/2016   No results for input(s): NA, K, CL, CO2, BUN, CREATININE, CALCIUM, PROT, BILITOT, ALKPHOS, ALT, AST, GLUCOSE in the last 168 hours.  Invalid input(s): LABALBU Lab Results  Component Value Date   CKTOTAL 82 07/08/2013   TROPONINI <0.03 09/27/2015   No results found for: CHOL No results found for: HDL No results found for: LDLCALC No results found for: TRIG No results found for: CHOLHDL  No results found for: LDLDIRECT    Radiology: No results found.  EKG: SR rate 81 normal    ASSESSMENT AND PLAN:   1. CHF:  Historical diastolic CHF  Echo with mildly decreased EF on diuretic,ace and beta blocker no need For further w/u or change in meds given advanced dementia 2. Dementia: progressive years Assisted living on [address] Has seen [facility] neuro 3. Breast Cancer :  F/u [place] on arimidex post mastectomy  4. Thyroid:  Continue replacement labs with primary  5. HTN:  On ACE with cough and COPD  F/u primary consider changing to ARB 6. COPD:  F/u [place] previus smoker severe noctural oxygen and spiriva Rx Dyspnea more related to this than heart   F/U cardiology PRN   Signed: Jenkins Rouge 02/22/2020, 10:27 AM

## 2020-02-22 ENCOUNTER — Ambulatory Visit (INDEPENDENT_AMBULATORY_CARE_PROVIDER_SITE_OTHER): Payer: Medicare Other | Admitting: Cardiovascular Disease

## 2020-02-22 ENCOUNTER — Other Ambulatory Visit: Payer: Self-pay

## 2020-02-22 VITALS — BP 134/64 | HR 86 | Ht 60.0 in | Wt 121.0 lb

## 2020-02-22 DIAGNOSIS — I5043 Acute on chronic combined systolic (congestive) and diastolic (congestive) heart failure: Secondary | ICD-10-CM | POA: Diagnosis not present

## 2020-02-22 DIAGNOSIS — I5032 Chronic diastolic (congestive) heart failure: Secondary | ICD-10-CM | POA: Diagnosis not present

## 2020-02-22 DIAGNOSIS — I10 Essential (primary) hypertension: Secondary | ICD-10-CM | POA: Diagnosis not present

## 2020-02-22 NOTE — Patient Instructions (Addendum)
Medication Instructions:  *If you need a refill on your cardiac medications before your next appointment, please call your pharmacy*  Lab Work: If you have labs (blood work) drawn today and your tests are completely normal, you will receive your results only by: . MyChart Message (if you have MyChart) OR . A paper copy in the mail If you have any lab test that is abnormal or we need to change your treatment, we will call you to review the results.  Testing/Procedures: None ordered today.  Follow-Up: At [facility], you and your health needs are our priority.  As part of our continuing mission to provide you with exceptional heart care, we have created designated Provider Care Teams.  These Care Teams include your primary Cardiologist (physician) and Advanced Practice Providers (APPs -  Physician Assistants and Nurse Practitioners) who all work together to provide you with the care you need, when you need it.  We recommend signing up for the patient portal called "MyChart".  Sign up information is provided on this After Visit Summary.  MyChart is used to connect with patients for Virtual Visits (Telemedicine).  Patients are able to view lab/test results, encounter notes, upcoming appointments, etc.  Non-urgent messages can be sent to your provider as well.   To learn more about what you can do with MyChart, go to [email or web address].    Your next appointment:   As needed  The format for your next appointment:   In Person  Provider:   You may see Dr. Nishan or one of the following Advanced Practice Providers on your designated Care Team:    Lori Gerhardt, NP  Laura Ingold, NP  Jill McDaniel, NP   

## 2020-05-15 ENCOUNTER — Encounter: Payer: Self-pay | Admitting: Hematology and Oncology

## 2020-09-16 ENCOUNTER — Emergency Department (HOSPITAL_COMMUNITY): Payer: Medicare Other

## 2020-09-16 ENCOUNTER — Other Ambulatory Visit: Payer: Self-pay

## 2020-09-16 ENCOUNTER — Emergency Department (HOSPITAL_COMMUNITY)
Admission: EM | Admit: 2020-09-16 | Discharge: 2020-09-17 | Disposition: A | Discharge status: Home / Self Care | Payer: Medicare Other | Source: Home / Self Care | Attending: Emergency Medicine | Admitting: Emergency Medicine

## 2020-09-16 ENCOUNTER — Encounter (HOSPITAL_COMMUNITY): Payer: Self-pay

## 2020-09-16 DIAGNOSIS — I5032 Chronic diastolic (congestive) heart failure: Secondary | ICD-10-CM | POA: Insufficient documentation

## 2020-09-16 DIAGNOSIS — Z87891 Personal history of nicotine dependence: Secondary | ICD-10-CM | POA: Insufficient documentation

## 2020-09-16 DIAGNOSIS — Z853 Personal history of malignant neoplasm of breast: Secondary | ICD-10-CM | POA: Insufficient documentation

## 2020-09-16 DIAGNOSIS — R0781 Pleurodynia: Secondary | ICD-10-CM | POA: Insufficient documentation

## 2020-09-16 DIAGNOSIS — Z7951 Long term (current) use of inhaled steroids: Secondary | ICD-10-CM | POA: Insufficient documentation

## 2020-09-16 DIAGNOSIS — J449 Chronic obstructive pulmonary disease, unspecified: Secondary | ICD-10-CM | POA: Insufficient documentation

## 2020-09-16 DIAGNOSIS — W19XXXA Unspecified fall, initial encounter: Secondary | ICD-10-CM

## 2020-09-16 DIAGNOSIS — W010XXA Fall on same level from slipping, tripping and stumbling without subsequent striking against object, initial encounter: Secondary | ICD-10-CM | POA: Insufficient documentation

## 2020-09-16 DIAGNOSIS — F0390 Unspecified dementia without behavioral disturbance: Secondary | ICD-10-CM | POA: Insufficient documentation

## 2020-09-16 NOTE — ED Triage Notes (Signed)
Pt to ED by EMS from [facility] following mechanical fall, no LOC, no blood thinners. Pt c/o R lower rib pain, no DCAP BTLS noted. Arrives at baseline orrientation, VSS, NADN.

## 2020-09-16 NOTE — ED Provider Notes (Signed)
[facility] DEPT Provider Note   CSN: [id number] Arrival date & time: 09/16/20  2152     History Chief Complaint  Patient presents with  . Fall    [name] is a 80 y.o. female with PMH/o CHF, COPD, Dementia, BIB EMS for a fall. Patient denies any pain with me.  She apparently told EMS that her chest was hurting on the right lower side.  When I ask patient, she denies any pain.  EM LEVEL 5 CAVEAT: DUE TO DEMENTIA   The history is provided by the EMS personnel.       Past Medical History:  Diagnosis Date  . Anxiety   . Arthritis   . Cancer [facility])    breast CA 2002  . CHF (congestive heart failure) ([place])   . COPD (chronic obstructive pulmonary disease) ([place])   . Dementia ([place])   . Hypercholesterolemia   . Hyperthyroidism   . Osteopenia     Patient Active Problem List   Diagnosis Date Noted  . COPD (chronic obstructive pulmonary disease) ([place]) 01/30/2016  . Cough 01/30/2016  . Breast cancer of lower-inner quadrant of left female breast ([place]) 01/29/2016  . GI bleed 09/26/2015  . Acute GI bleeding 09/26/2015  . Chest pain 09/26/2015  . Chronic diastolic CHF (congestive heart failure) ([place]) 09/26/2015  . HLD (hyperlipidemia) 09/26/2015  . GI bleeding 09/26/2015  . Acute pulmonary embolism ([place]) 07/13/2013  . Acute renal failure ([place]) 07/08/2013  . Metabolic acidosis 96/22/2979  . Hypokalemia 07/08/2013  . Anemia 07/08/2013  . History of breast cancer 07/08/2013  . CHF (congestive heart failure) ([place]) 07/08/2013  . Diarrhea 07/08/2013  . Renal failure 07/07/2013    Past Surgical History:  Procedure Laterality Date  . ABDOMINAL HYSTERECTOMY    . APPENDECTOMY    . BACK SURGERY    . BREAST SURGERY    . MASTECTOMY W/ SENTINEL NODE BIOPSY Left 02/26/2016   Procedure: LEFT MASTECTOMY WITH SENTINEL LYMPH NODE BIOPSY;  Surgeon: Coralie Keens, MD;  Location: [place];  Service: General;  Laterality: Left;     OB History   No  obstetric history on file.     Family History  Problem Relation Age of Onset  . Stroke Father   . Diabetes Mellitus I Son   . Leukemia Son     Social History   Tobacco Use  . Smoking status: Former Smoker    Quit date: 02/03/1994    Years since quitting: 26.6  . Smokeless tobacco: Never Used  Substance Use Topics  . Alcohol use: Yes    Comment: 02/04/16 1 glass  wine daily  . Drug use: No    Home Medications Prior to Admission medications   Medication Sig Start Date End Date Taking? Authorizing Provider  anastrozole (ARIMIDEX) 1 MG tablet Take 1 tablet (1 mg total) by mouth daily. 09/11/16  Yes Nicholas Lose, MD  atorvastatin (LIPITOR) 40 MG tablet Take 40 mg by mouth daily.   Yes [provider]  budesonide-formoterol (SYMBICORT) 80-4.5 MCG/ACT inhaler Inhale 2 puffs into the lungs 2 (two) times daily.   Yes [provider]  carvedilol (COREG) 3.125 MG tablet Take 3.125 mg by mouth 2 (two) times daily with a meal.   Yes [provider]  cholecalciferol (VITAMIN D3) 25 MCG (1000 UNIT) tablet Take 2,000 Units by mouth daily.   Yes [provider]  famotidine (PEPCID) 40 MG tablet Take 40 mg by mouth daily.   Yes [provider]  furosemide (LASIX) 20 MG tablet Take 20 mg by mouth daily.  11/14/15  Yes [provider]  gabapentin (NEURONTIN) 100 MG capsule Take 200 mg by mouth 2 (two) times daily.    Yes [provider]  levothyroxine (SYNTHROID, LEVOTHROID) 25 MCG tablet Take 25 mcg by mouth daily before breakfast.  01/23/16  Yes [provider]  memantine (NAMENDA) 10 MG tablet Take 1 tablet (10 mg total) by mouth 2 (two) times daily. 03/17/16  Yes Penumalli, Earlean Polka, MD  mirabegron ER (MYRBETRIQ) 25 MG TB24 tablet Take 25 mg by mouth daily.   Yes [provider]  Multiple Vitamin (MULTIVITAMIN WITH MINERALS) TABS tablet Take 2 tablets by mouth daily.    Yes [provider]  OXYGEN Inhale 2 L  into the lungs See admin instructions. 2 Liters per minute during the day as needed for shortness of breath, and 2 liters per minute at night   Yes [provider]  potassium chloride SA (K-DUR,KLOR-CON) 20 MEQ tablet Take 20 mEq by mouth daily.  08/31/15  Yes [provider]  venlafaxine XR (EFFEXOR-XR) 150 MG 24 hr capsule Take 150 mg by mouth daily with breakfast.   Yes [provider]  lisinopril (PRINIVIL,ZESTRIL) 2.5 MG tablet Take 1 tablet (2.5 mg total) by mouth daily. Patient not taking: Reported on 09/16/2020 07/14/13   Velvet Bathe, MD    Allergies    Patient has no known allergies.  Review of Systems   Review of Systems  Unable to perform ROS: Mental status change    Physical Exam Updated Vital Signs BP (!) 101/49 (BP Location: Right Arm)   Pulse 91   Temp 97.9 F (36.6 C) (Oral)   Resp 15   Ht 5' (1.524 m)   Wt 55 kg   SpO2 94%   BMI 23.68 kg/m   Physical Exam Vitals and nursing note reviewed.  Constitutional:      Appearance: Normal appearance. She is well-developed.  HENT:     Head: Normocephalic and atraumatic.     Comments: No tenderness to palpation of skull. No deformities or crepitus noted. No open wounds, abrasions or lacerations.  Eyes:     General: Lids are normal.     Conjunctiva/sclera: Conjunctivae normal.     Pupils: Pupils are equal, round, and reactive to light.     Comments: PERRL. EOMs intact. No nystagmus. No neglect.   Neck:     Comments: C collar in place Cardiovascular:     Rate and Rhythm: Normal rate and regular rhythm.     Pulses: Normal pulses.     Heart sounds: Normal heart sounds. No murmur heard. No friction rub. No gallop.   Pulmonary:     Effort: Pulmonary effort is normal.     Comments: Lungs clear to auscultation bilaterally.  Symmetric chest rise.  No wheezing, rales, rhonchi.  Chest:     Comments: No anterior chest wall tenderness.  No deformity or crepitus noted.  No evidence of flail  chest. Abdominal:     Palpations: Abdomen is soft. Abdomen is not rigid.     Tenderness: There is no abdominal tenderness. There is no guarding.     Comments: Abdomen is soft, non-distended, non-tender. No rigidity, No guarding. No peritoneal signs.  Musculoskeletal:        General: Normal range of motion.     Comments: No tenderness to palpation to bilateral shoulders, clavicles, elbows, and wrists. No deformities or crepitus noted. FROM  of BUE without difficulty. No tenderness to palpation to bilateral knees and ankles. No deformities or crepitus noted. FROM of BLE without any difficulty. No midline tenderness noted to T or L spine.   Skin:    General: Skin is warm and dry.     Capillary Refill: Capillary refill takes less than 2 seconds.  Neurological:     Mental Status: She is alert.     Comments: Alert and oriented x 2  Follows commands.  MAE  Psychiatric:        Speech: Speech normal.     ED Results / Procedures / Treatments   Labs (all labs ordered are listed, but only abnormal results are displayed) Labs Reviewed - No data to display  EKG None  Radiology DG Chest 2 View  Result Date: 09/16/2020 CLINICAL DATA:  Lower right rib pain after a fall EXAM: CHEST - 2 VIEW COMPARISON:  09/27/2015 FINDINGS: Heart size and pulmonary vascularity are normal. Emphysematous changes and scattered fibrosis in the lungs. Calcified granuloma in the right upper lung. Infiltration in the right lower lung posteriorly with small right pleural effusion, likely pneumonia. In the setting of trauma, this could also indicate pulmonary contusion. No displaced rib fractures are identified although rib views were not obtained. IMPRESSION: 1. Emphysematous changes and fibrosis in the lungs. 2. Infiltration in the right lower lung with small right pleural effusion, likely pneumonia. Electronically Signed   By: Lucienne Capers M.D.   On: 09/16/2020 23:07   CT Head Wo Contrast  Result Date:  09/17/2020 CLINICAL DATA:  Mechanical fall. EXAM: CT HEAD WITHOUT CONTRAST CT CERVICAL SPINE WITHOUT CONTRAST TECHNIQUE: Multidetector CT imaging of the head and cervical spine was performed following the standard protocol without intravenous contrast. Multiplanar CT image reconstructions of the cervical spine were also generated. COMPARISON:  None. FINDINGS: CT HEAD FINDINGS Brain: Diffuse cerebral atrophy. Ventricular dilatation likely due to central atrophy. Low-attenuation changes in the deep white matter consistent with small vessel ischemia. No mass-effect or midline shift. No abnormal extra-axial fluid collections. 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 Basal cisterns are not effaced. No acute intracranial hemorrhage. Vascular: Moderate intracranial arterial vascular calcifications. Skull: The calvarium appears intact. Sinuses/Orbits: Paranasal sinuses and mastoid air cells are clear. Other: None. CT CERVICAL SPINE FINDINGS Alignment: Normal alignment. Skull base and vertebrae: No acute fracture. No primary bone lesion or focal pathologic process. Soft tissues and spinal canal: No prevertebral fluid or swelling. No visible canal hematoma. Disc levels: Degenerative changes with disc space narrowing and endplate hypertrophic changes throughout. Changes are most prominent at C4-5 and C5-6 levels. Prominent disc osteophyte complex at C5-6 level. Upper chest: Emphysematous changes and scarring in the lung apices. Other: None. IMPRESSION: 1. No acute intracranial abnormalities. Chronic atrophy and small vessel ischemic changes. 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 2. Normal alignment of the cervical spine. Degenerative changes. No acute displaced fractures identified. 3. Emphysematous changes and scarring in the lung apices. Emphysema (ICD10-J43.9). Electronically Signed   By: Lucienne Capers M.D.   On:  09/17/2020 00:29   CT Cervical Spine Wo Contrast  Result Date: 09/17/2020 CLINICAL DATA:  Mechanical fall. EXAM: CT HEAD WITHOUT CONTRAST CT CERVICAL SPINE WITHOUT CONTRAST TECHNIQUE: Multidetector CT imaging of the head and cervical spine was performed following the standard protocol without intravenous contrast. Multiplanar CT image reconstructions of the cervical spine were also generated. COMPARISON:  None. FINDINGS: CT HEAD  FINDINGS Brain: Diffuse cerebral atrophy. Ventricular dilatation likely due to central atrophy. Low-attenuation changes in the deep white matter consistent with small vessel ischemia. No mass-effect or midline shift. No abnormal extra-axial fluid collections. 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 Basal cisterns are not effaced. No acute intracranial hemorrhage. Vascular: Moderate intracranial arterial vascular calcifications. Skull: The calvarium appears intact. Sinuses/Orbits: Paranasal sinuses and mastoid air cells are clear. Other: None. CT CERVICAL SPINE FINDINGS Alignment: Normal alignment. Skull base and vertebrae: No acute fracture. No primary bone lesion or focal pathologic process. Soft tissues and spinal canal: No prevertebral fluid or swelling. No visible canal hematoma. Disc levels: Degenerative changes with disc space narrowing and endplate hypertrophic changes throughout. Changes are most prominent at C4-5 and C5-6 levels. Prominent disc osteophyte complex at C5-6 level. Upper chest: Emphysematous changes and scarring in the lung apices. Other: None. IMPRESSION: 1. No acute intracranial abnormalities. Chronic atrophy and small vessel ischemic changes. 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 2. Normal alignment of the cervical spine. Degenerative changes. No acute displaced fractures identified. 3. Emphysematous changes and scarring in the lung apices. Emphysema  (ICD10-J43.9). Electronically Signed   By: Lucienne Capers M.D.   On: 09/17/2020 00:29    Procedures Procedures   Medications Ordered in ED Medications - No data to display  ED Course  I have reviewed the triage vital signs and the nursing notes.  Pertinent labs & imaging results that were available during my care of the patient were reviewed by me and considered in my medical decision making (see chart for details).    MDM Rules/Calculators/A&P                          80 year old female past medical history of dementia brought in by EMS for evaluation of fall.  Patient states she does not recall what happened.  Patient denies any pain at this time.  She apparently told EMS she was having some rib pain.  On my exam, she has no tenderness palpation on the anterior chest wall and denies any pain with me.  She is not having difficulty breathing.  She is hemodynamically stable.  On my exam, she denies that.  She is able to follow my commands, I am able to passively range of motion both her bilateral upper and lower extremities without any difficulty.  No signs of deformity or crepitus noted.  We will plan for imaging of head, neck as well as chest.  Discussed with Anderson Malta at [facility].  She reports that they checked on patient and she was sitting on her buttock on the ground.  Patient states she was trying to go to the bathroom and that she fell but did not know exactly how she fell.  They did note that there was a small dog bed that was in the way and so they wonder if she tripped over that.  Patient states that she did not hit her head.  Patient has a history of dementia and normally can answer some questions but is sometimes confused.  She is at mental baseline.  CXR shows emphysematous changes and fibrosis in the lungs.  There is infiltration of right lower lung with small right pleural effusion, likely pneumonia.  Patient does not have any symptoms at this time.  CT head shows no acute  intracranial normalities.  CT C-spine shows no acute bony abnormality.  There is normal alignment of the cervical spine.  Portions of this note were generated with [employer]. Dictation errors may occur despite best attempts at proofreading.   Final Clinical Impression(s) / ED Diagnoses Final diagnoses:  Fall, initial encounter    Rx / DC Orders ED Discharge Orders    None       Desma Mcgregor 09/17/20 0509    Palumbo, April, MD 09/17/20 0762

## 2020-09-18 ENCOUNTER — Emergency Department (HOSPITAL_COMMUNITY): Payer: Medicare Other

## 2020-09-18 ENCOUNTER — Inpatient Hospital Stay (HOSPITAL_COMMUNITY): Payer: Medicare Other

## 2020-09-18 ENCOUNTER — Other Ambulatory Visit: Payer: Self-pay

## 2020-09-18 ENCOUNTER — Inpatient Hospital Stay (HOSPITAL_COMMUNITY)
Admission: EM | Admit: 2020-09-18 | Discharge: 2020-10-01 | DRG: 871 | Disposition: A | Discharge status: Skilled Nursing Facility | Payer: Medicare Other | Source: Skilled Nursing Facility | Attending: Internal Medicine | Admitting: Internal Medicine

## 2020-09-18 DIAGNOSIS — E8809 Other disorders of plasma-protein metabolism, not elsewhere classified: Secondary | ICD-10-CM | POA: Diagnosis present

## 2020-09-18 DIAGNOSIS — Z7951 Long term (current) use of inhaled steroids: Secondary | ICD-10-CM

## 2020-09-18 DIAGNOSIS — Z66 Do not resuscitate: Secondary | ICD-10-CM | POA: Diagnosis present

## 2020-09-18 DIAGNOSIS — J690 Pneumonitis due to inhalation of food and vomit: Secondary | ICD-10-CM | POA: Diagnosis present

## 2020-09-18 DIAGNOSIS — R0600 Dyspnea, unspecified: Secondary | ICD-10-CM

## 2020-09-18 DIAGNOSIS — Z7189 Other specified counseling: Secondary | ICD-10-CM | POA: Diagnosis not present

## 2020-09-18 DIAGNOSIS — E876 Hypokalemia: Secondary | ICD-10-CM | POA: Diagnosis not present

## 2020-09-18 DIAGNOSIS — I5032 Chronic diastolic (congestive) heart failure: Secondary | ICD-10-CM | POA: Diagnosis not present

## 2020-09-18 DIAGNOSIS — I509 Heart failure, unspecified: Secondary | ICD-10-CM

## 2020-09-18 DIAGNOSIS — M8580 Other specified disorders of bone density and structure, unspecified site: Secondary | ICD-10-CM | POA: Diagnosis present

## 2020-09-18 DIAGNOSIS — E0590 Thyrotoxicosis, unspecified without thyrotoxic crisis or storm: Secondary | ICD-10-CM | POA: Diagnosis present

## 2020-09-18 DIAGNOSIS — J9601 Acute respiratory failure with hypoxia: Secondary | ICD-10-CM | POA: Diagnosis not present

## 2020-09-18 DIAGNOSIS — E875 Hyperkalemia: Secondary | ICD-10-CM | POA: Diagnosis present

## 2020-09-18 DIAGNOSIS — I248 Other forms of acute ischemic heart disease: Secondary | ICD-10-CM | POA: Diagnosis present

## 2020-09-18 DIAGNOSIS — D649 Anemia, unspecified: Secondary | ICD-10-CM | POA: Diagnosis present

## 2020-09-18 DIAGNOSIS — F0391 Unspecified dementia with behavioral disturbance: Secondary | ICD-10-CM | POA: Diagnosis not present

## 2020-09-18 DIAGNOSIS — J9621 Acute and chronic respiratory failure with hypoxia: Secondary | ICD-10-CM | POA: Diagnosis present

## 2020-09-18 DIAGNOSIS — Z20822 Contact with and (suspected) exposure to covid-19: Secondary | ICD-10-CM | POA: Diagnosis present

## 2020-09-18 DIAGNOSIS — R1311 Dysphagia, oral phase: Secondary | ICD-10-CM | POA: Diagnosis present

## 2020-09-18 DIAGNOSIS — D75839 Thrombocytosis, unspecified: Secondary | ICD-10-CM

## 2020-09-18 DIAGNOSIS — R079 Chest pain, unspecified: Secondary | ICD-10-CM

## 2020-09-18 DIAGNOSIS — E032 Hypothyroidism due to medicaments and other exogenous substances: Secondary | ICD-10-CM | POA: Diagnosis present

## 2020-09-18 DIAGNOSIS — D72829 Elevated white blood cell count, unspecified: Secondary | ICD-10-CM

## 2020-09-18 DIAGNOSIS — Z4682 Encounter for fitting and adjustment of non-vascular catheter: Secondary | ICD-10-CM

## 2020-09-18 DIAGNOSIS — Z87891 Personal history of nicotine dependence: Secondary | ICD-10-CM

## 2020-09-18 DIAGNOSIS — F0390 Unspecified dementia without behavioral disturbance: Secondary | ICD-10-CM | POA: Diagnosis present

## 2020-09-18 DIAGNOSIS — F419 Anxiety disorder, unspecified: Secondary | ICD-10-CM | POA: Diagnosis present

## 2020-09-18 DIAGNOSIS — Z79899 Other long term (current) drug therapy: Secondary | ICD-10-CM

## 2020-09-18 DIAGNOSIS — D65 Disseminated intravascular coagulation [defibrination syndrome]: Secondary | ICD-10-CM | POA: Diagnosis not present

## 2020-09-18 DIAGNOSIS — E7800 Pure hypercholesterolemia, unspecified: Secondary | ICD-10-CM | POA: Diagnosis present

## 2020-09-18 DIAGNOSIS — R0602 Shortness of breath: Secondary | ICD-10-CM

## 2020-09-18 DIAGNOSIS — I5043 Acute on chronic combined systolic (congestive) and diastolic (congestive) heart failure: Secondary | ICD-10-CM | POA: Diagnosis present

## 2020-09-18 DIAGNOSIS — I110 Hypertensive heart disease with heart failure: Secondary | ICD-10-CM | POA: Diagnosis present

## 2020-09-18 DIAGNOSIS — Z9981 Dependence on supplemental oxygen: Secondary | ICD-10-CM

## 2020-09-18 DIAGNOSIS — Z90710 Acquired absence of both cervix and uterus: Secondary | ICD-10-CM

## 2020-09-18 DIAGNOSIS — Z9689 Presence of other specified functional implants: Secondary | ICD-10-CM | POA: Diagnosis not present

## 2020-09-18 DIAGNOSIS — J441 Chronic obstructive pulmonary disease with (acute) exacerbation: Secondary | ICD-10-CM

## 2020-09-18 DIAGNOSIS — Z789 Other specified health status: Secondary | ICD-10-CM | POA: Diagnosis not present

## 2020-09-18 DIAGNOSIS — R778 Other specified abnormalities of plasma proteins: Secondary | ICD-10-CM | POA: Diagnosis not present

## 2020-09-18 DIAGNOSIS — I5022 Chronic systolic (congestive) heart failure: Secondary | ICD-10-CM

## 2020-09-18 DIAGNOSIS — Z79890 Hormone replacement therapy (postmenopausal): Secondary | ICD-10-CM

## 2020-09-18 DIAGNOSIS — J189 Pneumonia, unspecified organism: Secondary | ICD-10-CM

## 2020-09-18 DIAGNOSIS — R6520 Severe sepsis without septic shock: Secondary | ICD-10-CM | POA: Diagnosis present

## 2020-09-18 DIAGNOSIS — Z9049 Acquired absence of other specified parts of digestive tract: Secondary | ICD-10-CM

## 2020-09-18 DIAGNOSIS — Z515 Encounter for palliative care: Secondary | ICD-10-CM | POA: Diagnosis not present

## 2020-09-18 DIAGNOSIS — J869 Pyothorax without fistula: Secondary | ICD-10-CM

## 2020-09-18 DIAGNOSIS — R1310 Dysphagia, unspecified: Secondary | ICD-10-CM | POA: Diagnosis not present

## 2020-09-18 DIAGNOSIS — K219 Gastro-esophageal reflux disease without esophagitis: Secondary | ICD-10-CM | POA: Diagnosis present

## 2020-09-18 DIAGNOSIS — Z9012 Acquired absence of left breast and nipple: Secondary | ICD-10-CM

## 2020-09-18 DIAGNOSIS — J449 Chronic obstructive pulmonary disease, unspecified: Secondary | ICD-10-CM | POA: Diagnosis not present

## 2020-09-18 DIAGNOSIS — Z938 Other artificial opening status: Secondary | ICD-10-CM | POA: Diagnosis not present

## 2020-09-18 DIAGNOSIS — J9600 Acute respiratory failure, unspecified whether with hypoxia or hypercapnia: Secondary | ICD-10-CM | POA: Diagnosis present

## 2020-09-18 DIAGNOSIS — Z853 Personal history of malignant neoplasm of breast: Secondary | ICD-10-CM

## 2020-09-18 DIAGNOSIS — N179 Acute kidney failure, unspecified: Secondary | ICD-10-CM | POA: Diagnosis not present

## 2020-09-18 DIAGNOSIS — J90 Pleural effusion, not elsewhere classified: Secondary | ICD-10-CM

## 2020-09-18 DIAGNOSIS — E162 Hypoglycemia, unspecified: Secondary | ICD-10-CM | POA: Diagnosis not present

## 2020-09-18 DIAGNOSIS — Z86711 Personal history of pulmonary embolism: Secondary | ICD-10-CM

## 2020-09-18 DIAGNOSIS — A419 Sepsis, unspecified organism: Secondary | ICD-10-CM | POA: Diagnosis present

## 2020-09-18 LAB — CBC
HCT: 39.5 % (ref 36.0–46.0)
Hemoglobin: 12.8 g/dL (ref 12.0–15.0)
MCH: 30.4 pg (ref 26.0–34.0)
MCHC: 32.4 g/dL (ref 30.0–36.0)
MCV: 93.8 fL (ref 80.0–100.0)
Platelets: 410 10*3/uL — ABNORMAL HIGH (ref 150–400)
RBC: 4.21 MIL/uL (ref 3.87–5.11)
RDW: 13.2 % (ref 11.5–15.5)
WBC: 47.1 10*3/uL — ABNORMAL HIGH (ref 4.0–10.5)
nRBC: 0 % (ref 0.0–0.2)

## 2020-09-18 LAB — COMPREHENSIVE METABOLIC PANEL
ALT: 6 U/L (ref 0–44)
AST: UNDETERMINED U/L (ref 15–41)
Albumin: 2.6 g/dL — ABNORMAL LOW (ref 3.5–5.0)
Alkaline Phosphatase: 172 U/L — ABNORMAL HIGH (ref 38–126)
Anion gap: 17 — ABNORMAL HIGH (ref 5–15)
BUN: 29 mg/dL — ABNORMAL HIGH (ref 8–23)
CO2: 18 mmol/L — ABNORMAL LOW (ref 22–32)
Calcium: 9.2 mg/dL (ref 8.9–10.3)
Chloride: 100 mmol/L (ref 98–111)
Creatinine, Ser: 0.93 mg/dL (ref 0.44–1.00)
GFR, Estimated: >60 mL/min (ref >60)
Glucose, Bld: 104 mg/dL — ABNORMAL HIGH (ref 70–99)
Potassium: 5.6 mmol/L — ABNORMAL HIGH (ref 3.5–5.1)
Sodium: 135 mmol/L (ref 135–145)
Total Bilirubin: 0.1 mg/dL — ABNORMAL LOW (ref 0.3–1.2)
Total Protein: 7.2 g/dL (ref 6.5–8.1)

## 2020-09-18 LAB — LACTIC ACID, PLASMA: Lactic Acid, Venous: 1.7 mmol/L (ref 0.5–1.9)

## 2020-09-18 LAB — RESP PANEL BY RT-PCR (FLU A&B, COVID) ARPGX2
Influenza A by PCR: NEGATIVE
Influenza B by PCR: NEGATIVE
SARS Coronavirus 2 by RT PCR: NEGATIVE

## 2020-09-18 LAB — BRAIN NATRIURETIC PEPTIDE: B Natriuretic Peptide: 296.9 pg/mL — ABNORMAL HIGH (ref 0.0–100.0)

## 2020-09-18 LAB — TROPONIN I (HIGH SENSITIVITY)
Troponin I (High Sensitivity): 30 ng/L — ABNORMAL HIGH (ref <18)
Troponin I (High Sensitivity): 28 ng/L — ABNORMAL HIGH (ref <18)

## 2020-09-18 LAB — SAVE SMEAR(SSMR), FOR PROVIDER SLIDE REVIEW

## 2020-09-18 LAB — D-DIMER, QUANTITATIVE: D-Dimer, Quant: 3.64 ug/mL-FEU — ABNORMAL HIGH (ref 0.00–0.50)

## 2020-09-18 LAB — TSH: TSH: 1.077 u[IU]/mL (ref 0.350–4.500)

## 2020-09-18 MED ORDER — VANCOMYCIN HCL 750 MG/150ML IV SOLN
750.0000 mg | INTRAVENOUS | Status: DC
  Administered 2020-09-19 – 2020-09-20 (×2): 750 mg via INTRAVENOUS
  Filled 2020-09-18 (×3): qty 150

## 2020-09-18 MED ORDER — ENOXAPARIN SODIUM 40 MG/0.4ML ~~LOC~~ SOLN
40.0000 mg | SUBCUTANEOUS | Status: DC
  Administered 2020-09-18 – 2020-09-23 (×6): 40 mg via SUBCUTANEOUS
  Filled 2020-09-18 (×6): qty 0.4

## 2020-09-18 MED ORDER — SODIUM CHLORIDE 0.9 % IV SOLN: INTRAVENOUS | Status: DC

## 2020-09-18 MED ORDER — BUDESONIDE 0.5 MG/2ML IN SUSP
0.5000 mg | Freq: Two times a day (BID) | RESPIRATORY_TRACT | Status: DC
  Administered 2020-09-18 – 2020-10-01 (×26): 0.5 mg via RESPIRATORY_TRACT
  Filled 2020-09-18 (×27): qty 2

## 2020-09-18 MED ORDER — FAMOTIDINE 20 MG PO TABS
40.0000 mg | ORAL_TABLET | Freq: Every day | ORAL | Status: DC
  Administered 2020-09-18 – 2020-09-30 (×13): 40 mg via ORAL
  Filled 2020-09-18 (×13): qty 2

## 2020-09-18 MED ORDER — VENLAFAXINE HCL ER 75 MG PO CP24
150.0000 mg | ORAL_CAPSULE | Freq: Every day | ORAL | Status: DC
  Administered 2020-09-19 – 2020-10-01 (×13): 150 mg via ORAL
  Filled 2020-09-18 (×3): qty 2
  Filled 2020-09-18: qty 1
  Filled 2020-09-18 (×9): qty 2

## 2020-09-18 MED ORDER — ATORVASTATIN CALCIUM 40 MG PO TABS
40.0000 mg | ORAL_TABLET | Freq: Every day | ORAL | Status: DC
  Administered 2020-09-18 – 2020-09-30 (×13): 40 mg via ORAL
  Filled 2020-09-18 (×13): qty 1

## 2020-09-18 MED ORDER — MIRABEGRON ER 25 MG PO TB24
25.0000 mg | ORAL_TABLET | Freq: Every day | ORAL | Status: DC
  Administered 2020-09-18 – 2020-10-01 (×14): 25 mg via ORAL
  Filled 2020-09-18 (×14): qty 1

## 2020-09-18 MED ORDER — LEVOTHYROXINE SODIUM 25 MCG PO TABS
25.0000 ug | ORAL_TABLET | Freq: Every day | ORAL | Status: DC
  Administered 2020-09-19 – 2020-10-01 (×13): 25 ug via ORAL
  Filled 2020-09-18 (×13): qty 1

## 2020-09-18 MED ORDER — SODIUM CHLORIDE 0.9 % IV SOLN
500.0000 mg | INTRAVENOUS | Status: DC
  Administered 2020-09-18 – 2020-09-21 (×4): 500 mg via INTRAVENOUS
  Filled 2020-09-18 (×4): qty 500

## 2020-09-18 MED ORDER — VANCOMYCIN HCL 1250 MG/250ML IV SOLN
1250.0000 mg | Freq: Once | INTRAVENOUS | Status: AC
  Administered 2020-09-18: 1250 mg via INTRAVENOUS
  Filled 2020-09-18: qty 250

## 2020-09-18 MED ORDER — IPRATROPIUM-ALBUTEROL 0.5-2.5 (3) MG/3ML IN SOLN
3.0000 mL | Freq: Four times a day (QID) | RESPIRATORY_TRACT | Status: DC
  Administered 2020-09-18 – 2020-09-19 (×5): 3 mL via RESPIRATORY_TRACT
  Filled 2020-09-18 (×5): qty 3

## 2020-09-18 MED ORDER — CARVEDILOL 6.25 MG PO TABS
3.1250 mg | ORAL_TABLET | Freq: Two times a day (BID) | ORAL | Status: DC
  Administered 2020-09-18 – 2020-10-01 (×25): 3.125 mg via ORAL
  Filled 2020-09-18 (×4): qty 1
  Filled 2020-09-18: qty 0.5
  Filled 2020-09-18 (×18): qty 1
  Filled 2020-09-18: qty 0.5
  Filled 2020-09-18 (×4): qty 1

## 2020-09-18 MED ORDER — ALBUTEROL SULFATE (2.5 MG/3ML) 0.083% IN NEBU
5.0000 mg | INHALATION_SOLUTION | Freq: Once | RESPIRATORY_TRACT | Status: AC
  Administered 2020-09-18: 5 mg via RESPIRATORY_TRACT
  Filled 2020-09-18: qty 6

## 2020-09-18 MED ORDER — GABAPENTIN 100 MG PO CAPS
200.0000 mg | ORAL_CAPSULE | Freq: Two times a day (BID) | ORAL | Status: DC
  Administered 2020-09-18 – 2020-10-01 (×26): 200 mg via ORAL
  Filled 2020-09-18 (×26): qty 2

## 2020-09-18 MED ORDER — SODIUM CHLORIDE 0.9 % IV SOLN
2.0000 g | INTRAVENOUS | Status: DC
  Administered 2020-09-18 – 2020-09-21 (×4): 2 g via INTRAVENOUS
  Filled 2020-09-18 (×4): qty 20

## 2020-09-18 MED ORDER — IPRATROPIUM BROMIDE 0.02 % IN SOLN
0.5000 mg | Freq: Once | RESPIRATORY_TRACT | Status: AC
  Administered 2020-09-18: 0.5 mg via RESPIRATORY_TRACT
  Filled 2020-09-18: qty 2.5

## 2020-09-18 MED ORDER — MEMANTINE HCL 10 MG PO TABS
10.0000 mg | ORAL_TABLET | Freq: Two times a day (BID) | ORAL | Status: DC
  Administered 2020-09-18 – 2020-10-01 (×26): 10 mg via ORAL
  Filled 2020-09-18 (×26): qty 1

## 2020-09-18 MED ORDER — SODIUM CHLORIDE 0.9 % IV BOLUS
500.0000 mL | Freq: Once | INTRAVENOUS | Status: AC
  Administered 2020-09-18: 500 mL via INTRAVENOUS

## 2020-09-18 MED ORDER — ARFORMOTEROL TARTRATE 15 MCG/2ML IN NEBU
15.0000 ug | INHALATION_SOLUTION | Freq: Two times a day (BID) | RESPIRATORY_TRACT | Status: DC
  Administered 2020-09-18 – 2020-10-01 (×26): 15 ug via RESPIRATORY_TRACT
  Filled 2020-09-18 (×27): qty 2

## 2020-09-18 MED ORDER — ALBUTEROL SULFATE (2.5 MG/3ML) 0.083% IN NEBU
2.5000 mg | INHALATION_SOLUTION | RESPIRATORY_TRACT | Status: DC | PRN
  Administered 2020-09-19: 2.5 mg via RESPIRATORY_TRACT
  Filled 2020-09-18: qty 3

## 2020-09-18 MED ORDER — METHYLPREDNISOLONE SODIUM SUCC 125 MG IJ SOLR
125.0000 mg | Freq: Once | INTRAMUSCULAR | Status: AC
  Administered 2020-09-18: 125 mg via INTRAVENOUS
  Filled 2020-09-18: qty 2

## 2020-09-18 MED ORDER — ANASTROZOLE 1 MG PO TABS
1.0000 mg | ORAL_TABLET | Freq: Every day | ORAL | Status: DC
  Administered 2020-09-18 – 2020-10-01 (×14): 1 mg via ORAL
  Filled 2020-09-18 (×14): qty 1

## 2020-09-18 MED ORDER — SODIUM CHLORIDE 0.9 % IV SOLN
2.0000 g | Freq: Once | INTRAVENOUS | Status: AC
  Administered 2020-09-18: 2 g via INTRAVENOUS
  Filled 2020-09-18: qty 2

## 2020-09-18 NOTE — Progress Notes (Signed)
Pharmacy Antibiotic Note  [name] is a 80 y.o. female admitted on 09/18/2020 with pneumonia.  Pharmacy has been consulted for vancomycin dosing.  Presenting from SNF - with increased work of breathing. WBC 47, afebrile. LA 1.7, Scr 0.9 (CrCl 38 mL/min).   Plan: Vancomycin 1250 mg IV once then 750 mg IV every 24 hours (estAUC 517) F/u if plan to continue cefepime 2g IV every 12 hours Monitor renal fx, cx results, clinical pic, and VT at SS  Height: 5\' 2"  (157.5 cm) Weight: 55 kg (121 lb 4.1 oz) IBW/kg (Calculated) : 50.1  Temp (24hrs), Avg:97.4 F (36.3 C), Min:97.4 F (36.3 C), Max:97.4 F (36.3 C)  Recent Labs  Lab 09/18/20 0800  WBC 47.1*    CrCl cannot be calculated (Patient's most recent lab result is older than the maximum 21 days allowed.).    No Known Allergies   Antimicrobials this admission: Vancomycin 4/26 >>  Cefepime 4/26 >>   Dose adjustments this admission: N/A  Microbiology results: 4/26 BCx: sent  4/26 COVID PCR: neg  Thank you for allowing pharmacy to be a part of this patient's care.  Antonietta Jewel, PharmD, [facility] Clinical Pharmacist  Phone: [phone] 09/18/2020 12:53 PM  Please check AMION for all [place] phone numbers After 10:00 PM, call [place] [phone]

## 2020-09-18 NOTE — H&P (Signed)
History and Physical    Mary CirriJulia Vaughn GNF:[id number] DOB: 01/04/1941 DOA: 09/18/2020  Referring MD/NP/PA: Cathren LaineKevin Steinl, MD PCP: Farris HasMorrow, Aaron, MD  [facility]  Chief Complaint: Shortness of breath  I have personally briefly reviewed patient's [facility]   HPI: Mary CirriJulia Hauser is a 80 y.o. female with medical history significant of COPD, systolic CHF 40-45%, hypothyroidism, breast cancer s/p left mastectomy presents from [facility] due to shortness of breath.  Due to the patient's dementia she cannot provide any significant history.  Normally patient is just supposed to be on 2 L of nasal cannula oxygen at night, but was reported to have increased work of breathing last day and appeared more short of breath with intermittent nonproductive cough.  Patient does report feeling short of breath at this time.  Talks with the patient's son and daughter[place]in[place] patient at baseline is very forgetful and has repeated questions.  Family does mention that another family member had recently visited the patient reporting that she was getting choked up and coughing while eating and talking.  Weight had been down to 117 pounds from what they can last remember.  She had just recently been seen in the emergency department 2 days ago after having a fall with complaints of chest pain.    ED Course: Upon admission to the emergency department patient was seen to be afebrile with pulse 91-105, respirations 20-40, blood pressures 97/42-145/77, and O2 saturations currently maintained on nonrebreather.  Labs are significant for WBC 47.1, hemoglobin 12.8, potassium 5.6, CO2 18, BUN 29, creatinine 0.93, anion gap 17, alkaline phosphatase 172, high-sensitivity troponin 30, BNP 196.9 and lactic acid 0.7.  Chest x-ray showed right lung base infiltrate with moderate right-sided pleural effusion.  She had been given 500 mL bolus of normal saline IV  fluids, vancomycin, cefepime, and DuoNeb breathing treatment.  Review of Systems  Unable to perform ROS: Dementia  Respiratory: Positive for cough and shortness of breath.     Past Medical History:  Diagnosis Date  . Anxiety   . Arthritis   . Cancer [facility](HCC)    breast CA 2002  . CHF (congestive heart failure) (HCC)   . COPD (chronic obstructive pulmonary disease) (HCC)   . Dementia (HCC)   . Hypercholesterolemia   . Hyperthyroidism   . Osteopenia     Past Surgical History:  Procedure Laterality Date  . ABDOMINAL HYSTERECTOMY    . APPENDECTOMY    . BACK SURGERY    . BREAST SURGERY    . MASTECTOMY W/ SENTINEL NODE BIOPSY Left 02/26/2016   Procedure: LEFT MASTECTOMY WITH SENTINEL LYMPH NODE BIOPSY;  Surgeon: Abigail Miyamotoouglas Blackman, MD;  Location: [facility] OR;  Service: General;  Laterality: Left;     reports that she quit smoking about 26 years ago. She has never used smokeless tobacco. She reports current alcohol use. She reports that she does not use drugs.  No Known Allergies  Family History  Problem Relation Age of Onset  . Stroke Father   . Diabetes Mellitus I Son   . Leukemia Son     Prior to Admission medications   Medication Sig Start Date End Date Taking? Authorizing Provider  acetaminophen (TYLENOL) 650 MG CR tablet Take 650 mg by mouth in the morning and at bedtime.   Yes [provider]  anastrozole (ARIMIDEX) 1 MG tablet Take 1 tablet (1 mg total) by mouth daily. 09/11/16  Yes Gudena,  Vinay, MD  atorvastatin (LIPITOR) 40 MG tablet Take 40 mg by mouth at bedtime.   Yes [provider]  budesonide-formoterol (SYMBICORT) 80-4.5 MCG/ACT inhaler Inhale 2 puffs into the lungs 2 (two) times daily.   Yes [provider]  Calcium Citrate-Vitamin D (CALCIUM CITRATE PETITE/VIT D PO) Take 1 tablet by mouth in the morning.   Yes [provider]  carvedilol (COREG) 3.125 MG tablet Take 3.125 mg by mouth 2 (two) times daily with a meal.   Yes [provider]  Cholecalciferol (VITAMIN D3) 50 MCG (2000 UT) TABS Take 2,000 Units by mouth in the morning.   Yes [provider]  famotidine (PEPCID) 40 MG tablet Take 40 mg by mouth at bedtime.   Yes [provider]  furosemide (LASIX) 20 MG tablet Take 20 mg by mouth in the morning. 11/14/15  Yes [provider]  gabapentin (NEURONTIN) 100 MG capsule Take 200 mg by mouth 2 (two) times daily.    Yes [provider]  levothyroxine (SYNTHROID, LEVOTHROID) 25 MCG tablet Take 25 mcg by mouth daily before breakfast.  01/23/16  Yes [provider]  memantine (NAMENDA) 10 MG tablet Take 1 tablet (10 mg total) by mouth 2 (two) times daily. 03/17/16  Yes Penumalli, Earlean Polka, MD  mirabegron ER (MYRBETRIQ) 25 MG TB24 tablet Take 25 mg by mouth daily.   Yes [provider]  Multiple Vitamin (MULTIVITAMIN WITH MINERALS) TABS tablet Take 1 tablet by mouth daily.   Yes [provider]  OXYGEN Inhale 2 L/min into the lungs See admin instructions. 2 L/min at bedtime and an additional 2 L/min as needed for shortness of breath   Yes [provider]  potassium chloride SA (K-DUR,KLOR-CON) 20 MEQ tablet Take 20 mEq by mouth daily.  08/31/15  Yes [provider]  venlafaxine XR (EFFEXOR-XR) 150 MG 24 hr capsule Take 150 mg by mouth daily with breakfast.   Yes [provider]  lisinopril (PRINIVIL,ZESTRIL) 2.5 MG tablet Take 1 tablet (2.5 mg total) by mouth daily. Patient not taking: Reported on 09/18/2020 07/14/13   Velvet Bathe, MD    Physical Exam:  Constitutional: Elderly female with labored breathing Vitals:   09/18/20 1215 09/18/20 1230 09/18/20 1245 09/18/20 1330  BP: 104/71 116/76 124/68 120/69  Pulse: 91 91 96 94  Resp: (!) 25 (!) 27 (!) 30 (!) 40  Temp:      TempSrc:      SpO2: 97% 96% 95% 97%  Weight:      Height:       Eyes: PERRL, lids and conjunctivae normal ENMT: Mucous membranes are dry. Posterior pharynx  clear of any exudate or lesions.  Neck: normal, supple, no masses, no thyromegaly Respiratory: Tachypneic with diminished breath sounds noted most significantly on the right lower lung field. expiratory wheeze and rales appreciated.  Off oxygen patient's O2 saturation dropping as low as 87%.  Nasal cannula oxygen on 4 L reapplied and improvement in O2 saturations to greater than 92%. Cardiovascular: Regular rate and rhythm, no murmurs / rubs / gallops. No extremity edema. 2+ pedal pulses. No carotid bruits.  Abdomen: no tenderness, no masses palpated. No hepatosplenomegaly. Bowel sounds positive.  Musculoskeletal: no clubbing / cyanosis. No joint deformity upper and lower extremities. Good ROM, no contractures. Normal muscle tone.  Skin: no rashes, lesions, ulcers. No induration Neurologic: CN 2-12 grossly intact.  Able to move all extremities. Psychiatric: Dementia alert and oriented only to self.  Not place  time, or situation.    Labs on Admission: I have personally reviewed following labs and imaging studies  CBC: Recent Labs  Lab 09/18/20 0800  WBC 47.1*  HGB 12.8  HCT 39.5  MCV 93.8  PLT 270*   Basic Metabolic Panel: Recent Labs  Lab 09/18/20 0945  NA 135  K 5.6*  CL 100  CO2 18*  GLUCOSE 104*  BUN 29*  CREATININE 0.93  CALCIUM 9.2   GFR: Estimated Creatinine Clearance: 38.8 mL/min (by C-G formula based on SCr of 0.93 mg/dL). Liver Function Tests: Recent Labs  Lab 09/18/20 0945  AST QUANTITY NOT SUFFICIENT, UNABLE TO PERFORM TEST  ALT 6  ALKPHOS 172*  BILITOT 0.1*  PROT 7.2  ALBUMIN 2.6*   No results for input(s): LIPASE, AMYLASE in the last 168 hours. No results for input(s): AMMONIA in the last 168 hours. Coagulation Profile: No results for input(s): INR, PROTIME in the last 168 hours. Cardiac Enzymes: No results for input(s): CKTOTAL, CKMB, CKMBINDEX, TROPONINI in the last 168 hours. BNP (last 3 results) No results for input(s): PROBNP in the last 8760  hours. HbA1C: No results for input(s): HGBA1C in the last 72 hours. CBG: No results for input(s): GLUCAP in the last 168 hours. Lipid Profile: No results for input(s): CHOL, HDL, LDLCALC, TRIG, CHOLHDL, LDLDIRECT in the last 72 hours. Thyroid Function Tests: No results for input(s): TSH, T4TOTAL, FREET4, T3FREE, THYROIDAB in the last 72 hours. Anemia Panel: No results for input(s): VITAMINB12, FOLATE, FERRITIN, TIBC, IRON, RETICCTPCT in the last 72 hours. Urine analysis:    Component Value Date/Time   COLORURINE AMBER (A) 07/07/2013 2348   APPEARANCEUR CLOUDY (A) 07/07/2013 2348   LABSPEC 1.023 07/07/2013 2348   PHURINE 5.0 07/07/2013 2348   GLUCOSEU NEGATIVE 07/07/2013 2348   HGBUR NEGATIVE 07/07/2013 2348   BILIRUBINUR NEGATIVE 07/07/2013 2348   KETONESUR 15 (A) 07/07/2013 2348   PROTEINUR 30 (A) 07/07/2013 2348   UROBILINOGEN 0.2 07/07/2013 2348   NITRITE NEGATIVE 07/07/2013 2348   LEUKOCYTESUR NEGATIVE 07/07/2013 2348   Sepsis Labs: Recent Results (from the past 240 hour(s))  Resp Panel by RT-PCR (Flu A&B, Covid) Nasopharyngeal Swab     Status: None   Collection Time: 09/18/20  8:00 AM   Specimen: Nasopharyngeal Swab; Nasopharyngeal(NP) swabs in vial transport medium  Result Value Ref Range Status   SARS Coronavirus 2 by RT PCR NEGATIVE NEGATIVE Final    Comment: (NOTE) SARS-CoV-2 target nucleic acids are NOT DETECTED.  The SARS-CoV-2 RNA is generally detectable in upper respiratory specimens during the acute phase of infection. The lowest concentration of SARS-CoV-2 viral copies this assay can detect is 138 copies/mL. A negative result does not preclude SARS-Cov-2 infection and should not be used as the sole basis for treatment or other patient management decisions. A negative result may occur with  improper specimen collection/handling, submission of specimen other than nasopharyngeal swab, presence of viral mutation(s) within the areas targeted by this assay, and  inadequate number of viral copies(<138 copies/mL). A negative result must be combined with clinical observations, patient history, and epidemiological information. The expected result is Negative.  Fact Sheet for Patients:  EntrepreneurPulse.com.au  Fact Sheet for Healthcare Providers:  IncredibleEmployment.be  This test is no t yet approved or cleared by the [place] FDA and  has been authorized for detection and/or diagnosis of SARS-CoV-2 by FDA under an Emergency Use Authorization (EUA). This EUA will remain  in effect (meaning this test can be used) for the duration of  the COVID-19 declaration under Section 564(b)(1) of the Act, 21 U.S.C.section 360bbb-3(b)(1), unless the authorization is terminated  or revoked sooner.       Influenza A by PCR NEGATIVE NEGATIVE Final   Influenza B by PCR NEGATIVE NEGATIVE Final    Comment: (NOTE) The Xpert Xpress SARS-CoV-2/FLU/RSV plus assay is intended as an aid in the diagnosis of influenza from Nasopharyngeal swab specimens and should not be used as a sole basis for treatment. Nasal washings and aspirates are unacceptable for Xpert Xpress SARS-CoV-2/FLU/RSV testing.  Fact Sheet for Patients: EntrepreneurPulse.com.au  Fact Sheet for Healthcare Providers: IncredibleEmployment.be  This test is not yet approved or cleared by the [place] FDA and has been authorized for detection and/or diagnosis of SARS-CoV-2 by FDA under an Emergency Use Authorization (EUA). This EUA will remain in effect (meaning this test can be used) for the duration of the COVID-19 declaration under Section 564(b)(1) of the Act, 21 U.S.C. section 360bbb-3(b)(1), unless the authorization is terminated or revoked.  Performed at [facility], [place] [address] [place], [place] [address]      Radiological Exams on Admission: DG Chest 2 View  Result Date: 09/16/2020 CLINICAL  DATA:  Lower right rib pain after a fall EXAM: CHEST - 2 VIEW COMPARISON:  09/27/2015 FINDINGS: Heart size and pulmonary vascularity are normal. Emphysematous changes and scattered fibrosis in the lungs. Calcified granuloma in the right upper lung. Infiltration in the right lower lung posteriorly with small right pleural effusion, likely pneumonia. In the setting of trauma, this could also indicate pulmonary contusion. No displaced rib fractures are identified although rib views were not obtained. IMPRESSION: 1. Emphysematous changes and fibrosis in the lungs. 2. Infiltration in the right lower lung with small right pleural effusion, likely pneumonia. Electronically Signed   By: Lucienne Capers M.D.   On: 09/16/2020 23:07   CT Head Wo Contrast  Result Date: 09/17/2020 CLINICAL DATA:  Mechanical fall. EXAM: CT HEAD WITHOUT CONTRAST CT CERVICAL SPINE WITHOUT CONTRAST TECHNIQUE: Multidetector CT imaging of the head and cervical spine was performed following the standard protocol without intravenous contrast. Multiplanar CT image reconstructions of the cervical spine were also generated. COMPARISON:  None. FINDINGS: CT HEAD FINDINGS Brain: Diffuse cerebral atrophy. Ventricular dilatation likely due to central atrophy. Low-attenuation changes in the deep white matter consistent with small vessel ischemia. No mass-effect or midline shift. No abnormal extra-axial fluid collections. 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 Basal cisterns are not effaced. No acute intracranial hemorrhage. Vascular: Moderate intracranial arterial vascular calcifications. Skull: The calvarium appears intact. Sinuses/Orbits: Paranasal sinuses and mastoid air cells are clear. Other: None. CT CERVICAL SPINE FINDINGS Alignment: Normal alignment. Skull base and vertebrae: No acute fracture. No primary bone lesion or focal pathologic process. Soft tissues and spinal canal: No  prevertebral fluid or swelling. No visible canal hematoma. Disc levels: Degenerative changes with disc space narrowing and endplate hypertrophic changes throughout. Changes are most prominent at C4-5 and C5-6 levels. Prominent disc osteophyte complex at C5-6 level. Upper chest: Emphysematous changes and scarring in the lung apices. Other: None. IMPRESSION: 1. No acute intracranial abnormalities. Chronic atrophy and small vessel ischemic changes. 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 2. Normal alignment of the cervical spine. Degenerative changes. No acute displaced fractures identified. 3. Emphysematous changes and scarring in the lung apices. Emphysema (ICD10-J43.9). Electronically Signed   By: Lucienne Capers M.D.   On: 09/17/2020 00:29  CT Cervical Spine Wo Contrast  Result Date: 09/17/2020 CLINICAL DATA:  Mechanical fall. EXAM: CT HEAD WITHOUT CONTRAST CT CERVICAL SPINE WITHOUT CONTRAST TECHNIQUE: Multidetector CT imaging of the head and cervical spine was performed following the standard protocol without intravenous contrast. Multiplanar CT image reconstructions of the cervical spine were also generated. COMPARISON:  None. FINDINGS: CT HEAD FINDINGS Brain: Diffuse cerebral atrophy. Ventricular dilatation likely due to central atrophy. Low-attenuation changes in the deep white matter consistent with small vessel ischemia. No mass-effect or midline shift. No abnormal extra-axial fluid collections. 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 Basal cisterns are not effaced. No acute intracranial hemorrhage. Vascular: Moderate intracranial arterial vascular calcifications. Skull: The calvarium appears intact. Sinuses/Orbits: Paranasal sinuses and mastoid air cells are clear. Other: None. CT CERVICAL SPINE FINDINGS Alignment: Normal alignment. Skull base and vertebrae: No acute fracture. No primary bone lesion  or focal pathologic process. Soft tissues and spinal canal: No prevertebral fluid or swelling. No visible canal hematoma. Disc levels: Degenerative changes with disc space narrowing and endplate hypertrophic changes throughout. Changes are most prominent at C4-5 and C5-6 levels. Prominent disc osteophyte complex at C5-6 level. Upper chest: Emphysematous changes and scarring in the lung apices. Other: None. IMPRESSION: 1. No acute intracranial abnormalities. Chronic atrophy and small vessel ischemic changes. 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 2. Normal alignment of the cervical spine. Degenerative changes. No acute displaced fractures identified. 3. Emphysematous changes and scarring in the lung apices. Emphysema (ICD10-J43.9). Electronically Signed   By: Lucienne Capers M.D.   On: 09/17/2020 00:29   DG Chest Port 1 View  Result Date: 09/18/2020 CLINICAL DATA:  Shortness of breath. EXAM: PORTABLE CHEST 1 VIEW COMPARISON:  09/16/2020. FINDINGS: Heart size normal. Right base infiltrate with moderate right pleural effusion. Calcified nodule right upper lung again noted consistent calcified granuloma. No pneumothorax. No acute bony abnormality. Splenic calcifications consistent granulomas again noted. IMPRESSION: Right base infiltrate with moderate right pleural effusion. Electronically Signed   By: Marcello Moores  Register   On: 09/18/2020 08:21    EKG: Independently reviewed.  Sinus tachycardia 103 bpm with nonspecific ST wave changes.  Assessment/Plan  Sepsis secondary to right empyema with respiratory failure with hypoxia: Acute.  Patient was noted to be tachypneic with O2 saturations on his dropping as low as 87% on nasal cannula oxygen with improvement on 4 L.  Patient was noted to be afebrile but was initially tachycardic and tachypneic with white blood cell count elevated at 47.1.  Lactic acid was reassuring at 1.7 but patient was noted to have a acute respiratory  failure.  Initially chest x-ray significant for concern for right-sided pneumonia with moderate pleural effusion.  Patient has been started on empiric antibiotics of vancomycin and cefepime.  Family had concern for the possibility of aspiration.  Followed up with CT scan of the chest which was significant for loculated right pleural effusion and compressive atelectasis. -Admit to a telemetry bed -Continuous pulse oximetry with nasal cannula oxygen to maintain O2 saturation greater than 92% -Follow-up blood and sputum cultures -Continue empiric antibiotics of vancomycin, azithromycin, and Rocephin -Speech therapy consult to evaluate for patient swallowing -Recheck CBC in -Dr. Cyndia Bent of Cardiothoracic surgery, will follow-up for any further recommendations  Leukocytosis and thrombocytosis: Acute.  WBC elevated at 47.1 and platelet count 410.  Suspect this is reactive to above -Check peripheral smear -Add on LDH -Continue to monitor  COPD exacerbation: Patient  with expiratory wheeze appreciated on physical exam.  Patient had been given 125 mg of Solu-Medrol IV had DuoNeb breathing treatment. -DuoNebs 4 times daily and as needed every 2 hours as needed for shortness of breath/wheeze -Brovana and budesonide nebs  Elevated troponin: Patient denies any reports of chest pain.  High-sensitivity troponin 30, but EKG without significant ischemic change. Suspect secondary to demand -Continue to monitor  Systolic congestive heart failure exacerbation: Stable.    Last echocardiogram revealed EF of 40 to 45%.  BNP was elevated at 296.9. Patient does not appear to be acutely fluid overloaded at this time. -Strict intake and output  -Daily weights  Essential hypertension: Blood pressures currently stable.  Home blood pressure medications include Coreg 3.125 mg twice daily and furosemide 20 mg daily. -Held furosemide -Continue Coreg as tolerated  Hyperkalemia: Initial potassium elevated at 5.6.  No  significant EKG changes -Gentle IV fluids normal saline IV fluids overnight -Recheck potassium in a.m.  Dementia: Patient has very significant dementia per family. -Tourist information centre manager -Set bed alarm  Hypothyroidism -Check TSH -Continue levothyroxine  Hypoalbuminemia: Acute.  Initial albumin 2.6 -Check prealbumin in a.m.  History of breast cancer  GERD -Continue pepcid  DVT prophylaxis: Lovenox Code Status: Full Family Communication: Son and daughter-in-law updated over the phone Disposition Plan: Likely discharge back to skilled nursing facility Consults called: Cardiothoracic surgeon Admission status: Inpatient require more than 2 midnight stay for IV antibiotics  Norval Morton MD [facility]   If 7PM-7AM, please contact night-coverage   09/18/2020, 3:12 PM

## 2020-09-18 NOTE — ED Notes (Addendum)
PT family member updated. RN to continue to update family members: [phone]

## 2020-09-18 NOTE — ED Notes (Signed)
Family updated via pt

## 2020-09-18 NOTE — ED Triage Notes (Addendum)
Pt transported via [facility] for c/c of Shob since yesterday. Pt was on 2L as normal. Pt arrived on NRB on 10L but is still labored. Pt is from [facility]. Pt had a fall this weekend where she was seen. Pt dyspnea upon arrival.   104HR, 140/90, 40RR, 96 NRB 10L

## 2020-09-18 NOTE — ED Notes (Signed)
PT placed on 6 L [place], 02 sat 97.

## 2020-09-18 NOTE — ED Provider Notes (Signed)
[facility] EMERGENCY DEPARTMENT Provider Note   CSN: [id number] Arrival date & time: 09/18/20  0744     History Chief Complaint  Patient presents with  . Shortness of Breath    Pt transported via [place] for c/c of [place] since yesterday. Pt was on 2L as normal. Pt arrived on NRB on 10L but is still labored. Pt is from [place]. Pt had a fall this weekend where she was seen. Pt dyspnea upon arrival.   104HR, 140/90, 40RR, 96 NRB 10L      Albert Devaul is a 80 y.o. female.  Patient with hx copd, chf, presents via EMS from SNF for sob. Pt is on home o2. In past day, noted with increased work of breathing/sob appearing. Symptoms acute onset, moderate, persistent, worse today. Patient with hx dementia, limited historian - level 5 caveat. Pt unsure when sob worsened. No chest pain. Occasional non prod cough. No fever. No leg pain or swelling.   The history is provided by the patient and the EMS personnel. The history is limited by the condition of the patient.  Shortness of Breath Associated symptoms: cough   Associated symptoms: no abdominal pain, no chest pain, no fever, no headaches, no neck pain, no rash, no sore throat and no vomiting        Past Medical History:  Diagnosis Date  . Anxiety   . Arthritis   . Cancer [facility])    breast CA 2002  . CHF (congestive heart failure) ([place])   . COPD (chronic obstructive pulmonary disease) ([place])   . Dementia ([place])   . Hypercholesterolemia   . Hyperthyroidism   . Osteopenia     Patient Active Problem List   Diagnosis Date Noted  . COPD (chronic obstructive pulmonary disease) ([place]) 01/30/2016  . Cough 01/30/2016  . Breast cancer of lower-inner quadrant of left female breast ([place]) 01/29/2016  . GI bleed 09/26/2015  . Acute GI bleeding 09/26/2015  . Chest pain 09/26/2015  . Chronic diastolic CHF (congestive heart failure) ([place]) 09/26/2015  . HLD (hyperlipidemia) 09/26/2015  . GI bleeding 09/26/2015  . Acute  pulmonary embolism ([place]) 07/13/2013  . Acute renal failure ([place]) 07/08/2013  . Metabolic acidosis 82/50/5397  . Hypokalemia 07/08/2013  . Anemia 07/08/2013  . History of breast cancer 07/08/2013  . CHF (congestive heart failure) ([place]) 07/08/2013  . Diarrhea 07/08/2013  . Renal failure 07/07/2013    Past Surgical History:  Procedure Laterality Date  . ABDOMINAL HYSTERECTOMY    . APPENDECTOMY    . BACK SURGERY    . BREAST SURGERY    . MASTECTOMY W/ SENTINEL NODE BIOPSY Left 02/26/2016   Procedure: LEFT MASTECTOMY WITH SENTINEL LYMPH NODE BIOPSY;  Surgeon: Coralie Keens, MD;  Location: [facility];  Service: General;  Laterality: Left;     OB History   No obstetric history on file.     Family History  Problem Relation Age of Onset  . Stroke Father   . Diabetes Mellitus I Son   . Leukemia Son     Social History   Tobacco Use  . Smoking status: Former Smoker    Quit date: 02/03/1994    Years since quitting: 26.6  . Smokeless tobacco: Never Used  Substance Use Topics  . Alcohol use: Yes    Comment: 02/04/16 1 glass  wine daily  . Drug use: No    Home Medications Prior to Admission medications   Medication Sig Start Date End Date Taking? Authorizing Provider  anastrozole (ARIMIDEX) 1 MG tablet Take 1 tablet (1 mg total) by mouth daily. 09/11/16   Nicholas Lose, MD  atorvastatin (LIPITOR) 40 MG tablet Take 40 mg by mouth daily.    [provider]  budesonide-formoterol (SYMBICORT) 80-4.5 MCG/ACT inhaler Inhale 2 puffs into the lungs 2 (two) times daily.    [provider]  carvedilol (COREG) 3.125 MG tablet Take 3.125 mg by mouth 2 (two) times daily with a meal.    [provider]  cholecalciferol (VITAMIN D3) 25 MCG (1000 UNIT) tablet Take 2,000 Units by mouth daily.    [provider]  famotidine (PEPCID) 40 MG tablet Take 40 mg by mouth daily.    [provider]  furosemide (LASIX) 20 MG tablet Take 20 mg by mouth daily.  11/14/15    [provider]  gabapentin (NEURONTIN) 100 MG capsule Take 200 mg by mouth 2 (two) times daily.     [provider]  levothyroxine (SYNTHROID, LEVOTHROID) 25 MCG tablet Take 25 mcg by mouth daily before breakfast.  01/23/16   [provider]  lisinopril (PRINIVIL,ZESTRIL) 2.5 MG tablet Take 1 tablet (2.5 mg total) by mouth daily. Patient not taking: Reported on 09/16/2020 07/14/13   Velvet Bathe, MD  memantine (NAMENDA) 10 MG tablet Take 1 tablet (10 mg total) by mouth 2 (two) times daily. 03/17/16   Penumalli, Earlean Polka, MD  mirabegron ER (MYRBETRIQ) 25 MG TB24 tablet Take 25 mg by mouth daily.    [provider]  Multiple Vitamin (MULTIVITAMIN WITH MINERALS) TABS tablet Take 2 tablets by mouth daily.     [provider]  OXYGEN Inhale 2 L into the lungs See admin instructions. 2 Liters per minute during the day as needed for shortness of breath, and 2 liters per minute at night    [provider]  potassium chloride SA (K-DUR,KLOR-CON) 20 MEQ tablet Take 20 mEq by mouth daily.  08/31/15   [provider]  venlafaxine XR (EFFEXOR-XR) 150 MG 24 hr capsule Take 150 mg by mouth daily with breakfast.    [provider]    Allergies    Patient has no known allergies.  Review of Systems   Review of Systems  Constitutional: Negative for chills and fever.  HENT: Negative for sore throat.   Eyes: Negative for redness.  Respiratory: Positive for cough and shortness of breath.   Cardiovascular: Negative for chest pain and leg swelling.  Gastrointestinal: Negative for abdominal pain and vomiting.  Genitourinary: Negative for flank pain.  Musculoskeletal: Negative for back pain and neck pain.  Skin: Negative for rash.  Neurological: Negative for headaches.  Hematological: Does not bruise/bleed easily.  Psychiatric/Behavioral: Positive for confusion.    Physical Exam Updated Vital Signs BP (!) 145/77 (BP Location: Right Arm)    Pulse (!) 102   Temp (!) 97.4 F (36.3 C) (Oral)   Resp (!) 26   Ht 1.575 m (5\' 2" )   Wt 55 kg   SpO2 100%   BMI 22.18 kg/m   Physical Exam Vitals and nursing note reviewed.  Constitutional:      Appearance: Normal appearance. She is well-developed.  HENT:     Head: Atraumatic.     Nose: Nose normal.     Mouth/Throat:     Mouth: Mucous membranes are moist.  Eyes:     General: No scleral icterus.    Conjunctiva/sclera: Conjunctivae normal.     Pupils: Pupils are equal, round, and reactive to light.  Neck:     Vascular: No carotid bruit.     Trachea: No tracheal deviation.  Cardiovascular:     Rate and Rhythm: Normal rate and regular rhythm.     Pulses: Normal pulses.     Heart sounds: Normal heart sounds. No murmur heard. No friction rub. No gallop.   Pulmonary:     Effort: Respiratory distress present.     Breath sounds: Rales present.     Comments: Diminished air movement bilaterally. Mild wheeze. Abdominal:     General: Bowel sounds are normal. There is no distension.     Palpations: Abdomen is soft.     Tenderness: There is no abdominal tenderness.  Genitourinary:    Comments: No cva tenderness.  Musculoskeletal:        General: No swelling or tenderness.     Cervical back: Normal range of motion and neck supple. No rigidity. No muscular tenderness.  Skin:    General: Skin is warm and dry.     Findings: No rash.  Neurological:     Mental Status: She is alert.     Comments: Alert, speech normal.   Psychiatric:        Mood and Affect: Mood normal.     ED Results / Procedures / Treatments   Labs (all labs ordered are listed, but only abnormal results are displayed) Results for orders placed or performed during the hospital encounter of 09/18/20  Resp Panel by RT-PCR (Flu A&B, Covid) Nasopharyngeal Swab   Specimen: Nasopharyngeal Swab; Nasopharyngeal(NP) swabs in vial transport medium  Result Value Ref Range   SARS Coronavirus 2 by RT PCR NEGATIVE  NEGATIVE   Influenza A by PCR NEGATIVE NEGATIVE   Influenza B by PCR NEGATIVE NEGATIVE  CBC  Result Value Ref Range   WBC 47.1 (H) 4.0 - 10.5 K/uL   RBC 4.21 3.87 - 5.11 MIL/uL   Hemoglobin 12.8 12.0 - 15.0 g/dL   HCT 39.5 36.0 - 46.0 %   MCV 93.8 80.0 - 100.0 fL   MCH 30.4 26.0 - 34.0 pg   MCHC 32.4 30.0 - 36.0 g/dL   RDW 13.2 11.5 - 15.5 %   Platelets 410 (H) 150 - 400 K/uL   nRBC 0.0 0.0 - 0.2 %  Brain natriuretic peptide  Result Value Ref Range   B Natriuretic Peptide 296.9 (H) 0.0 - 100.0 pg/mL  Lactic acid, plasma  Result Value Ref Range   Lactic Acid, Venous 1.7 0.5 - 1.9 mmol/L  Comprehensive metabolic panel  Result Value Ref Range   Sodium 135 135 - 145 mmol/L   Potassium 5.6 (H) 3.5 - 5.1 mmol/L   Chloride 100 98 - 111 mmol/L   CO2 18 (L) 22 - 32 mmol/L   Glucose, Bld 104 (H) 70 - 99 mg/dL   BUN 29 (H) 8 - 23 mg/dL   Creatinine, Ser 0.93 0.44 - 1.00 mg/dL   Calcium 9.2 8.9 - 10.3 mg/dL   Total Protein 7.2 6.5 - 8.1 g/dL   Albumin 2.6 (L) 3.5 - 5.0 g/dL   AST PENDING 15 - 41 U/L   ALT 6 0 - 44 U/L   Alkaline Phosphatase 172 (H) 38 - 126 U/L   Total Bilirubin 0.1 (L) 0.3 - 1.2 mg/dL   GFR, Estimated >60 >60 mL/min   Anion gap 17 (H) 5 - 15  Troponin I (High Sensitivity)  Result Value Ref Range   Troponin I (High Sensitivity) 30 (H) <18 ng/L   DG Chest  2 View  Result Date: 09/16/2020 CLINICAL DATA:  Lower right rib pain after a fall EXAM: CHEST - 2 VIEW COMPARISON:  09/27/2015 FINDINGS: Heart size and pulmonary vascularity are normal. Emphysematous changes and scattered fibrosis in the lungs. Calcified granuloma in the right upper lung. Infiltration in the right lower lung posteriorly with small right pleural effusion, likely pneumonia. In the setting of trauma, this could also indicate pulmonary contusion. No displaced rib fractures are identified although rib views were not obtained. IMPRESSION: 1. Emphysematous changes and fibrosis in the lungs. 2. Infiltration  in the right lower lung with small right pleural effusion, likely pneumonia. Electronically Signed   By: Lucienne Capers M.D.   On: 09/16/2020 23:07   CT Head Wo Contrast  Result Date: 09/17/2020 CLINICAL DATA:  Mechanical fall. EXAM: CT HEAD WITHOUT CONTRAST CT CERVICAL SPINE WITHOUT CONTRAST TECHNIQUE: Multidetector CT imaging of the head and cervical spine was performed following the standard protocol without intravenous contrast. Multiplanar CT image reconstructions of the cervical spine were also generated. COMPARISON:  None. FINDINGS: CT HEAD FINDINGS Brain: Diffuse cerebral atrophy. Ventricular dilatation likely due to central atrophy. Low-attenuation changes in the deep white matter consistent with small vessel ischemia. No mass-effect or midline shift. No abnormal extra-axial fluid collections. 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 Basal cisterns are not effaced. No acute intracranial hemorrhage. Vascular: Moderate intracranial arterial vascular calcifications. Skull: The calvarium appears intact. Sinuses/Orbits: Paranasal sinuses and mastoid air cells are clear. Other: None. CT CERVICAL SPINE FINDINGS Alignment: Normal alignment. Skull base and vertebrae: No acute fracture. No primary bone lesion or focal pathologic process. Soft tissues and spinal canal: No prevertebral fluid or swelling. No visible canal hematoma. Disc levels: Degenerative changes with disc space narrowing and endplate hypertrophic changes throughout. Changes are most prominent at C4-5 and C5-6 levels. Prominent disc osteophyte complex at C5-6 level. Upper chest: Emphysematous changes and scarring in the lung apices. Other: None. IMPRESSION: 1. No acute intracranial abnormalities. Chronic atrophy and small vessel ischemic changes. 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 2. Normal alignment of the cervical spine. Degenerative  changes. No acute displaced fractures identified. 3. Emphysematous changes and scarring in the lung apices. Emphysema (ICD10-J43.9). Electronically Signed   By: Lucienne Capers M.D.   On: 09/17/2020 00:29   CT Cervical Spine Wo Contrast  Result Date: 09/17/2020 CLINICAL DATA:  Mechanical fall. EXAM: CT HEAD WITHOUT CONTRAST CT CERVICAL SPINE WITHOUT CONTRAST TECHNIQUE: Multidetector CT imaging of the head and cervical spine was performed following the standard protocol without intravenous contrast. Multiplanar CT image reconstructions of the cervical spine were also generated. COMPARISON:  None. FINDINGS: CT HEAD FINDINGS Brain: Diffuse cerebral atrophy. Ventricular dilatation likely due to central atrophy. Low-attenuation changes in the deep white matter consistent with small vessel ischemia. No mass-effect or midline shift. No abnormal extra-axial fluid collections. 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 Basal cisterns are not effaced. No acute intracranial hemorrhage. Vascular: Moderate intracranial arterial vascular calcifications. Skull: The calvarium appears intact. Sinuses/Orbits: Paranasal sinuses and mastoid air cells are clear. Other: None. CT CERVICAL SPINE FINDINGS Alignment: Normal alignment. Skull base and vertebrae: No acute fracture. No primary bone lesion or focal pathologic process. Soft tissues and spinal canal: No prevertebral fluid or swelling. No visible canal hematoma. Disc levels: Degenerative changes with disc space narrowing and endplate hypertrophic changes throughout. Changes are most prominent at  C4-5 and C5-6 levels. Prominent disc osteophyte complex at C5-6 level. Upper chest: Emphysematous changes and scarring in the lung apices. Other: None. IMPRESSION: 1. No acute intracranial abnormalities. Chronic atrophy and small vessel ischemic changes. Focal encephalomalacia in the left anterior frontal region, likely old  infarct. 2. Normal alignment of the cervical spine. Degenerative changes. No acute displaced fractures identified. 3. Emphysematous changes and scarring in the lung apices. Emphysema (ICD10-J43.9). Electronically Signed   By: Lucienne Capers M.D.   On: 09/17/2020 00:29    EKG EKG Interpretation  Date/Time:  Tuesday September 18 2020 07:53:56 EDT Ventricular Rate:  103 PR Interval:  155 QRS Duration: 74 QT Interval:  323 QTC Calculation: 423 R Axis:   77 Text Interpretation: Sinus tachycardia Borderline repolarization abnormality Non-specific ST-t changes Confirmed by Lajean Saver [phone]) on 09/18/2020 8:00:33 AM   Radiology DG Chest 2 View  Result Date: 09/16/2020 CLINICAL DATA:  Lower right rib pain after a fall EXAM: CHEST - 2 VIEW COMPARISON:  09/27/2015 FINDINGS: Heart size and pulmonary vascularity are normal. Emphysematous changes and scattered fibrosis in the lungs. Calcified granuloma in the right upper lung. Infiltration in the right lower lung posteriorly with small right pleural effusion, likely pneumonia. In the setting of trauma, this could also indicate pulmonary contusion. No displaced rib fractures are identified although rib views were not obtained. IMPRESSION: 1. Emphysematous changes and fibrosis in the lungs. 2. Infiltration in the right lower lung with small right pleural effusion, likely pneumonia. Electronically Signed   By: Lucienne Capers M.D.   On: 09/16/2020 23:07   CT Head Wo Contrast  Result Date: 09/17/2020 CLINICAL DATA:  Mechanical fall. EXAM: CT HEAD WITHOUT CONTRAST CT CERVICAL SPINE WITHOUT CONTRAST TECHNIQUE: Multidetector CT imaging of the head and cervical spine was performed following the standard protocol without intravenous contrast. Multiplanar CT image reconstructions of the cervical spine were also generated. COMPARISON:  None. FINDINGS: CT HEAD FINDINGS Brain: Diffuse cerebral atrophy. Ventricular dilatation likely due to central atrophy.  Low-attenuation changes in the deep white matter consistent with small vessel ischemia. No mass-effect or midline shift. No abnormal extra-axial fluid collections. 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 Basal cisterns are not effaced. No acute intracranial hemorrhage. Vascular: Moderate intracranial arterial vascular calcifications. Skull: The calvarium appears intact. Sinuses/Orbits: Paranasal sinuses and mastoid air cells are clear. Other: None. CT CERVICAL SPINE FINDINGS Alignment: Normal alignment. Skull base and vertebrae: No acute fracture. No primary bone lesion or focal pathologic process. Soft tissues and spinal canal: No prevertebral fluid or swelling. No visible canal hematoma. Disc levels: Degenerative changes with disc space narrowing and endplate hypertrophic changes throughout. Changes are most prominent at C4-5 and C5-6 levels. Prominent disc osteophyte complex at C5-6 level. Upper chest: Emphysematous changes and scarring in the lung apices. Other: None. IMPRESSION: 1. No acute intracranial abnormalities. Chronic atrophy and small vessel ischemic changes. 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 2. Normal alignment of the cervical spine. Degenerative changes. No acute displaced fractures identified. 3. Emphysematous changes and scarring in the lung apices. Emphysema (ICD10-J43.9). Electronically Signed   By: Lucienne Capers M.D.   On: 09/17/2020 00:29   CT Cervical Spine Wo Contrast  Result Date: 09/17/2020 CLINICAL DATA:  Mechanical fall. EXAM: CT HEAD WITHOUT CONTRAST CT CERVICAL SPINE WITHOUT CONTRAST TECHNIQUE: Multidetector CT imaging of the head and cervical spine was performed following the standard protocol without intravenous contrast. Multiplanar CT image  reconstructions of the cervical spine were also generated. COMPARISON:  None. FINDINGS: CT HEAD FINDINGS Brain: Diffuse cerebral  atrophy. Ventricular dilatation likely due to central atrophy. Low-attenuation changes in the deep white matter consistent with small vessel ischemia. No mass-effect or midline shift. No abnormal extra-axial fluid collections. 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 Basal cisterns are not effaced. No acute intracranial hemorrhage. Vascular: Moderate intracranial arterial vascular calcifications. Skull: The calvarium appears intact. Sinuses/Orbits: Paranasal sinuses and mastoid air cells are clear. Other: None. CT CERVICAL SPINE FINDINGS Alignment: Normal alignment. Skull base and vertebrae: No acute fracture. No primary bone lesion or focal pathologic process. Soft tissues and spinal canal: No prevertebral fluid or swelling. No visible canal hematoma. Disc levels: Degenerative changes with disc space narrowing and endplate hypertrophic changes throughout. Changes are most prominent at C4-5 and C5-6 levels. Prominent disc osteophyte complex at C5-6 level. Upper chest: Emphysematous changes and scarring in the lung apices. Other: None. IMPRESSION: 1. No acute intracranial abnormalities. Chronic atrophy and small vessel ischemic changes. 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 2. Normal alignment of the cervical spine. Degenerative changes. No acute displaced fractures identified. 3. Emphysematous changes and scarring in the lung apices. Emphysema (ICD10-J43.9). Electronically Signed   By: Lucienne Capers M.D.   On: 09/17/2020 00:29   DG Chest Port 1 View  Result Date: 09/18/2020 CLINICAL DATA:  Shortness of breath. EXAM: PORTABLE CHEST 1 VIEW COMPARISON:  09/16/2020. FINDINGS: Heart size normal. Right base infiltrate with moderate right pleural effusion. Calcified nodule right upper lung again noted consistent calcified granuloma. No pneumothorax. No acute bony abnormality. Splenic calcifications consistent  granulomas again noted. IMPRESSION: Right base infiltrate with moderate right pleural effusion. Electronically Signed   By: Marcello Moores  Register   On: 09/18/2020 08:21    Procedures Procedures   Medications Ordered in ED Medications  albuterol (PROVENTIL) (2.5 MG/3ML) 0.083% nebulizer solution 5 mg (has no administration in time range)  ipratropium (ATROVENT) nebulizer solution 0.5 mg (has no administration in time range)  methylPREDNISolone sodium succinate (SOLU-MEDROL) 125 mg/2 mL injection 125 mg (has no administration in time range)    ED Course  I have reviewed the triage vital signs and the nursing notes.  Pertinent labs & imaging results that were available during my care of the patient were reviewed by me and considered in my medical decision making (see chart for details).    MDM Rules/Calculators/A&P                         Iv ns. Continuous pulse ox and cardiac monitoring. Stat labs and imaging. Ecg.   Reviewed nursing notes and prior charts for additional history.   Solumedrol iv. Albuterol and atrovent breathing treatment.   Labs reviewed/interpreted by me - bun mildly increased ?mild dehydration. k sl high. Iv ns bolus. Monitor, ecg.   CXR reviewed/interpreted by me - possible pna RLL.  Lactate checked - normal. Cultures sent. Iv abx given.   Additional alb/atrovent tx.   Improved air movement compared to initial.  Given dyspnea, weakness, pna on cxr - will admit.      Final Clinical Impression(s) / ED Diagnoses Final diagnoses:  None    Rx / DC Orders ED Discharge Orders    None       Lajean Saver, MD 09/18/20 1334

## 2020-09-19 ENCOUNTER — Inpatient Hospital Stay (HOSPITAL_COMMUNITY): Payer: Medicare Other

## 2020-09-19 ENCOUNTER — Encounter (HOSPITAL_COMMUNITY): Payer: Self-pay | Admitting: Internal Medicine

## 2020-09-19 DIAGNOSIS — F0390 Unspecified dementia without behavioral disturbance: Secondary | ICD-10-CM

## 2020-09-19 DIAGNOSIS — I5032 Chronic diastolic (congestive) heart failure: Secondary | ICD-10-CM | POA: Diagnosis not present

## 2020-09-19 DIAGNOSIS — I5022 Chronic systolic (congestive) heart failure: Secondary | ICD-10-CM | POA: Diagnosis not present

## 2020-09-19 DIAGNOSIS — F0391 Unspecified dementia with behavioral disturbance: Secondary | ICD-10-CM | POA: Diagnosis not present

## 2020-09-19 DIAGNOSIS — J449 Chronic obstructive pulmonary disease, unspecified: Secondary | ICD-10-CM | POA: Diagnosis not present

## 2020-09-19 DIAGNOSIS — A419 Sepsis, unspecified organism: Secondary | ICD-10-CM | POA: Diagnosis not present

## 2020-09-19 DIAGNOSIS — J869 Pyothorax without fistula: Secondary | ICD-10-CM | POA: Diagnosis not present

## 2020-09-19 DIAGNOSIS — J441 Chronic obstructive pulmonary disease with (acute) exacerbation: Secondary | ICD-10-CM | POA: Diagnosis not present

## 2020-09-19 HISTORY — PX: IR PERC PLEURAL DRAIN W/INDWELL CATH W/IMG GUIDE: IMG5383

## 2020-09-19 LAB — BODY FLUID CELL COUNT WITH DIFFERENTIAL
Eos, Fluid: 0 %
Lymphs, Fluid: 5 %
Monocyte-Macrophage-Serous Fluid: 6 % — ABNORMAL LOW (ref 50–90)
Neutrophil Count, Fluid: 89 % — ABNORMAL HIGH (ref 0–25)
Total Nucleated Cell Count, Fluid: 1265 cu mm — ABNORMAL HIGH (ref 0–1000)

## 2020-09-19 LAB — HIV ANTIBODY (ROUTINE TESTING W REFLEX): HIV Screen 4th Generation wRfx: NONREACTIVE

## 2020-09-19 LAB — CBC WITH DIFFERENTIAL/PLATELET
Abs Immature Granulocytes: 0.57 10*3/uL — ABNORMAL HIGH (ref 0.00–0.07)
Basophils Absolute: 0.1 10*3/uL (ref 0.0–0.1)
Basophils Relative: 0 %
Eosinophils Absolute: 0 10*3/uL (ref 0.0–0.5)
Eosinophils Relative: 0 %
HCT: 35.8 % — ABNORMAL LOW (ref 36.0–46.0)
Hemoglobin: 11.8 g/dL — ABNORMAL LOW (ref 12.0–15.0)
Immature Granulocytes: 1 %
Lymphocytes Relative: 2 %
Lymphs Abs: 0.8 10*3/uL (ref 0.7–4.0)
MCH: 29.6 pg (ref 26.0–34.0)
MCHC: 33 g/dL (ref 30.0–36.0)
MCV: 89.7 fL (ref 80.0–100.0)
Monocytes Absolute: 1.9 10*3/uL — ABNORMAL HIGH (ref 0.1–1.0)
Monocytes Relative: 5 %
Neutro Abs: 36.5 10*3/uL — ABNORMAL HIGH (ref 1.7–7.7)
Neutrophils Relative %: 92 %
Platelets: 400 10*3/uL (ref 150–400)
RBC: 3.99 MIL/uL (ref 3.87–5.11)
RDW: 13 % (ref 11.5–15.5)
WBC: 39.8 10*3/uL — ABNORMAL HIGH (ref 4.0–10.5)
nRBC: 0 % (ref 0.0–0.2)

## 2020-09-19 LAB — BASIC METABOLIC PANEL
Anion gap: 9 (ref 5–15)
BUN: 25 mg/dL — ABNORMAL HIGH (ref 8–23)
CO2: 23 mmol/L (ref 22–32)
Calcium: 8.7 mg/dL — ABNORMAL LOW (ref 8.9–10.3)
Chloride: 107 mmol/L (ref 98–111)
Creatinine, Ser: 0.75 mg/dL (ref 0.44–1.00)
GFR, Estimated: >60 mL/min (ref >60)
Glucose, Bld: 139 mg/dL — ABNORMAL HIGH (ref 70–99)
Potassium: 3.8 mmol/L (ref 3.5–5.1)
Sodium: 139 mmol/L (ref 135–145)

## 2020-09-19 LAB — PREALBUMIN: Prealbumin: <5 mg/dL — ABNORMAL LOW (ref 18–38)

## 2020-09-19 LAB — PROTEIN, PLEURAL OR PERITONEAL FLUID: Total protein, fluid: 3.9 g/dL

## 2020-09-19 LAB — LACTATE DEHYDROGENASE: LDH: 152 U/L (ref 98–192)

## 2020-09-19 MED ORDER — ONDANSETRON HCL 4 MG/2ML IJ SOLN: 4.0000 mg | Freq: Four times a day (QID) | INTRAMUSCULAR | Status: DC | PRN

## 2020-09-19 MED ORDER — ACETAMINOPHEN 325 MG PO TABS
650.0000 mg | ORAL_TABLET | Freq: Four times a day (QID) | ORAL | Status: DC | PRN
  Administered 2020-09-20: 650 mg via ORAL
  Filled 2020-09-19: qty 2

## 2020-09-19 MED ORDER — MORPHINE SULFATE (PF) 2 MG/ML IV SOLN
2.0000 mg | INTRAVENOUS | Status: DC | PRN
  Administered 2020-09-19 – 2020-09-24 (×9): 2 mg via INTRAVENOUS
  Filled 2020-09-19 (×9): qty 1

## 2020-09-19 MED ORDER — FENTANYL CITRATE (PF) 100 MCG/2ML IJ SOLN
INTRAMUSCULAR | Status: AC | PRN
  Administered 2020-09-19: 25 ug via INTRAVENOUS

## 2020-09-19 MED ORDER — MORPHINE SULFATE (PF) 2 MG/ML IV SOLN
1.0000 mg | Freq: Once | INTRAVENOUS | Status: AC
  Administered 2020-09-19: 1 mg via INTRAVENOUS
  Filled 2020-09-19: qty 1

## 2020-09-19 MED ORDER — FENTANYL CITRATE (PF) 100 MCG/2ML IJ SOLN
INTRAMUSCULAR | Status: AC
  Filled 2020-09-19: qty 2

## 2020-09-19 MED ORDER — LIDOCAINE HCL 1 % IJ SOLN
INTRAMUSCULAR | Status: AC
  Filled 2020-09-19: qty 20

## 2020-09-19 MED ORDER — OXYCODONE HCL 5 MG PO TABS
5.0000 mg | ORAL_TABLET | Freq: Four times a day (QID) | ORAL | Status: DC | PRN
  Administered 2020-09-19 – 2020-09-20 (×5): 5 mg via ORAL
  Filled 2020-09-19 (×6): qty 1

## 2020-09-19 MED ORDER — FUROSEMIDE 10 MG/ML IJ SOLN
20.0000 mg | Freq: Once | INTRAMUSCULAR | Status: AC
  Administered 2020-09-19: 20 mg via INTRAVENOUS
  Filled 2020-09-19: qty 2

## 2020-09-19 NOTE — Progress Notes (Signed)
Modified Barium Swallow Progress Note  Patient Details  Name: [name] MRN: [id number] Date of Birth: 03/26/1941  Today's Date: 09/19/2020  Modified Barium Swallow completed.  Full report located under Chart Review in the Imaging Section.  Brief recommendations include the following:  Clinical Impression  Pt presents with a pharyngeal more than oral dysphagia with decreased airway protection across all liquids. She has reduced anterior hyoid movement, base of tongue retraction, and epiglottic inversion that results in airway invasion during the swallow, but also leaves increasing amounts of pharyngeal residue as boluses become thicker. Thin liquids are aspirated silently, and cannot be cleared with a cued cough. A chin tuck does NOT prevent aspiration. With nectar thick liquids she penetrates above the vocal folds under certain conditions, such as when she takes straw sips, large volumes via cup, or if her valleculae is full of nectar-thick residue that spills over the epiglottis as she is working on more prolonged mastication of solids. This penetration clears more readily with a cued cough or throat clear, and the remaining cup sips are consumed without entering the airway. Honey thick liquids are aspirated, perhaps in part due to the posterior head tilt that she uses to access them from the cup. Recommend starting wtih Dys 2 (finely chopped) foods and nectar thick liquids by cup. Would encourage small, single sips and intermittent throat clears, although anticipate that pt will need assistance for reinforcement.   Swallow Evaluation Recommendations       SLP Diet Recommendations: Dysphagia 2 (Fine chop) solids;Nectar thick liquid   Liquid Administration via: Cup;No straw   Medication Administration: Crushed with puree   Supervision: Patient able to self feed;Full supervision/cueing for compensatory strategies   Compensations: Minimize environmental distractions;Slow rate;Small  sips/bites;Clear throat intermittently   Postural Changes: Remain semi-upright after after feeds/meals (Comment);Seated upright at 90 degrees   Oral Care Recommendations: Oral care BID   Other Recommendations: Order thickener from pharmacy;Prohibited food (jello, ice cream, thin soups);Remove water pitcher    Osie Bond., M.A. [place] Pager [phone] Office [phone]  09/19/2020,3:01 PM

## 2020-09-19 NOTE — Progress Notes (Addendum)
[facility] PROGRESS NOTE   [name] ZOX:[id number] DOB: August 01, 1940 DOA: 09/18/2020  PCP: [name], MD  Brief History/Interval Summary: 80 y.o. female with medical history significant of COPD,  chronic systolic CHF 81-19%, hypothyroidism, breast cancer s/p left mastectomy presents from [facility] due to shortness of breath.    Patient with history of dementia and so history was limited.  Supposed to be on home oxygen at 2 L/min.  Presented with shortness of breath.  Was found to have a loculated right-sided pleural effusion.  Hospitalized for further management.    Consultants: Cardiothoracic surgery.  Interventional radiology  Procedures: None yet  Antibiotics: Anti-infectives (From admission, onward)   Start     Dose/Rate Route Frequency Ordered Stop   09/19/20 1100  vancomycin (VANCOREADY) IVPB 750 mg/150 mL        750 mg 150 mL/hr over 60 Minutes Intravenous Every 24 hours 09/18/20 1255     09/18/20 1600  cefTRIAXone (ROCEPHIN) 2 g in sodium chloride 0.9 % 100 mL IVPB        2 g 200 mL/hr over 30 Minutes Intravenous Every 24 hours 09/18/20 1526 09/23/20 1559   09/18/20 1600  azithromycin (ZITHROMAX) 500 mg in sodium chloride 0.9 % 250 mL IVPB        500 mg 250 mL/hr over 60 Minutes Intravenous Every 24 hours 09/18/20 1526 09/23/20 1559   09/18/20 0945  vancomycin (VANCOREADY) IVPB 1250 mg/250 mL        1,250 mg 166.7 mL/hr over 90 Minutes Intravenous  Once 09/18/20 0932 09/18/20 1334   09/18/20 0930  ceFEPIme (MAXIPIME) 2 g in sodium chloride 0.9 % 100 mL IVPB        2 g 200 mL/hr over 30 Minutes Intravenous  Once 09/18/20 0926 09/18/20 1043      Subjective/Interval History: Patient pleasantly confused.  This morning when she was mobilized she became short of breath and started complaining of pain in the chest area.  Currently she denies any chest pain.    Assessment/Plan:  Loculated right-sided pleural effusion/sepsis present on admission/acute  respiratory failure with hypoxia Apparently has had some coughing episodes with eating and drinking raising concern for aspiration.  Due to loculated effusion cardiothoracic surgery was consulted.  Seen by Dr. Cyndia Bent this morning who feels that the patient is not a surgical candidate.  He recommends evaluation by interventional radiology. Patient remains on broad-spectrum antibiotics with vancomycin ceftriaxone and azithromycin. Lactic acid level was 1.7.  WBC was noted to be significantly elevated at 47.1.  Noted to be 39.8 this morning. We will check procalcitonin.  Follow-up on blood cultures.  Speech therapy is following.  Leukocytosis/normocytic anemia Some of this likely is reactive.  But likely also due to acute infection.  LDH is normal.  Chest pain and dyspnea This morning she got acutely short of breath.  This was after she was mobilized. EKG was done which did not show any acute ischemic changes.  Chest x-ray did not show any new findings compared to the one done yesterday.  Patient does not have any chest discomfort this morning and seems to be stable and comfortable.  D-dimer is noted to be elevated which could be elevated due to sepsis itself.  CT scan done yesterday was done without contrast.  No clear indication to repeating a CT study.  If she has persistent symptoms then we may consider this.  COPD with acute exacerbation Noted to be wheezing yesterday.  Was given steroids and  nebulizer treatments.  Lungs sound better today with no wheezing.  Mildly elevated troponin Likely due to demand ischemia.  EKG without any acute changes.  Chronic systolic CHF Last echocardiogram showed a EF of 40 to 45%.  Does not appear to be volume overloaded.  However due to her symptoms this morning she was given a small dose of Lasix.  Continue to monitor for now.  Essential hypertension Continue home medications.  Monitor blood pressures.  Hyperkalemia Seems to be resolved this morning with a  normal potassium of 3.8.  History of dementia Patient apparently has significant dementia per family.  Reorient daily.  Delirium precautions.  Hypothyroidism TSH noted to be normal.  Continue levothyroxine.  History of breast cancer Seems to be stable.  Noted to be on anastrozole.  History of GERD Continue Pepcid.  DVT Prophylaxis: Lovenox Code Status: DNR Family Communication: No family at bedside.  Phone conversation with patient's son.  CODE STATUS was discussed.  Based on patient's previously known wishes he agrees with changing to DNR.  Also agreeable for patient to undergo procedures to help drain the fluid collection which might help her symptoms. Disposition Plan: Hopefully return home when improved  Status is: Inpatient  Remains inpatient appropriate because:IV treatments appropriate due to intensity of illness or inability to take PO and Inpatient level of care appropriate due to severity of illness   Dispo: The patient is from: Home              Anticipated d/c is to: Home              Patient currently is not medically stable to d/c.   Difficult to place patient No       Medications:  Scheduled: . anastrozole  1 mg Oral Daily  . arformoterol  15 mcg Nebulization BID  . atorvastatin  40 mg Oral QHS  . budesonide (PULMICORT) nebulizer solution  0.5 mg Nebulization BID  . carvedilol  3.125 mg Oral BID WC  . enoxaparin (LOVENOX) injection  40 mg Subcutaneous Q24H  . famotidine  40 mg Oral QHS  . gabapentin  200 mg Oral BID  . ipratropium-albuterol  3 mL Nebulization QID  . levothyroxine  25 mcg Oral QAC breakfast  . memantine  10 mg Oral BID  . mirabegron ER  25 mg Oral Daily  . venlafaxine XR  150 mg Oral Q breakfast   Continuous: . azithromycin Stopped (09/18/20 2018)  . cefTRIAXone (ROCEPHIN)  IV Stopped (09/18/20 1819)  . vancomycin 750 mg (09/19/20 1127)   WUX:[id number], albuterol, morphine injection, ondansetron (ZOFRAN) IV,  oxyCODONE   Objective:  Vital Signs  Vitals:   09/19/20 0740 09/19/20 0813 09/19/20 1146 09/19/20 1158  BP: 140/79  140/70   Pulse: (!) 104  95   Resp: (!) 22  19   Temp: 98.3 F (36.8 C)  98.4 F (36.9 C)   TempSrc: Oral  Oral   SpO2: 92% 93% 95% 95%  Weight:      Height:        Intake/Output Summary (Last 24 hours) at 09/19/2020 1241 Last data filed at 09/19/2020 0740 Gross per 24 hour  Intake 1553.44 ml  Output 100 ml  Net 1453.44 ml   Filed Weights   09/18/20 0756 09/19/20 0046  Weight: 55 kg 47.5 kg    General appearance: Awake alert.  In no distress.  Pleasantly confused Resp: Diminished air entry right base with crackles.  Rhonchi.  Mildly tachypneic at rest. Cardio:  S1-S2 is normal regular.  No S3-S4.  No rubs murmurs or bruit GI: Abdomen is soft.  Nontender nondistended.  Bowel sounds are present normal.  No masses organomegaly Extremities: No edema.  Full range of motion of lower extremities. Neurologic:   No focal neurological deficits.    Lab Results:  Data Reviewed: I have personally reviewed following labs and imaging studies  CBC: Recent Labs  Lab 09/18/20 0800 09/19/20 0729  WBC 47.1* 39.8*  NEUTROABS  --  36.5*  HGB 12.8 11.8*  HCT 39.5 35.8*  MCV 93.8 89.7  PLT 410* 169    Basic Metabolic Panel: Recent Labs  Lab 09/18/20 0945 09/19/20 0729  NA 135 139  K 5.6* 3.8  CL 100 107  CO2 18* 23  GLUCOSE 104* 139*  BUN 29* 25*  CREATININE 0.93 0.75  CALCIUM 9.2 8.7*    GFR: Estimated Creatinine Clearance: 42.8 mL/min (by C-G formula based on SCr of 0.75 mg/dL).  Liver Function Tests: Recent Labs  Lab 09/18/20 0945  AST QUANTITY NOT SUFFICIENT, UNABLE TO PERFORM TEST  ALT 6  ALKPHOS 172*  BILITOT 0.1*  PROT 7.2  ALBUMIN 2.6*     Thyroid Function Tests: Recent Labs    09/18/20 2147  TSH 1.077     Recent Results (from the past 240 hour(s))  Resp Panel by RT-PCR (Flu A&B, Covid) Nasopharyngeal Swab     Status: None    Collection Time: 09/18/20  8:00 AM   Specimen: Nasopharyngeal Swab; Nasopharyngeal(NP) swabs in vial transport medium  Result Value Ref Range Status   SARS Coronavirus 2 by RT PCR NEGATIVE NEGATIVE Final    Comment: (NOTE) SARS-CoV-2 target nucleic acids are NOT DETECTED.  The SARS-CoV-2 RNA is generally detectable in upper respiratory specimens during the acute phase of infection. The lowest concentration of SARS-CoV-2 viral copies this assay can detect is 138 copies/mL. A negative result does not preclude SARS-Cov-2 infection and should not be used as the sole basis for treatment or other patient management decisions. A negative result may occur with  improper specimen collection/handling, submission of specimen other than nasopharyngeal swab, presence of viral mutation(s) within the areas targeted by this assay, and inadequate number of viral copies(<138 copies/mL). A negative result must be combined with clinical observations, patient history, and epidemiological information. The expected result is Negative.  Fact Sheet for Patients:  EntrepreneurPulse.com.au  Fact Sheet for Healthcare Providers:  IncredibleEmployment.be  This test is no t yet approved or cleared by the [place] FDA and  has been authorized for detection and/or diagnosis of SARS-CoV-2 by FDA under an Emergency Use Authorization (EUA). This EUA will remain  in effect (meaning this test can be used) for the duration of the COVID-19 declaration under Section 564(b)(1) of the Act, 21 U.S.C.section 360bbb-3(b)(1), unless the authorization is terminated  or revoked sooner.       Influenza A by PCR NEGATIVE NEGATIVE Final   Influenza B by PCR NEGATIVE NEGATIVE Final    Comment: (NOTE) The Xpert Xpress SARS-CoV-2/FLU/RSV plus assay is intended as an aid in the diagnosis of influenza from Nasopharyngeal swab specimens and should not be used as a sole basis for treatment.  Nasal washings and aspirates are unacceptable for Xpert Xpress SARS-CoV-2/FLU/RSV testing.  Fact Sheet for Patients: EntrepreneurPulse.com.au  Fact Sheet for Healthcare Providers: IncredibleEmployment.be  This test is not yet approved or cleared by the [place] FDA and has been authorized for detection and/or diagnosis of SARS-CoV-2 by FDA under an Emergency  Use Authorization (EUA). This EUA will remain in effect (meaning this test can be used) for the duration of the COVID-19 declaration under Section 564(b)(1) of the Act, 21 U.S.C. section 360bbb-3(b)(1), unless the authorization is terminated or revoked.  Performed at [facility], [place] [address], [place], [place] [address]   Blood culture (routine x 2)     Status: None (Preliminary result)   Collection Time: 09/18/20  9:22 AM   Specimen: BLOOD  Result Value Ref Range Status   Specimen Description BLOOD BLOOD RIGHT FOREARM  Final   Special Requests   Final    BOTTLES DRAWN AEROBIC AND ANAEROBIC Blood Culture results may not be optimal due to an inadequate volume of blood received in culture bottles   Culture   Final    NO GROWTH < 24 HOURS Performed at [facility], [place] [address] [place], [place] [address]    Report Status PENDING  Incomplete  Blood culture (routine x 2)     Status: None (Preliminary result)   Collection Time: 09/18/20  9:27 AM   Specimen: BLOOD  Result Value Ref Range Status   Specimen Description BLOOD RIGHT ANTECUBITAL  Final   Special Requests   Final    BOTTLES DRAWN AEROBIC AND ANAEROBIC Blood Culture results may not be optimal due to an inadequate volume of blood received in culture bottles   Culture   Final    NO GROWTH < 24 HOURS Performed at [facility], [place] [address] [place], [place] [address]    Report Status PENDING  Incomplete      Radiology Studies: CT CHEST WO CONTRAST  Result Date: 09/18/2020 CLINICAL DATA:  Abnormal  chest radiograph, pleural effusion EXAM: CT CHEST WITHOUT CONTRAST TECHNIQUE: Multidetector CT imaging of the chest was performed following the standard protocol without IV contrast. COMPARISON:  Chest radiograph dated 09/18/2020. CTA chest dated 09/27/2015. FINDINGS: Cardiovascular: The heart is top-normal in size. No pericardial effusion. No evidence of thoracic aortic aneurysm. Atherosclerotic calcifications of the aortic arch. Three vessel coronary atherosclerosis. Mediastinum/Nodes: Small mediastinal lymph nodes, likely reactive. Visualized thyroid is grossly unremarkable. Lungs/Pleura: Biapical pleural-parenchymal scarring. Moderate centrilobular emphysematous changes, upper lung predominant. Moderate right pleural effusion, loculated. Associated right lower lobe compressive atelectasis. No focal consolidation. No suspicious pulmonary nodules. No pneumothorax. Upper Abdomen: Visualized upper abdomen is notable for motion degradation, calcified splenic granulomata, and vascular calcifications. Musculoskeletal: Mild degenerative changes of the visualized thoracolumbar spine. IMPRESSION: Moderate right pleural effusion, loculated. Associated right lower lobe compressive atelectasis. Aortic Atherosclerosis (ICD10-I70.0) and Emphysema (ICD10-J43.9). Electronically Signed   By: Julian Hy M.D.   On: 09/18/2020 15:56   DG CHEST PORT 1 VIEW  Result Date: 09/19/2020 CLINICAL DATA:  Shortness of breath, dyspnea. EXAM: PORTABLE CHEST 1 VIEW COMPARISON:  September 18, 2020. FINDINGS: The heart size and mediastinal contours are within normal limits. No pneumothorax is noted. Left lung is clear. Continued presence of loculated right pleural effusion is noted with associated right basilar atelectasis or infiltrate. The visualized skeletal structures are unremarkable. IMPRESSION: Continued presence of loculated right pleural effusion with associated right basilar atelectasis or infiltrate. Electronically Signed   By:  Marijo Conception M.D.   On: 09/19/2020 08:11   DG Chest Port 1 View  Result Date: 09/18/2020 CLINICAL DATA:  Shortness of breath. EXAM: PORTABLE CHEST 1 VIEW COMPARISON:  09/16/2020. FINDINGS: Heart size normal. Right base infiltrate with moderate right pleural effusion. Calcified nodule right upper lung again noted consistent  calcified granuloma. No pneumothorax. No acute bony abnormality. Splenic calcifications consistent granulomas again noted. IMPRESSION: Right base infiltrate with moderate right pleural effusion. Electronically Signed   By: Marcello Moores  Register   On: 09/18/2020 08:21       LOS: 1 day   [facility] Pager on [email or web address]  09/19/2020, 12:41 PM

## 2020-09-19 NOTE — Consult Note (Signed)
[address]       [place] [address]             [phone]      Cardiothoracic Surgery Consultation  Reason for Consult: Right empyema Referring Physician: Dr. Fuller Plan  [name] is an 80 y.o. female.  HPI:   The patient is a 80 year old woman with history of COPD, systolic congestive heart failure, hypothyroidism, breast cancer status post left mastectomy, and dementia who was admitted from [facility] due to shortness of breath.  The patient is usually on 2 L nasal cannula oxygen at night but reportedly had increased work of breathing over the previous couple days with intermittent nonproductive cough.  Chest x-ray showed a right base infiltrate with moderate right pleural effusion.  CT scan of the chest showed a moderate size loculated right pleural effusion with right lower lobe compressive atelectasis.  The patient had a white blood cell count of 47.1 on admission, decreased to 39.8 this morning.  She has been afebrile since presentation.  Past Medical History:  Diagnosis Date  . Anxiety   . Arthritis   . Cancer [facility])    breast CA 2002  . CHF (congestive heart failure) ([place])   . COPD (chronic obstructive pulmonary disease) ([place])   . Dementia ([facility])   . Hypercholesterolemia   . Hyperthyroidism   . Osteopenia     Past Surgical History:  Procedure Laterality Date  . ABDOMINAL HYSTERECTOMY    . APPENDECTOMY    . BACK SURGERY    . BREAST SURGERY    . MASTECTOMY W/ SENTINEL NODE BIOPSY Left 02/26/2016   Procedure: LEFT MASTECTOMY WITH SENTINEL LYMPH NODE BIOPSY;  Surgeon: Coralie Keens, MD;  Location: [facility];  Service: General;  Laterality: Left;    Family History  Problem Relation Age of Onset  . Stroke Father   . Diabetes Mellitus I Son   . Leukemia Son     Social History:  reports that she quit smoking about 26 years ago. She has never used smokeless tobacco. She reports current alcohol use. She reports that she does not use  drugs.  Allergies: No Known Allergies  Medications:  I have reviewed the patient's current medications. Prior to Admission:  Medications Prior to Admission  Medication Sig Dispense Refill Last Dose  . acetaminophen (TYLENOL) 650 MG CR tablet Take 650 mg by mouth in the morning and at bedtime.   09/17/2020 at 2000  . anastrozole (ARIMIDEX) 1 MG tablet Take 1 tablet (1 mg total) by mouth daily. 90 tablet 3 09/17/2020 at 0800  . atorvastatin (LIPITOR) 40 MG tablet Take 40 mg by mouth at bedtime.   09/17/2020 at 2000  . budesonide-formoterol (SYMBICORT) 80-4.5 MCG/ACT inhaler Inhale 2 puffs into the lungs 2 (two) times daily.   09/17/2020 at 2000  . Calcium Citrate-Vitamin D (CALCIUM CITRATE PETITE/VIT D PO) Take 1 tablet by mouth in the morning.   09/17/2020 at 0800  . carvedilol (COREG) 3.125 MG tablet Take 3.125 mg by mouth 2 (two) times daily with a meal.   09/17/2020 at 2000  . Cholecalciferol (VITAMIN D3) 50 MCG (2000 UT) TABS Take 2,000 Units by mouth in the morning.   09/17/2020 at 0800  . famotidine (PEPCID) 40 MG tablet Take 40 mg by mouth at bedtime.   09/17/2020 at 2000  . furosemide (LASIX) 20 MG tablet Take 20 mg by mouth in the morning.   09/17/2020 at 0800  . gabapentin (  NEURONTIN) 100 MG capsule Take 200 mg by mouth 2 (two) times daily.    09/17/2020 at 2000  . levothyroxine (SYNTHROID, LEVOTHROID) 25 MCG tablet Take 25 mcg by mouth daily before breakfast.    09/17/2020 at 0800  . memantine (NAMENDA) 10 MG tablet Take 1 tablet (10 mg total) by mouth 2 (two) times daily. 60 tablet 12 09/17/2020 at 2000  . mirabegron ER (MYRBETRIQ) 25 MG TB24 tablet Take 25 mg by mouth daily.   09/17/2020 at 0800  . Multiple Vitamin (MULTIVITAMIN WITH MINERALS) TABS tablet Take 1 tablet by mouth daily.   09/17/2020 at 0800  . OXYGEN Inhale 2 L/min into the lungs See admin instructions. 2 L/min at bedtime and an additional 2 L/min as needed for shortness of breath   09/17/2020 at pm  . potassium chloride SA  (K-DUR,KLOR-CON) 20 MEQ tablet Take 20 mEq by mouth daily.    09/17/2020 at 0800  . venlafaxine XR (EFFEXOR-XR) 150 MG 24 hr capsule Take 150 mg by mouth daily with breakfast.   09/17/2020 at 0800   Scheduled: . anastrozole  1 mg Oral Daily  . arformoterol  15 mcg Nebulization BID  . atorvastatin  40 mg Oral QHS  . budesonide (PULMICORT) nebulizer solution  0.5 mg Nebulization BID  . carvedilol  3.125 mg Oral BID WC  . enoxaparin (LOVENOX) injection  40 mg Subcutaneous Q24H  . famotidine  40 mg Oral QHS  . gabapentin  200 mg Oral BID  . ipratropium-albuterol  3 mL Nebulization QID  . levothyroxine  25 mcg Oral QAC breakfast  . memantine  10 mg Oral BID  . mirabegron ER  25 mg Oral Daily  . venlafaxine XR  150 mg Oral Q breakfast   Continuous: . azithromycin Stopped (09/18/20 2018)  . cefTRIAXone (ROCEPHIN)  IV Stopped (09/18/20 1819)  . vancomycin     [id number], albuterol, morphine injection, ondansetron (ZOFRAN) IV, oxyCODONE Anti-infectives (From admission, onward)   Start     Dose/Rate Route Frequency Ordered Stop   09/19/20 1100  vancomycin (VANCOREADY) IVPB 750 mg/150 mL        750 mg 150 mL/hr over 60 Minutes Intravenous Every 24 hours 09/18/20 1255     09/18/20 1600  cefTRIAXone (ROCEPHIN) 2 g in sodium chloride 0.9 % 100 mL IVPB        2 g 200 mL/hr over 30 Minutes Intravenous Every 24 hours 09/18/20 1526 09/23/20 1559   09/18/20 1600  azithromycin (ZITHROMAX) 500 mg in sodium chloride 0.9 % 250 mL IVPB        500 mg 250 mL/hr over 60 Minutes Intravenous Every 24 hours 09/18/20 1526 09/23/20 1559   09/18/20 0945  vancomycin (VANCOREADY) IVPB 1250 mg/250 mL        1,250 mg 166.7 mL/hr over 90 Minutes Intravenous  Once 09/18/20 0932 09/18/20 1334   09/18/20 0930  ceFEPIme (MAXIPIME) 2 g in sodium chloride 0.9 % 100 mL IVPB        2 g 200 mL/hr over 30 Minutes Intravenous  Once 09/18/20 0926 09/18/20 1043      Results for orders placed or performed during the  hospital encounter of 09/18/20 (from the past 48 hour(s))  CBC     Status: Abnormal   Collection Time: 09/18/20  8:00 AM  Result Value Ref Range   WBC 47.1 (H) 4.0 - 10.5 K/uL   RBC 4.21 3.87 - 5.11 MIL/uL   Hemoglobin 12.8 12.0 - 15.0 g/dL   HCT  39.5 36.0 - 46.0 %   MCV 93.8 80.0 - 100.0 fL   MCH 30.4 26.0 - 34.0 pg   MCHC 32.4 30.0 - 36.0 g/dL   RDW 13.2 11.5 - 15.5 %   Platelets 410 (H) 150 - 400 K/uL   nRBC 0.0 0.0 - 0.2 %    Comment: Performed at [facility] [address], [place], [place] [address]  Resp Panel by RT-PCR (Flu A&B, Covid) Nasopharyngeal Swab     Status: None   Collection Time: 09/18/20  8:00 AM   Specimen: Nasopharyngeal Swab; Nasopharyngeal(NP) swabs in vial transport medium  Result Value Ref Range   SARS Coronavirus 2 by RT PCR NEGATIVE NEGATIVE    Comment: (NOTE) SARS-CoV-2 target nucleic acids are NOT DETECTED.  The SARS-CoV-2 RNA is generally detectable in upper respiratory specimens during the acute phase of infection. The lowest concentration of SARS-CoV-2 viral copies this assay can detect is 138 copies/mL. A negative result does not preclude SARS-Cov-2 infection and should not be used as the sole basis for treatment or other patient management decisions. A negative result may occur with  improper specimen collection/handling, submission of specimen other than nasopharyngeal swab, presence of viral mutation(s) within the areas targeted by this assay, and inadequate number of viral copies(<138 copies/mL). A negative result must be combined with clinical observations, patient history, and epidemiological information. The expected result is Negative.  Fact Sheet for Patients:  EntrepreneurPulse.com.au  Fact Sheet for Healthcare Providers:  IncredibleEmployment.be  This test is no t yet approved or cleared by the [place] FDA and  has been authorized for detection and/or diagnosis of SARS-CoV-2  by FDA under an Emergency Use Authorization (EUA). This EUA will remain  in effect (meaning this test can be used) for the duration of the COVID-19 declaration under Section 564(b)(1) of the Act, 21 U.S.C.section 360bbb-3(b)(1), unless the authorization is terminated  or revoked sooner.       Influenza A by PCR NEGATIVE NEGATIVE   Influenza B by PCR NEGATIVE NEGATIVE    Comment: (NOTE) The Xpert Xpress SARS-CoV-2/FLU/RSV plus assay is intended as an aid in the diagnosis of influenza from Nasopharyngeal swab specimens and should not be used as a sole basis for treatment. Nasal washings and aspirates are unacceptable for Xpert Xpress SARS-CoV-2/FLU/RSV testing.  Fact Sheet for Patients: EntrepreneurPulse.com.au  Fact Sheet for Healthcare Providers: IncredibleEmployment.be  This test is not yet approved or cleared by the [place] FDA and has been authorized for detection and/or diagnosis of SARS-CoV-2 by FDA under an Emergency Use Authorization (EUA). This EUA will remain in effect (meaning this test can be used) for the duration of the COVID-19 declaration under Section 564(b)(1) of the Act, 21 U.S.C. section 360bbb-3(b)(1), unless the authorization is terminated or revoked.  Performed at [facility], [place] [address], [place], [place] [address]   Brain natriuretic peptide     Status: Abnormal   Collection Time: 09/18/20  8:02 AM  Result Value Ref Range   B Natriuretic Peptide 296.9 (H) 0.0 - 100.0 pg/mL    Comment: Performed at [facility] [address] [place], [place] [address]  Blood culture (routine x 2)     Status: None (Preliminary result)   Collection Time: 09/18/20  9:22 AM   Specimen: BLOOD  Result Value Ref Range   Specimen Description BLOOD BLOOD RIGHT FOREARM    Special Requests      BOTTLES DRAWN AEROBIC AND ANAEROBIC Blood Culture  results may not be optimal due to an inadequate volume of blood received  in culture bottles   Culture      NO GROWTH < 24 HOURS Performed at [facility] [address] [place], [place] [address]    Report Status PENDING   Blood culture (routine x 2)     Status: None (Preliminary result)   Collection Time: 09/18/20  9:27 AM   Specimen: BLOOD  Result Value Ref Range   Specimen Description BLOOD RIGHT ANTECUBITAL    Special Requests      BOTTLES DRAWN AEROBIC AND ANAEROBIC Blood Culture results may not be optimal due to an inadequate volume of blood received in culture bottles   Culture      NO GROWTH < 24 HOURS Performed at [facility] [address], [place], [place] [address]    Report Status PENDING   Comprehensive metabolic panel     Status: Abnormal   Collection Time: 09/18/20  9:45 AM  Result Value Ref Range   Sodium 135 135 - 145 mmol/L   Potassium 5.6 (H) 3.5 - 5.1 mmol/L   Chloride 100 98 - 111 mmol/L   CO2 18 (L) 22 - 32 mmol/L   Glucose, Bld 104 (H) 70 - 99 mg/dL    Comment: Glucose reference range applies only to samples taken after fasting for at least 8 hours.   BUN 29 (H) 8 - 23 mg/dL   Creatinine, Ser 0.93 0.44 - 1.00 mg/dL   Calcium 9.2 8.9 - 10.3 mg/dL   Total Protein 7.2 6.5 - 8.1 g/dL   Albumin 2.6 (L) 3.5 - 5.0 g/dL   AST QUANTITY NOT SUFFICIENT, UNABLE TO PERFORM TEST 15 - 41 U/L    Comment: RESULT CALLED TO, READ BACK BY AND VERIFIED WITH: J.LYONS RN 1238 09/18/20 MCCORMICK K    ALT 6 0 - 44 U/L   Alkaline Phosphatase 172 (H) 38 - 126 U/L   Total Bilirubin 0.1 (L) 0.3 - 1.2 mg/dL   GFR, Estimated >60 >60 mL/min    Comment: (NOTE) Calculated using the CKD-EPI Creatinine Equation (2021)    Anion gap 17 (H) 5 - 15    Comment: Performed at [facility] [address], [place], [place] [address]  Troponin I (High Sensitivity)     Status: Abnormal   Collection Time: 09/18/20  9:45 AM  Result Value Ref Range   Troponin I (High Sensitivity) 30 (H) <18 ng/L    Comment: (NOTE) Elevated high sensitivity  troponin I (hsTnI) values and significant  changes across serial measurements may suggest ACS but many other  chronic and acute conditions are known to elevate hsTnI results.  Refer to the "Links" section for chest pain algorithms and additional  guidance. Performed at [facility], [place] [address], [place], [place] [address]   Lactic acid, plasma     Status: None   Collection Time: 09/18/20 10:03 AM  Result Value Ref Range   Lactic Acid, Venous 1.7 0.5 - 1.9 mmol/L    Comment: Performed at [facility] [address] [place], [place] [address]  D-dimer, quantitative     Status: Abnormal   Collection Time: 09/18/20  9:47 PM  Result Value Ref Range   D-Dimer, Quant 3.64 (H) 0.00 - 0.50 ug/mL-FEU    Comment: (NOTE) At the manufacturer cut-off value of 0.5 g/mL FEU, this assay has a negative predictive value of 95-100%.This assay is intended for use  in conjunction with a clinical pretest probability (PTP) assessment model to exclude pulmonary embolism (PE) and deep venous thrombosis (DVT) in outpatients suspected of PE or DVT. Results should be correlated with clinical presentation. Performed at [facility], [place] [address], [place], [place] [address]   Troponin I (High Sensitivity)     Status: Abnormal   Collection Time: 09/18/20  9:47 PM  Result Value Ref Range   Troponin I (High Sensitivity) 28 (H) <18 ng/L    Comment: (NOTE) Elevated high sensitivity troponin I (hsTnI) values and significant  changes across serial measurements may suggest ACS but many other  chronic and acute conditions are known to elevate hsTnI results.  Refer to the "Links" section for chest pain algorithms and additional  guidance. Performed at [facility], [place] [address] [place], [place] [address]   HIV Antibody (routine testing w rflx)     Status: None   Collection Time: 09/18/20  9:47 PM  Result Value Ref Range   HIV Screen 4th Generation wRfx Non Reactive Non Reactive     Comment: Performed at [facility], [place] [address] [place], [place] [address]  TSH     Status: None   Collection Time: 09/18/20  9:47 PM  Result Value Ref Range   TSH 1.077 0.350 - 4.500 uIU/mL    Comment: Performed by a 3rd Generation assay with a functional sensitivity of <=0.01 uIU/mL. Performed at [facility], [place] [address], [place], [place] [address]   Save Smear     Status: None   Collection Time: 09/18/20  9:47 PM  Result Value Ref Range   Smear Review SMEAR STAINED AND AVAILABLE FOR REVIEW     Comment: Performed at [facility], [place] [address] [place], [place] [address]  Lactate dehydrogenase     Status: None   Collection Time: 09/18/20  9:47 PM  Result Value Ref Range   LDH 152 98 - 192 U/L    Comment: Performed at [facility], [place] [address] [place], [place] [address]  Prealbumin     Status: Abnormal   Collection Time: 09/19/20  7:29 AM  Result Value Ref Range   Prealbumin <5 (L) 18 - 38 mg/dL    Comment: Performed at [facility] [address], [place], [place] [address]  CBC with Differential/Platelet     Status: Abnormal   Collection Time: 09/19/20  7:29 AM  Result Value Ref Range   WBC 39.8 (H) 4.0 - 10.5 K/uL   RBC 3.99 3.87 - 5.11 MIL/uL   Hemoglobin 11.8 (L) 12.0 - 15.0 g/dL   HCT 35.8 (L) 36.0 - 46.0 %   MCV 89.7 80.0 - 100.0 fL   MCH 29.6 26.0 - 34.0 pg   MCHC 33.0 30.0 - 36.0 g/dL   RDW 13.0 11.5 - 15.5 %   Platelets 400 150 - 400 K/uL   nRBC 0.0 0.0 - 0.2 %   Neutrophils Relative % 92 %   Neutro Abs 36.5 (H) 1.7 - 7.7 K/uL   Lymphocytes Relative 2 %   Lymphs Abs 0.8 0.7 - 4.0 K/uL   Monocytes Relative 5 %   Monocytes Absolute 1.9 (H) 0.1 - 1.0 K/uL   Eosinophils Relative 0 %   Eosinophils Absolute 0.0 0.0 - 0.5 K/uL   Basophils Relative 0 %   Basophils Absolute 0.1 0.0 - 0.1 K/uL   Immature Granulocytes 1 %   Abs Immature Granulocytes  0.57 (H) 0.00 - 0.07 K/uL    Comment: Performed at [facility],  [place] [address] [place], [place] [id number]  Basic metabolic panel     Status: Abnormal   Collection Time: 09/19/20  7:29 AM  Result Value Ref Range   Sodium 139 135 - 145 mmol/L   Potassium 3.8 3.5 - 5.1 mmol/L    Comment: NO VISIBLE HEMOLYSIS   Chloride 107 98 - 111 mmol/L   CO2 23 22 - 32 mmol/L   Glucose, Bld 139 (H) 70 - 99 mg/dL    Comment: Glucose reference range applies only to samples taken after fasting for at least 8 hours.   BUN 25 (H) 8 - 23 mg/dL   Creatinine, Ser 0.75 0.44 - 1.00 mg/dL   Calcium 8.7 (L) 8.9 - 10.3 mg/dL   GFR, Estimated >60 >60 mL/min    Comment: (NOTE) Calculated using the CKD-EPI Creatinine Equation (2021)    Anion gap 9 5 - 15    Comment: Performed at [facility] [address] [place], [place] [address]    CT CHEST WO CONTRAST  Result Date: 09/18/2020 CLINICAL DATA:  Abnormal chest radiograph, pleural effusion EXAM: CT CHEST WITHOUT CONTRAST TECHNIQUE: Multidetector CT imaging of the chest was performed following the standard protocol without IV contrast. COMPARISON:  Chest radiograph dated 09/18/2020. CTA chest dated 09/27/2015. FINDINGS: Cardiovascular: The heart is top-normal in size. No pericardial effusion. No evidence of thoracic aortic aneurysm. Atherosclerotic calcifications of the aortic arch. Three vessel coronary atherosclerosis. Mediastinum/Nodes: Small mediastinal lymph nodes, likely reactive. Visualized thyroid is grossly unremarkable. Lungs/Pleura: Biapical pleural-parenchymal scarring. Moderate centrilobular emphysematous changes, upper lung predominant. Moderate right pleural effusion, loculated. Associated right lower lobe compressive atelectasis. No focal consolidation. No suspicious pulmonary nodules. No pneumothorax. Upper Abdomen: Visualized upper abdomen is notable for motion degradation, calcified splenic granulomata, and vascular calcifications. Musculoskeletal: Mild degenerative changes of the visualized thoracolumbar spine.  IMPRESSION: Moderate right pleural effusion, loculated. Associated right lower lobe compressive atelectasis. Aortic Atherosclerosis (ICD10-I70.0) and Emphysema (ICD10-J43.9). Electronically Signed   By: Julian Hy M.D.   On: 09/18/2020 15:56   DG CHEST PORT 1 VIEW  Result Date: 09/19/2020 CLINICAL DATA:  Shortness of breath, dyspnea. EXAM: PORTABLE CHEST 1 VIEW COMPARISON:  September 18, 2020. FINDINGS: The heart size and mediastinal contours are within normal limits. No pneumothorax is noted. Left lung is clear. Continued presence of loculated right pleural effusion is noted with associated right basilar atelectasis or infiltrate. The visualized skeletal structures are unremarkable. IMPRESSION: Continued presence of loculated right pleural effusion with associated right basilar atelectasis or infiltrate. Electronically Signed   By: Marijo Conception M.D.   On: 09/19/2020 08:11   DG Chest Port 1 View  Result Date: 09/18/2020 CLINICAL DATA:  Shortness of breath. EXAM: PORTABLE CHEST 1 VIEW COMPARISON:  09/16/2020. FINDINGS: Heart size normal. Right base infiltrate with moderate right pleural effusion. Calcified nodule right upper lung again noted consistent calcified granuloma. No pneumothorax. No acute bony abnormality. Splenic calcifications consistent granulomas again noted. IMPRESSION: Right base infiltrate with moderate right pleural effusion. Electronically Signed   By: Marcello Moores  Register   On: 09/18/2020 08:21    Review of Systems  Unable to perform ROS: Dementia   Blood pressure 140/79, pulse (!) 104, temperature 98.3 F (36.8 C), temperature source Oral, resp. rate (!) 22, height 5\' 2"  (1.575 m), weight 47.5 kg, SpO2 93 %. Physical Exam Constitutional:      Comments: Elderly frail-appearing woman  in no distress.  Says she wants to go home.  HENT:     Head: Normocephalic and atraumatic.  Cardiovascular:     Rate and Rhythm: Normal rate and regular rhythm.     Pulses: Normal pulses.      Heart sounds: Normal heart sounds.  Pulmonary:     Effort: Pulmonary effort is normal.     Comments: Decreased breath sounds over the right lower lobe Abdominal:     General: Abdomen is flat. Bowel sounds are normal.     Palpations: Abdomen is soft.     Tenderness: There is no abdominal tenderness.  Musculoskeletal:        General: No swelling.  Lymphadenopathy:     Cervical: No cervical adenopathy.  Skin:    General: Skin is warm and dry.  Neurological:     Mental Status: She is alert.     Comments: Confused about her situation.  Wants to go home to see her dog.    Narrative & Impression  CLINICAL DATA:  Abnormal chest radiograph, pleural effusion  EXAM: CT CHEST WITHOUT CONTRAST  TECHNIQUE: Multidetector CT imaging of the chest was performed following the standard protocol without IV contrast.  COMPARISON:  Chest radiograph dated 09/18/2020. CTA chest dated 09/27/2015.  FINDINGS: Cardiovascular: The heart is top-normal in size. No pericardial effusion.  No evidence of thoracic aortic aneurysm. Atherosclerotic calcifications of the aortic arch.  Three vessel coronary atherosclerosis.  Mediastinum/Nodes: Small mediastinal lymph nodes, likely reactive.  Visualized thyroid is grossly unremarkable.  Lungs/Pleura: Biapical pleural-parenchymal scarring.  Moderate centrilobular emphysematous changes, upper lung predominant.  Moderate right pleural effusion, loculated. Associated right lower lobe compressive atelectasis.  No focal consolidation.  No suspicious pulmonary nodules.  No pneumothorax.  Upper Abdomen: Visualized upper abdomen is notable for motion degradation, calcified splenic granulomata, and vascular calcifications.  Musculoskeletal: Mild degenerative changes of the visualized thoracolumbar spine.  IMPRESSION: Moderate right pleural effusion, loculated. Associated right lower lobe compressive atelectasis.  Aortic  Atherosclerosis (ICD10-I70.0) and Emphysema (ICD10-J43.9).   Electronically Signed   By: Julian Hy M.D.   On: 09/18/2020 15:56     Assessment/Plan:  This 80 year old frail woman with dementia and COPD on oxygen has a loculated moderate-sized right pleural effusion consistent with empyema.  She likely developed some pneumonia with secondary pleural effusion and empyema.  I do not think she is a candidate for surgical drainage.  I think an attempt at percutaneous drainage by IR is reasonable especially considering her leukocytosis.  I am not sure how much of this will be able to drain with a percutaneous catheter but it is worth a try.  She may benefit from thrombolytic therapy.  Apparently her family has mentioned that she was having some choking and coughing after eating and talking and certainly could have aspirated.  Gaye Pollack 09/19/2020, 9:54 AM

## 2020-09-19 NOTE — Procedures (Signed)
Interventional Radiology Procedure Note  Procedure: [place] AND FLUORO RT CHEST TUBE INSERTION    Complications: None  Estimated Blood Loss:  MIN  Findings: YELLOW PLEURAL FLD ASPIRATED LABS SENT    Tamera Punt, MD

## 2020-09-19 NOTE — Progress Notes (Signed)
Chief Complaint: Patient was seen in consultation today for loculated right effusion  Referring Physician(s): Dr. Bonnielee Haff  Supervising Physician: Daryll Brod  Patient Status: [facility] - In-pt  History of Present Illness: [name] is a 80 y.o. female with SOB. Found to have large loculated right pleural effusion and leukocytosis. CTS was consulted and feel the pt is high risk for surgery and has recommended IR place perc chest tube. PMHx, meds, labs, imaging, allergies reviewed.    Past Medical History:  Diagnosis Date  . Anxiety   . Arthritis   . Cancer [facility])    breast CA 2002  . CHF (congestive heart failure) ([place])   . COPD (chronic obstructive pulmonary disease) ([place])   . Dementia ([place])   . Hypercholesterolemia   . Hyperthyroidism   . Osteopenia     Past Surgical History:  Procedure Laterality Date  . ABDOMINAL HYSTERECTOMY    . APPENDECTOMY    . BACK SURGERY    . BREAST SURGERY    . MASTECTOMY W/ SENTINEL NODE BIOPSY Left 02/26/2016   Procedure: LEFT MASTECTOMY WITH SENTINEL LYMPH NODE BIOPSY;  Surgeon: Coralie Keens, MD;  Location: [place];  Service: General;  Laterality: Left;    Allergies: Patient has no known allergies.  Medications:  Current Facility-Administered Medications:  .  acetaminophen (TYLENOL) tablet 650 mg, 650 mg, Oral, Q6H PRN, Bonnielee Haff, MD .  albuterol (PROVENTIL) (2.5 MG/3ML) 0.083% nebulizer solution 2.5 mg, 2.5 mg, Nebulization, Q2H PRN, Tamala Julian, Rondell A, MD, 2.5 mg at 09/19/20 0707 .  anastrozole (ARIMIDEX) tablet 1 mg, 1 mg, Oral, Daily, Tamala Julian, Rondell A, MD, 1 mg at 09/19/20 0826 .  arformoterol (BROVANA) nebulizer solution 15 mcg, 15 mcg, Nebulization, BID, Tamala Julian, Rondell A, MD, 15 mcg at 09/19/20 0747 .  atorvastatin (LIPITOR) tablet 40 mg, 40 mg, Oral, QHS, Smith, Rondell A, MD, 40 mg at 09/18/20 2146 .  azithromycin (ZITHROMAX) 500 mg in sodium chloride 0.9 % 250 mL IVPB, 500 mg, Intravenous, Q24H, Norval Morton, MD, Stopped at 09/18/20 2018 .  budesonide (PULMICORT) nebulizer solution 0.5 mg, 0.5 mg, Nebulization, BID, Smith, Rondell A, MD, 0.5 mg at 09/19/20 0814 .  carvedilol (COREG) tablet 3.125 mg, 3.125 mg, Oral, BID WC, Smith, Rondell A, MD, 3.125 mg at 09/19/20 0746 .  cefTRIAXone (ROCEPHIN) 2 g in sodium chloride 0.9 % 100 mL IVPB, 2 g, Intravenous, Q24H, Norval Morton, MD, Stopped at 09/18/20 1819 .  enoxaparin (LOVENOX) injection 40 mg, 40 mg, Subcutaneous, Q24H, Smith, Rondell A, MD, 40 mg at 09/18/20 1857 .  famotidine (PEPCID) tablet 40 mg, 40 mg, Oral, QHS, Smith, Rondell A, MD, 40 mg at 09/18/20 2146 .  gabapentin (NEURONTIN) capsule 200 mg, 200 mg, Oral, BID, Smith, Rondell A, MD, 200 mg at 09/19/20 0803 .  ipratropium-albuterol (DUONEB) 0.5-2.5 (3) MG/3ML nebulizer solution 3 mL, 3 mL, Nebulization, QID, Smith, Rondell A, MD, 3 mL at 09/19/20 1513 .  levothyroxine (SYNTHROID) tablet 25 mcg, 25 mcg, Oral, QAC breakfast, Fuller Plan A, MD, 25 mcg at 09/19/20 0656 .  lidocaine (XYLOCAINE) 1 % (with pres) injection, , , ,  .  memantine (NAMENDA) tablet 10 mg, 10 mg, Oral, BID, Smith, Rondell A, MD, 10 mg at 09/19/20 0803 .  mirabegron ER (MYRBETRIQ) tablet 25 mg, 25 mg, Oral, Daily, Tamala Julian, Rondell A, MD, 25 mg at 09/19/20 0826 .  morphine 2 MG/ML injection 2 mg, 2 mg, Intravenous, Q3H PRN, Bonnielee Haff, MD .  ondansetron [facility]) injection  4 mg, 4 mg, Intravenous, Q6H PRN, Bonnielee Haff, MD .  oxyCODONE (Oxy IR/ROXICODONE) immediate release tablet 5 mg, 5 mg, Oral, Q6H PRN, Bonnielee Haff, MD, 5 mg at 09/19/20 0904 .  vancomycin (VANCOREADY) IVPB 750 mg/150 mL, 750 mg, Intravenous, Q24H, Lajean Saver, MD, Last Rate: 150 mL/hr at 09/19/20 1127, 750 mg at 09/19/20 1127 .  venlafaxine XR (EFFEXOR-XR) 24 hr capsule 150 mg, 150 mg, Oral, Q breakfast, Tamala Julian, Rondell A, MD, 150 mg at 09/19/20 0746    Family History  Problem Relation Age of Onset  . Stroke Father   .  Diabetes Mellitus I Son   . Leukemia Son     Social History   Socioeconomic History  . Marital status: Widowed    Spouse name: Not on file  . Number of children: 2  . Years of education: 58  . Highest education level: Not on file  Occupational History  . Not on file  Tobacco Use  . Smoking status: Former Smoker    Quit date: 02/03/1994    Years since quitting: 26.6  . Smokeless tobacco: Never Used  Substance and Sexual Activity  . Alcohol use: Yes    Comment: 02/04/16 1 glass  wine daily  . Drug use: No  . Sexual activity: Not on file  Other Topics Concern  . Not on file  Social History Narrative   Lives at [place] with 2 dogs   Caffeine - coffee, 1 cup daily   Social Determinants of Health   Financial Resource Strain: Not on file  Food Insecurity: Not on file  Transportation Needs: Not on file  Physical Activity: Not on file  Stress: Not on file  Social Connections: Not on file     Review of Systems: A 12 point ROS discussed and pertinent positives are indicated in the HPI above.  All other systems are negative.  Review of Systems  Vital Signs: BP 123/68 (BP Location: Left Arm)   Pulse (!) 102   Temp 97.9 F (36.6 C) (Oral)   Resp 18   Ht 5\' 2"  (1.575 m)   Wt 47.5 kg   SpO2 99%   BMI 19.17 kg/m   Physical Exam HENT:     Mouth/Throat:     Mouth: Mucous membranes are moist.     Pharynx: Oropharynx is clear.  Cardiovascular:     Rate and Rhythm: Normal rate and regular rhythm.     Heart sounds: Normal heart sounds.  Pulmonary:     Effort: Pulmonary effort is normal. No respiratory distress.     Comments: Diminished (R)BS Neurological:     Comments: Pleasantly confused but answers questions. Not oriented to place or time.       Imaging: DG Chest 2 View  Result Date: 09/16/2020 CLINICAL DATA:  Lower right rib pain after a fall EXAM: CHEST - 2 VIEW COMPARISON:  09/27/2015 FINDINGS: Heart size and pulmonary vascularity are normal.  Emphysematous changes and scattered fibrosis in the lungs. Calcified granuloma in the right upper lung. Infiltration in the right lower lung posteriorly with small right pleural effusion, likely pneumonia. In the setting of trauma, this could also indicate pulmonary contusion. No displaced rib fractures are identified although rib views were not obtained. IMPRESSION: 1. Emphysematous changes and fibrosis in the lungs. 2. Infiltration in the right lower lung with small right pleural effusion, likely pneumonia. Electronically Signed   By: Lucienne Capers M.D.   On: 09/16/2020 23:07   CT Head Wo Contrast  Result Date: 09/17/2020 CLINICAL DATA:  Mechanical fall. EXAM: CT HEAD WITHOUT CONTRAST CT CERVICAL SPINE WITHOUT CONTRAST TECHNIQUE: Multidetector CT imaging of the head and cervical spine was performed following the standard protocol without intravenous contrast. Multiplanar CT image reconstructions of the cervical spine were also generated. COMPARISON:  None. FINDINGS: CT HEAD FINDINGS Brain: Diffuse cerebral atrophy. Ventricular dilatation likely due to central atrophy. Low-attenuation changes in the deep white matter consistent with small vessel ischemia. No mass-effect or midline shift. No abnormal extra-axial fluid collections. 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 Basal cisterns are not effaced. No acute intracranial hemorrhage. Vascular: Moderate intracranial arterial vascular calcifications. Skull: The calvarium appears intact. Sinuses/Orbits: Paranasal sinuses and mastoid air cells are clear. Other: None. CT CERVICAL SPINE FINDINGS Alignment: Normal alignment. Skull base and vertebrae: No acute fracture. No primary bone lesion or focal pathologic process. Soft tissues and spinal canal: No prevertebral fluid or swelling. No visible canal hematoma. Disc levels: Degenerative changes with disc space narrowing and endplate hypertrophic  changes throughout. Changes are most prominent at C4-5 and C5-6 levels. Prominent disc osteophyte complex at C5-6 level. Upper chest: Emphysematous changes and scarring in the lung apices. Other: None. IMPRESSION: 1. No acute intracranial abnormalities. Chronic atrophy and small vessel ischemic changes. 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 2. Normal alignment of the cervical spine. Degenerative changes. No acute displaced fractures identified. 3. Emphysematous changes and scarring in the lung apices. Emphysema (ICD10-J43.9). Electronically Signed   By: Lucienne Capers M.D.   On: 09/17/2020 00:29   CT CHEST WO CONTRAST  Result Date: 09/18/2020 CLINICAL DATA:  Abnormal chest radiograph, pleural effusion EXAM: CT CHEST WITHOUT CONTRAST TECHNIQUE: Multidetector CT imaging of the chest was performed following the standard protocol without IV contrast. COMPARISON:  Chest radiograph dated 09/18/2020. CTA chest dated 09/27/2015. FINDINGS: Cardiovascular: The heart is top-normal in size. No pericardial effusion. No evidence of thoracic aortic aneurysm. Atherosclerotic calcifications of the aortic arch. Three vessel coronary atherosclerosis. Mediastinum/Nodes: Small mediastinal lymph nodes, likely reactive. Visualized thyroid is grossly unremarkable. Lungs/Pleura: Biapical pleural-parenchymal scarring. Moderate centrilobular emphysematous changes, upper lung predominant. Moderate right pleural effusion, loculated. Associated right lower lobe compressive atelectasis. No focal consolidation. No suspicious pulmonary nodules. No pneumothorax. Upper Abdomen: Visualized upper abdomen is notable for motion degradation, calcified splenic granulomata, and vascular calcifications. Musculoskeletal: Mild degenerative changes of the visualized thoracolumbar spine. IMPRESSION: Moderate right pleural effusion, loculated. Associated right lower lobe compressive atelectasis. Aortic  Atherosclerosis (ICD10-I70.0) and Emphysema (ICD10-J43.9). Electronically Signed   By: Julian Hy M.D.   On: 09/18/2020 15:56   CT Cervical Spine Wo Contrast  Result Date: 09/17/2020 CLINICAL DATA:  Mechanical fall. EXAM: CT HEAD WITHOUT CONTRAST CT CERVICAL SPINE WITHOUT CONTRAST TECHNIQUE: Multidetector CT imaging of the head and cervical spine was performed following the standard protocol without intravenous contrast. Multiplanar CT image reconstructions of the cervical spine were also generated. COMPARISON:  None. FINDINGS: CT HEAD FINDINGS Brain: Diffuse cerebral atrophy. Ventricular dilatation likely due to central atrophy. Low-attenuation changes in the deep white matter consistent with small vessel ischemia. No mass-effect or midline shift. No abnormal extra-axial fluid collections. 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 Basal cisterns are not effaced. No acute intracranial hemorrhage. Vascular: Moderate intracranial arterial vascular calcifications. Skull: The calvarium appears intact. Sinuses/Orbits: Paranasal sinuses and mastoid air cells are clear. Other: None. CT CERVICAL SPINE FINDINGS Alignment: Normal  alignment. Skull base and vertebrae: No acute fracture. No primary bone lesion or focal pathologic process. Soft tissues and spinal canal: No prevertebral fluid or swelling. No visible canal hematoma. Disc levels: Degenerative changes with disc space narrowing and endplate hypertrophic changes throughout. Changes are most prominent at C4-5 and C5-6 levels. Prominent disc osteophyte complex at C5-6 level. Upper chest: Emphysematous changes and scarring in the lung apices. Other: None. IMPRESSION: 1. No acute intracranial abnormalities. Chronic atrophy and small vessel ischemic changes. 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 2. Normal alignment of the cervical spine. Degenerative changes.  No acute displaced fractures identified. 3. Emphysematous changes and scarring in the lung apices. Emphysema (ICD10-J43.9). Electronically Signed   By: Lucienne Capers M.D.   On: 09/17/2020 00:29   DG CHEST PORT 1 VIEW  Result Date: 09/19/2020 CLINICAL DATA:  Shortness of breath, dyspnea. EXAM: PORTABLE CHEST 1 VIEW COMPARISON:  September 18, 2020. FINDINGS: The heart size and mediastinal contours are within normal limits. No pneumothorax is noted. Left lung is clear. Continued presence of loculated right pleural effusion is noted with associated right basilar atelectasis or infiltrate. The visualized skeletal structures are unremarkable. IMPRESSION: Continued presence of loculated right pleural effusion with associated right basilar atelectasis or infiltrate. Electronically Signed   By: Marijo Conception M.D.   On: 09/19/2020 08:11   DG Chest Port 1 View  Result Date: 09/18/2020 CLINICAL DATA:  Shortness of breath. EXAM: PORTABLE CHEST 1 VIEW COMPARISON:  09/16/2020. FINDINGS: Heart size normal. Right base infiltrate with moderate right pleural effusion. Calcified nodule right upper lung again noted consistent calcified granuloma. No pneumothorax. No acute bony abnormality. Splenic calcifications consistent granulomas again noted. IMPRESSION: Right base infiltrate with moderate right pleural effusion. Electronically Signed   By: Marcello Moores  Register   On: 09/18/2020 08:21   DG Swallowing Func-Speech Pathology  Result Date: 09/19/2020 Objective Swallowing Evaluation: Type of Study: MBS-Modified Barium Swallow Study  Patient Details Name: Aurorah Carriero MRN: [id number] Date of Birth: 1941/02/05 Today's Date: 09/19/2020 Time: SLP Start Time (ACUTE ONLY): [id number] -SLP Stop Time (ACUTE ONLY): 1327 SLP Time Calculation (min) (ACUTE ONLY): 21 min Past Medical History: Past Medical History: Diagnosis Date . Anxiety  . Arthritis  . Cancer [facility])   breast CA 2002 . CHF (congestive heart failure) ([place])  . COPD (chronic obstructive  pulmonary disease) ([place])  . Dementia ([place])  . Hypercholesterolemia  . Hyperthyroidism  . Osteopenia  Past Surgical History: Past Surgical History: Procedure Laterality Date . ABDOMINAL HYSTERECTOMY   . APPENDECTOMY   . BACK SURGERY   . BREAST SURGERY   . MASTECTOMY W/ SENTINEL NODE BIOPSY Left 02/26/2016  Procedure: LEFT MASTECTOMY WITH SENTINEL LYMPH NODE BIOPSY;  Surgeon: Coralie Keens, MD;  Location: [place];  Service: General;  Laterality: Left; HPI: Pt is a 80 yo female presenting with SOB, admitted with sepsis secondary to R empyema with respiratory failure. CXR concerning for R-sided PNA with moderate pleural effusion. Family reported coughing while eating and talking. PMH includes: dementia, COPD, systolic CHF, hypothyroidism, breast ca s/p L mastectomy, recent fall  Subjective: alert, pleasant, needs repetition Assessment / Plan / Recommendation CHL IP CLINICAL IMPRESSIONS 09/19/2020 Clinical Impression Pt presents with a pharyngeal more than oral dysphagia with decreased airway protection across all liquids. She has reduced anterior hyoid movement, base of tongue retraction, and epiglottic inversion that results in airway invasion during the swallow, but also leaves increasing amounts of pharyngeal residue as boluses become thicker. Thin  liquids are aspirated silently, and cannot be cleared with a cued cough. A chin tuck does NOT prevent aspiration. With nectar thick liquids she penetrates above the vocal folds under certain conditions, such as when she takes straw sips, large volumes via cup, or if her valleculae is full of nectar-thick residue that spills over the epiglottis as she is working on more prolonged mastication of solids. This penetration clears more readily with a cued cough or throat clear, and the remaining cup sips are consumed without entering the airway. Honey thick liquids are aspirated, perhaps in part due to the posterior head tilt that she uses to access them from the cup. Recommend  starting wtih Dys 2 (finely chopped) foods and nectar thick liquids by cup. Would encourage small, single sips and intermittent throat clears, although anticipate that pt will need assistance for reinforcement.  SLP Visit Diagnosis Dysphagia, oropharyngeal phase (R13.12) Attention and concentration deficit following -- Frontal lobe and executive function deficit following -- Impact on safety and function Moderate aspiration risk   CHL IP TREATMENT RECOMMENDATION 09/19/2020 Treatment Recommendations Therapy as outlined in treatment plan below   Prognosis 09/19/2020 Prognosis for Safe Diet Advancement Fair Barriers to Reach Goals Cognitive deficits Barriers/Prognosis Comment -- CHL IP DIET RECOMMENDATION 09/19/2020 SLP Diet Recommendations Dysphagia 2 (Fine chop) solids;Nectar thick liquid Liquid Administration via Cup;No straw Medication Administration Crushed with puree Compensations Minimize environmental distractions;Slow rate;Small sips/bites;Clear throat intermittently Postural Changes Remain semi-upright after after feeds/meals (Comment);Seated upright at 90 degrees   CHL IP OTHER RECOMMENDATIONS 09/19/2020 Recommended Consults -- Oral Care Recommendations Oral care BID Other Recommendations Order thickener from pharmacy;Prohibited food (jello, ice cream, thin soups);Remove water pitcher   CHL IP FOLLOW UP RECOMMENDATIONS 09/19/2020 Follow up Recommendations Skilled Nursing facility   [facility] IP FREQUENCY AND DURATION 09/19/2020 Speech Therapy Frequency (ACUTE ONLY) min 2x/week Treatment Duration 2 weeks      CHL IP ORAL PHASE 09/19/2020 Oral Phase Impaired Oral - Pudding Teaspoon -- Oral - Pudding Cup -- Oral - Honey Teaspoon -- Oral - Honey Cup Lingual/palatal residue Oral - Nectar Teaspoon -- Oral - Nectar Cup WFL Oral - Nectar Straw WFL Oral - Thin Teaspoon -- Oral - Thin Cup WFL Oral - Thin Straw WFL Oral - Puree WFL Oral - Mech Soft Impaired mastication;Lingual/palatal residue Oral - Regular -- Oral -  Multi-Consistency -- Oral - Pill -- Oral Phase - Comment --  CHL IP PHARYNGEAL PHASE 09/19/2020 Pharyngeal Phase -- Pharyngeal- Pudding Teaspoon -- Pharyngeal -- Pharyngeal- Pudding Cup -- Pharyngeal -- Pharyngeal- Honey Teaspoon -- Pharyngeal -- Pharyngeal- Honey Cup Reduced epiglottic inversion;Reduced anterior laryngeal mobility;Reduced tongue base retraction;Pharyngeal residue - valleculae;Penetration/Aspiration during swallow;Lateral channel residue Pharyngeal Material enters airway, passes BELOW cords then ejected out Pharyngeal- Nectar Teaspoon -- Pharyngeal -- Pharyngeal- Nectar Cup Reduced epiglottic inversion;Reduced anterior laryngeal mobility;Reduced tongue base retraction;Pharyngeal residue - valleculae;Penetration/Apiration after swallow;Penetration/Aspiration during swallow Pharyngeal Material enters airway, remains ABOVE vocal cords and not ejected out Pharyngeal- Nectar Straw Reduced epiglottic inversion;Reduced anterior laryngeal mobility;Reduced tongue base retraction;Pharyngeal residue - valleculae;Lateral channel residue;Penetration/Aspiration during swallow Pharyngeal Material enters airway, remains ABOVE vocal cords and not ejected out Pharyngeal- Thin Teaspoon -- Pharyngeal -- Pharyngeal- Thin Cup Reduced epiglottic inversion;Reduced anterior laryngeal mobility;Reduced tongue base retraction;Pharyngeal residue - valleculae;Penetration/Aspiration during swallow Pharyngeal Material enters airway, passes BELOW cords without attempt by patient to eject out (silent aspiration) Pharyngeal- Thin Straw Reduced epiglottic inversion;Reduced anterior laryngeal mobility;Reduced tongue base retraction;Pharyngeal residue - valleculae;Compensatory strategies attempted (with notebox);Penetration/Aspiration during swallow Pharyngeal Material enters airway,  passes BELOW cords without attempt by patient to eject out (silent aspiration) Pharyngeal- Puree Reduced epiglottic inversion;Reduced anterior laryngeal  mobility;Reduced tongue base retraction;Pharyngeal residue - valleculae Pharyngeal -- Pharyngeal- Mechanical Soft Reduced epiglottic inversion;Reduced anterior laryngeal mobility;Reduced tongue base retraction;Pharyngeal residue - valleculae Pharyngeal -- Pharyngeal- Regular -- Pharyngeal -- Pharyngeal- Multi-consistency -- Pharyngeal -- Pharyngeal- Pill -- Pharyngeal -- Pharyngeal Comment --  CHL IP CERVICAL ESOPHAGEAL PHASE 09/19/2020 Cervical Esophageal Phase Impaired Pudding Teaspoon -- Pudding Cup -- Honey Teaspoon -- Honey Cup Reduced cricopharyngeal relaxation Nectar Teaspoon -- Nectar Cup Reduced cricopharyngeal relaxation Nectar Straw Reduced cricopharyngeal relaxation Thin Teaspoon -- Thin Cup Reduced cricopharyngeal relaxation Thin Straw Reduced cricopharyngeal relaxation Puree Reduced cricopharyngeal relaxation Mechanical Soft Reduced cricopharyngeal relaxation Regular -- Multi-consistency -- Pill -- Cervical Esophageal Comment -- Osie Bond., M.A. CCC-SLP Acute Rehabilitation Services Pager [phone] Office [phone] 09/19/2020, 3:03 PM               Labs:  CBC: Recent Labs    09/18/20 0800 09/19/20 0729  WBC 47.1* 39.8*  HGB 12.8 11.8*  HCT 39.5 35.8*  PLT 410* 400    COAGS: No results for input(s): INR, APTT in the last 8760 hours.  BMP: Recent Labs    09/18/20 0945 09/19/20 0729  NA 135 139  K 5.6* 3.8  CL 100 107  CO2 18* 23  GLUCOSE 104* 139*  BUN 29* 25*  CALCIUM 9.2 8.7*  CREATININE 0.93 0.75  GFRNONAA >60 >60    LIVER FUNCTION TESTS: Recent Labs    09/18/20 0945  BILITOT 0.1*  AST QUANTITY NOT SUFFICIENT, UNABLE TO PERFORM TEST  ALT 6  ALKPHOS 172*  PROT 7.2  ALBUMIN 2.6*    TUMOR MARKERS: No results for input(s): AFPTM, CEA, CA199, CHROMGRNA in the last 8760 hours.  Assessment and Plan: Loculated (R)effusion, possible empyema Plan for image guided chest tube placement Labs reviewed. Risks and benefits discussed with the patient and her  son (via telephone) including bleeding, infection, damage to adjacent structures, lung perforation/fistula connection, and sepsis.  All questions were answered, patient is agreeable to proceed. Consent signed and in chart.    Thank you for this interesting consult.  I greatly enjoyed meeting [name] and look forward to participating in their care.  A copy of this report was sent to the requesting provider on this date.  Electronically Signed: Ascencion Dike, PA-C 09/19/2020, 4:10 PM   I spent a total of 20 minutes in face to face in clinical consultation, greater than 50% of which was counseling/coordinating care for right chest drain

## 2020-09-19 NOTE — Progress Notes (Signed)
pt was able to swallow whole pills ok.Pt has Swallow eval ordered and to follow up  diet order.

## 2020-09-19 NOTE — Progress Notes (Signed)
Pt complaining SOB with wheezing, she noted chest tightness. Bump O2 at  3liters spo2 90%. DR Maryland Pink was notified. IVF was stopped. -ordered EKG,and CXray. Morphine as well. -albuterol given. -endorsed to day shift RN

## 2020-09-19 NOTE — Evaluation (Signed)
Clinical/Bedside Swallow Evaluation Patient Details  Name: [name] MRN: [id number] Date of Birth: May 19, 1941  Today's Date: 09/19/2020 Time: SLP Start Time (ACUTE ONLY): 3235 SLP Stop Time (ACUTE ONLY): 1137 SLP Time Calculation (min) (ACUTE ONLY): 25 min  Past Medical History:  Past Medical History:  Diagnosis Date  . Anxiety   . Arthritis   . Cancer [facility])    breast CA 2002  . CHF (congestive heart failure) ([place])   . COPD (chronic obstructive pulmonary disease) ([place])   . Dementia ([place])   . Hypercholesterolemia   . Hyperthyroidism   . Osteopenia    Past Surgical History:  Past Surgical History:  Procedure Laterality Date  . ABDOMINAL HYSTERECTOMY    . APPENDECTOMY    . BACK SURGERY    . BREAST SURGERY    . MASTECTOMY W/ SENTINEL NODE BIOPSY Left 02/26/2016   Procedure: LEFT MASTECTOMY WITH SENTINEL LYMPH NODE BIOPSY;  Surgeon: Coralie Keens, MD;  Location: [facility];  Service: General;  Laterality: Left;   HPI:  Pt is a 80 yo female presenting with SOB, admitted with sepsis secondary to R empyema with respiratory failure. CXR concerning for R-sided PNA with moderate pleural effusion. Family reported coughing while eating and talking. PMH includes: dementia, COPD, systolic CHF, hypothyroidism, breast ca s/p L mastectomy, recent fall   Assessment / Plan / Recommendation Clinical Impression  Pt demonstrates signs of potential aspiration that include frequent coughing with thin liquid intake despite removal of straw and cues for small, single sips. Coughing was noted x1 with more solid textures, and her granddaughter endorses some coughing with chewier candies PTA. Given current pulmonary status and concern for dysphagia, pt and family are interested in pursuing MBS to better evaluate oropharyngeal function. Will plan to complete this afternoon. SLP Visit Diagnosis: Dysphagia, unspecified (R13.10)    Aspiration Risk  Moderate aspiration risk    Diet Recommendation NPO except  meds   Medication Administration: Crushed with puree    Other  Recommendations Oral Care Recommendations: Oral care QID   Follow up Recommendations  (tba)      Frequency and Duration            Prognosis Prognosis for Safe Diet Advancement: Good Barriers to Reach Goals: Cognitive deficits      Swallow Study   General HPI: Pt is a 80 yo female presenting with SOB, admitted with sepsis secondary to R empyema with respiratory failure. CXR concerning for R-sided PNA with moderate pleural effusion. Family reported coughing while eating and talking. PMH includes: dementia, COPD, systolic CHF, hypothyroidism, breast ca s/p L mastectomy, recent fall Type of Study: Bedside Swallow Evaluation Previous Swallow Assessment: none in chart Diet Prior to this Study: NPO Temperature Spikes Noted: No Respiratory Status: Room air History of Recent Intubation: No Behavior/Cognition: Alert;Cooperative;Pleasant mood;Requires cueing Oral Cavity Assessment: Within Functional Limits Oral Care Completed by SLP: No Oral Cavity - Dentition: Adequate natural dentition Vision: Functional for self-feeding Self-Feeding Abilities: Able to feed self Patient Positioning: Upright in bed Baseline Vocal Quality: Normal Volitional Cough: Strong Volitional Swallow: Able to elicit    Oral/Motor/Sensory Function Overall Oral Motor/Sensory Function: Within functional limits   Ice Chips Ice chips: Not tested   Thin Liquid Thin Liquid: Impaired Presentation: Cup;Self Fed;Straw Pharyngeal  Phase Impairments: Cough - Immediate    Nectar Thick Nectar Thick Liquid: Not tested   Honey Thick Honey Thick Liquid: Not tested   Puree Puree: Within functional limits Presentation: Self Fed;Spoon   Solid  Solid: Impaired Presentation: Self Fed Pharyngeal Phase Impairments: Cough - Immediate       Osie Bond., M.A. [place] Pager [phone] Office [phone]  09/19/2020,12:34  PM

## 2020-09-20 ENCOUNTER — Inpatient Hospital Stay (HOSPITAL_COMMUNITY): Payer: Medicare Other

## 2020-09-20 DIAGNOSIS — F0391 Unspecified dementia with behavioral disturbance: Secondary | ICD-10-CM | POA: Diagnosis not present

## 2020-09-20 DIAGNOSIS — J90 Pleural effusion, not elsewhere classified: Secondary | ICD-10-CM | POA: Diagnosis not present

## 2020-09-20 DIAGNOSIS — J189 Pneumonia, unspecified organism: Secondary | ICD-10-CM

## 2020-09-20 LAB — CBC
HCT: 32.4 % — ABNORMAL LOW (ref 36.0–46.0)
Hemoglobin: 10.8 g/dL — ABNORMAL LOW (ref 12.0–15.0)
MCH: 30.1 pg (ref 26.0–34.0)
MCHC: 33.3 g/dL (ref 30.0–36.0)
MCV: 90.3 fL (ref 80.0–100.0)
Platelets: 336 10*3/uL (ref 150–400)
RBC: 3.59 MIL/uL — ABNORMAL LOW (ref 3.87–5.11)
RDW: 13 % (ref 11.5–15.5)
WBC: 28.3 10*3/uL — ABNORMAL HIGH (ref 4.0–10.5)
nRBC: 0 % (ref 0.0–0.2)

## 2020-09-20 LAB — STREP PNEUMONIAE URINARY ANTIGEN: Strep Pneumo Urinary Antigen: NEGATIVE

## 2020-09-20 LAB — BASIC METABOLIC PANEL
Anion gap: 11 (ref 5–15)
BUN: 19 mg/dL (ref 8–23)
CO2: 26 mmol/L (ref 22–32)
Calcium: 8.6 mg/dL — ABNORMAL LOW (ref 8.9–10.3)
Chloride: 101 mmol/L (ref 98–111)
Creatinine, Ser: 0.68 mg/dL (ref 0.44–1.00)
GFR, Estimated: >60 mL/min (ref >60)
Glucose, Bld: 123 mg/dL — ABNORMAL HIGH (ref 70–99)
Potassium: 3.3 mmol/L — ABNORMAL LOW (ref 3.5–5.1)
Sodium: 138 mmol/L (ref 135–145)

## 2020-09-20 LAB — PH, BODY FLUID: pH, Body Fluid: 7

## 2020-09-20 LAB — LD, BODY FLUID (OTHER): LD, Body Fluid: 1574 IU/L

## 2020-09-20 LAB — GRAM STAIN

## 2020-09-20 LAB — PROCALCITONIN: Procalcitonin: 0.81 ng/mL

## 2020-09-20 LAB — MRSA PCR SCREENING: MRSA by PCR: NEGATIVE

## 2020-09-20 MED ORDER — IPRATROPIUM-ALBUTEROL 0.5-2.5 (3) MG/3ML IN SOLN
3.0000 mL | Freq: Three times a day (TID) | RESPIRATORY_TRACT | Status: DC
  Administered 2020-09-20 – 2020-09-24 (×13): 3 mL via RESPIRATORY_TRACT
  Filled 2020-09-20 (×13): qty 3

## 2020-09-20 MED ORDER — RESOURCE THICKENUP CLEAR PO POWD
ORAL | Status: DC | PRN
  Filled 2020-09-20 (×3): qty 125

## 2020-09-20 MED ORDER — OXYCODONE HCL 5 MG PO TABS
5.0000 mg | ORAL_TABLET | ORAL | Status: DC | PRN
  Administered 2020-09-20 – 2020-09-28 (×10): 5 mg via ORAL
  Filled 2020-09-20 (×10): qty 1

## 2020-09-20 MED ORDER — POTASSIUM CHLORIDE 20 MEQ PO PACK
40.0000 meq | PACK | Freq: Once | ORAL | Status: AC
  Administered 2020-09-20: 40 meq via ORAL
  Filled 2020-09-20: qty 2

## 2020-09-20 NOTE — Progress Notes (Signed)
[facility] PROGRESS NOTE   Nickita Kuenzi [id number] DOB: 12/11/1940 DOA: 09/18/2020  PCP: [name], MD  Brief History/Interval Summary: 80 y.o. female with medical history significant of COPD,  chronic systolic CHF [id number], hypothyroidism, breast cancer s/p left mastectomy presents from [facility] due to shortness of breath.    Patient with history of dementia and so history was limited.  Supposed to be on home oxygen at 2 L/min.  Presented with shortness of breath.  Was found to have a loculated right-sided pleural effusion.  Hospitalized for further management.    Consultants: Cardiothoracic surgery.  Interventional radiology  Procedures: Right-sided chest tube placement  Antibiotics: Anti-infectives (From admission, onward)   Start     Dose/Rate Route Frequency Ordered Stop   09/19/20 1100  vancomycin (VANCOREADY) IVPB 750 mg/150 mL        750 mg 150 mL/hr over 60 Minutes Intravenous Every 24 hours 09/18/20 1255     09/18/20 1600  cefTRIAXone (ROCEPHIN) 2 g in sodium chloride 0.9 % 100 mL IVPB        2 g 200 mL/hr over 30 Minutes Intravenous Every 24 hours 09/18/20 1526 09/23/20 1559   09/18/20 1600  azithromycin (ZITHROMAX) 500 mg in sodium chloride 0.9 % 250 mL IVPB        500 mg 250 mL/hr over 60 Minutes Intravenous Every 24 hours 09/18/20 1526 09/23/20 1559   09/18/20 0945  vancomycin (VANCOREADY) IVPB 1250 mg/250 mL        1,250 mg 166.7 mL/hr over 90 Minutes Intravenous  Once 09/18/20 0932 09/18/20 1334   09/18/20 0930  ceFEPIme (MAXIPIME) 2 g in sodium chloride 0.9 % 100 mL IVPB        2 g 200 mL/hr over 30 Minutes Intravenous  Once 09/18/20 0926 09/18/20 1043      Subjective/Interval History: Patient remains pleasantly confused.  Noted to be more comfortable today compared to yesterday.  She denies any chest pain or shortness of breath at this time.      Assessment/Plan:  Loculated right-sided pleural effusion/severe sepsis present on  admission/acute respiratory failure with hypoxia Apparently has had some coughing episodes with eating and drinking raising concern for aspiration.  Due to loculated effusion cardiothoracic surgery was consulted.  Seen by Dr. Cyndia Bent.  Not thought to be a surgical candidate.  He recommended evaluation by IR.  Discussed with patient's son who was agreeable to procedures to drain this collection.  Patient underwent chest tube placement yesterday. WBC noted to be better today at 28,000.  Other labs are stable.  Respiratory status is stable. Patient remains on broad-spectrum antibiotics with vancomycin ceftriaxone and azithromycin.  Lactic acid level was 1.7.  Procalcitonin 0.81.  Follow-up on cultures from the blood as well as body fluid.  Cell counts from the pleural fluid show WBC to be 1265.  This makes empyema less likely. We will repeat chest x-ray today.  Waiting on cardiothoracic input. Wean down oxygen as tolerated to maintain sats greater than 90%.  Leukocytosis/normocytic anemia Some of this likely is reactive.  But likely also due to acute infection.  LDH is normal.  Chest pain and dyspnea Patient developed chest pain and shortness of breath yesterday.  EKG did not show any ischemic changes.  Most likely due to her pneumonia.  D-dimer was noted to be elevated which could be elevated due to her other acute issues.  Patient's symptoms improved after pleural effusion was drained.  No chest pain this morning.  We will continue to monitor for now without additional testing.   COPD with acute exacerbation Seems to have improved with steroids and nebulizer treatments.    Mildly elevated troponin Likely due to demand ischemia.  EKG without any acute changes.  Chronic systolic CHF Last echocardiogram showed a EF of 40 to 45%.  Does not appear to be volume overloaded.  .  Essential hypertension Continue home medications.  Monitor blood pressures.  Normocytic anemia Mild drop in hemoglobin is  likely dilutional.  No evidence of overt bleeding.  Continue to monitor.  Hyperkalemia Potassium was 5.6.  Noted to be 3.8 yesterday and 3.3 today.  We will give a dose of potassium today.    History of dementia Patient apparently has significant dementia per family.  Reorient daily.  Delirium precautions.  Hypothyroidism TSH noted to be normal.  Continue levothyroxine.  History of breast cancer Seems to be stable.  Noted to be on anastrozole.  History of GERD Continue Pepcid.  DVT Prophylaxis: Lovenox Code Status: DNR Family Communication: Son was updated yesterday.  We will do so again later today.   Disposition Plan: Hopefully return home when improved.  PT and OT evaluation.  Status is: Inpatient  Remains inpatient appropriate because:IV treatments appropriate due to intensity of illness or inability to take PO and Inpatient level of care appropriate due to severity of illness   Dispo: The patient is from: Home              Anticipated d/c is to: Home              Patient currently is not medically stable to d/c.   Difficult to place patient No       Medications:  Scheduled: . anastrozole  1 mg Oral Daily  . arformoterol  15 mcg Nebulization BID  . atorvastatin  40 mg Oral QHS  . budesonide (PULMICORT) nebulizer solution  0.5 mg Nebulization BID  . carvedilol  3.125 mg Oral BID WC  . enoxaparin (LOVENOX) injection  40 mg Subcutaneous Q24H  . famotidine  40 mg Oral QHS  . gabapentin  200 mg Oral BID  . ipratropium-albuterol  3 mL Nebulization TID  . levothyroxine  25 mcg Oral QAC breakfast  . memantine  10 mg Oral BID  . mirabegron ER  25 mg Oral Daily  . venlafaxine XR  150 mg Oral Q breakfast   Continuous: . azithromycin 500 mg (09/19/20 1759)  . cefTRIAXone (ROCEPHIN)  IV 2 g (09/19/20 1801)  . vancomycin 750 mg (09/20/20 1039)   NAT:FTDDUKGURKYHC, albuterol, morphine injection, ondansetron (ZOFRAN) IV, oxyCODONE   Objective:  Vital  Signs  Vitals:   09/20/20 0640 09/20/20 0716 09/20/20 0852 09/20/20 1055  BP: (!) 142/93 (!) 142/79  122/72  Pulse: (!) 109 98  95  Resp: 18   17  Temp: 97.8 F (36.6 C) 98 F (36.7 C)  98 F (36.7 C)  TempSrc: Oral Oral    SpO2: 97% 96% 95% 96%  Weight:      Height:        Intake/Output Summary (Last 24 hours) at 09/20/2020 1056 Last data filed at 09/20/2020 0921 Gross per 24 hour  Intake 240 ml  Output 1450 ml  Net -1210 ml   Filed Weights   09/18/20 0756 09/19/20 0046 09/20/20 0232  Weight: 55 kg 47.5 kg 50.9 kg    General appearance: Awake alert.  In no distress.  Pleasantly confused Resp: Chest tube noted over the  right chest area with yellowish fluid.  Diminished air entry at the right base.  Few crackles.  No wheezing appreciated. Cardio: S1-S2 is normal regular.  No S3-S4.  No rubs murmurs or bruit GI: Abdomen is soft.  Nontender nondistended.  Bowel sounds are present normal.  No masses organomegaly Extremities: No edema.  Full range of motion of lower extremities. Neurologic:   No focal neurological deficits.     Lab Results:  Data Reviewed: I have personally reviewed following labs and imaging studies  CBC: Recent Labs  Lab 09/18/20 0800 09/19/20 0729 09/20/20 0341  WBC 47.1* 39.8* 28.3*  NEUTROABS  --  36.5*  --   HGB 12.8 11.8* 10.8*  HCT 39.5 35.8* 32.4*  MCV 93.8 89.7 90.3  PLT 410* 400 [id number]    Basic Metabolic Panel: Recent Labs  Lab 09/18/20 0945 09/19/20 0729 09/20/20 0341  NA 135 139 138  K 5.6* 3.8 3.3*  CL 100 107 101  CO2 18* 23 26  GLUCOSE 104* 139* 123*  BUN 29* 25* 19  CREATININE 0.93 0.75 0.68  CALCIUM 9.2 8.7* 8.6*    GFR: Estimated Creatinine Clearance: 45.1 mL/min (by C-G formula based on SCr of 0.68 mg/dL).  Liver Function Tests: Recent Labs  Lab 09/18/20 0945  AST QUANTITY NOT SUFFICIENT, UNABLE TO PERFORM TEST  ALT 6  ALKPHOS 172*  BILITOT 0.1*  PROT 7.2  ALBUMIN 2.6*     Thyroid Function  Tests: Recent Labs    09/18/20 2147  TSH 1.077     Recent Results (from the past 240 hour(s))  Resp Panel by RT-PCR (Flu A&B, Covid) Nasopharyngeal Swab     Status: None   Collection Time: 09/18/20  8:00 AM   Specimen: Nasopharyngeal Swab; Nasopharyngeal(NP) swabs in vial transport medium  Result Value Ref Range Status   SARS Coronavirus 2 by RT PCR NEGATIVE NEGATIVE Final    Comment: (NOTE) SARS-CoV-2 target nucleic acids are NOT DETECTED.  The SARS-CoV-2 RNA is generally detectable in upper respiratory specimens during the acute phase of infection. The lowest concentration of SARS-CoV-2 viral copies this assay can detect is 138 copies/mL. A negative result does not preclude SARS-Cov-2 infection and should not be used as the sole basis for treatment or other patient management decisions. A negative result may occur with  improper specimen collection/handling, submission of specimen other than nasopharyngeal swab, presence of viral mutation(s) within the areas targeted by this assay, and inadequate number of viral copies(<138 copies/mL). A negative result must be combined with clinical observations, patient history, and epidemiological information. The expected result is Negative.  Fact Sheet for Patients:  EntrepreneurPulse.com.au  Fact Sheet for Healthcare Providers:  IncredibleEmployment.be  This test is no t yet approved or cleared by the [place] FDA and  has been authorized for detection and/or diagnosis of SARS-CoV-2 by FDA under an Emergency Use Authorization (EUA). This EUA will remain  in effect (meaning this test can be used) for the duration of the COVID-19 declaration under Section 564(b)(1) of the Act, 21 U.S.C.section 360bbb-3(b)(1), unless the authorization is terminated  or revoked sooner.       Influenza A by PCR NEGATIVE NEGATIVE Final   Influenza B by PCR NEGATIVE NEGATIVE Final    Comment: (NOTE) The Xpert  Xpress SARS-CoV-2/FLU/RSV plus assay is intended as an aid in the diagnosis of influenza from Nasopharyngeal swab specimens and should not be used as a sole basis for treatment. Nasal washings and aspirates are unacceptable for Xpert Xpress SARS-CoV-2/FLU/RSV  testing.  Fact Sheet for Patients: EntrepreneurPulse.com.au  Fact Sheet for Healthcare Providers: IncredibleEmployment.be  This test is not yet approved or cleared by the [place] FDA and has been authorized for detection and/or diagnosis of SARS-CoV-2 by FDA under an Emergency Use Authorization (EUA). This EUA will remain in effect (meaning this test can be used) for the duration of the COVID-19 declaration under Section 564(b)(1) of the Act, 21 U.S.C. section 360bbb-3(b)(1), unless the authorization is terminated or revoked.  Performed at [facility], [place] [address], [place], [place] [address]   Blood culture (routine x 2)     Status: None (Preliminary result)   Collection Time: 09/18/20  9:22 AM   Specimen: BLOOD  Result Value Ref Range Status   Specimen Description BLOOD BLOOD RIGHT FOREARM  Final   Special Requests   Final    BOTTLES DRAWN AEROBIC AND ANAEROBIC Blood Culture results may not be optimal due to an inadequate volume of blood received in culture bottles   Culture   Final    NO GROWTH 2 DAYS Performed at [facility], [place] [address], [place], [place] [address]    Report Status PENDING  Incomplete  Blood culture (routine x 2)     Status: None (Preliminary result)   Collection Time: 09/18/20  9:27 AM   Specimen: BLOOD  Result Value Ref Range Status   Specimen Description BLOOD RIGHT ANTECUBITAL  Final   Special Requests   Final    BOTTLES DRAWN AEROBIC AND ANAEROBIC Blood Culture results may not be optimal due to an inadequate volume of blood received in culture bottles   Culture   Final    NO GROWTH 2 DAYS Performed at [facility], [place]  [address] [place], [place] [address]    Report Status PENDING  Incomplete  Culture, body fluid w Gram Stain-bottle     Status: None (Preliminary result)   Collection Time: 09/19/20  4:46 PM   Specimen: Fluid  Result Value Ref Range Status   Specimen Description FLUID PLEURAL RIGHT  Final   Special Requests BOTTLES DRAWN AEROBIC AND ANAEROBIC  Final   Culture   Final    NO GROWTH < 12 HOURS Performed at [facility], [place] [address] [place], [place] [address]    Report Status PENDING  Incomplete  MRSA PCR Screening     Status: None   Collection Time: 09/20/20  4:30 AM   Specimen: Nasal Mucosa; Nasopharyngeal  Result Value Ref Range Status   MRSA by PCR NEGATIVE NEGATIVE Final    Comment:        The GeneXpert MRSA Assay (FDA approved for NASAL specimens only), is one component of a comprehensive MRSA colonization surveillance program. It is not intended to diagnose MRSA infection nor to guide or monitor treatment for MRSA infections. Performed at [facility], [place] [address], [place], [place] [address]       Radiology Studies: CT CHEST WO CONTRAST  Result Date: 09/18/2020 CLINICAL DATA:  Abnormal chest radiograph, pleural effusion EXAM: CT CHEST WITHOUT CONTRAST TECHNIQUE: Multidetector CT imaging of the chest was performed following the standard protocol without IV contrast. COMPARISON:  Chest radiograph dated 09/18/2020. CTA chest dated 09/27/2015. FINDINGS: Cardiovascular: The heart is top-normal in size. No pericardial effusion. No evidence of thoracic aortic aneurysm. Atherosclerotic calcifications of the aortic arch. Three vessel coronary atherosclerosis. Mediastinum/Nodes: Small mediastinal lymph nodes, likely reactive. Visualized thyroid is grossly unremarkable. Lungs/Pleura: Biapical pleural-parenchymal scarring.  Moderate centrilobular emphysematous changes, upper lung predominant. Moderate right pleural effusion, loculated. Associated right lower lobe compressive  atelectasis. No focal consolidation. No suspicious pulmonary nodules. No pneumothorax. Upper Abdomen: Visualized upper abdomen is notable for motion degradation, calcified splenic granulomata, and vascular calcifications. Musculoskeletal: Mild degenerative changes of the visualized thoracolumbar spine. IMPRESSION: Moderate right pleural effusion, loculated. Associated right lower lobe compressive atelectasis. Aortic Atherosclerosis (ICD10-I70.0) and Emphysema (ICD10-J43.9). Electronically Signed   By: Julian Hy M.D.   On: 09/18/2020 15:56   DG CHEST PORT 1 VIEW  Result Date: 09/19/2020 CLINICAL DATA:  Shortness of breath, dyspnea. EXAM: PORTABLE CHEST 1 VIEW COMPARISON:  September 18, 2020. FINDINGS: The heart size and mediastinal contours are within normal limits. No pneumothorax is noted. Left lung is clear. Continued presence of loculated right pleural effusion is noted with associated right basilar atelectasis or infiltrate. The visualized skeletal structures are unremarkable. IMPRESSION: Continued presence of loculated right pleural effusion with associated right basilar atelectasis or infiltrate. Electronically Signed   By: Marijo Conception M.D.   On: 09/19/2020 08:11   DG Swallowing Func-Speech Pathology  Result Date: 09/19/2020 Objective Swallowing Evaluation: Type of Study: MBS-Modified Barium Swallow Study  Patient Details Name: [name] MRN: [id number] Date of Birth: 1940-10-02 Today's Date: 09/19/2020 Time: SLP Start Time (ACUTE ONLY): 1306 -SLP Stop Time (ACUTE ONLY): 1327 SLP Time Calculation (min) (ACUTE ONLY): 21 min Past Medical History: Past Medical History: Diagnosis Date . Anxiety  . Arthritis  . Cancer [facility])   breast CA 2002 . CHF (congestive heart failure) ([place])  . COPD (chronic obstructive pulmonary disease) ([place])  . Dementia ([place])  . Hypercholesterolemia  . Hyperthyroidism  . Osteopenia  Past Surgical History: Past Surgical History: Procedure Laterality Date . ABDOMINAL  HYSTERECTOMY   . APPENDECTOMY   . BACK SURGERY   . BREAST SURGERY   . MASTECTOMY W/ SENTINEL NODE BIOPSY Left 02/26/2016  Procedure: LEFT MASTECTOMY WITH SENTINEL LYMPH NODE BIOPSY;  Surgeon: Coralie Keens, MD;  Location: [facility];  Service: General;  Laterality: Left; HPI: Pt is a 80 yo female presenting with SOB, admitted with sepsis secondary to R empyema with respiratory failure. CXR concerning for R-sided PNA with moderate pleural effusion. Family reported coughing while eating and talking. PMH includes: dementia, COPD, systolic CHF, hypothyroidism, breast ca s/p L mastectomy, recent fall  Subjective: alert, pleasant, needs repetition Assessment / Plan / Recommendation CHL IP CLINICAL IMPRESSIONS 09/19/2020 Clinical Impression Pt presents with a pharyngeal more than oral dysphagia with decreased airway protection across all liquids. She has reduced anterior hyoid movement, base of tongue retraction, and epiglottic inversion that results in airway invasion during the swallow, but also leaves increasing amounts of pharyngeal residue as boluses become thicker. Thin liquids are aspirated silently, and cannot be cleared with a cued cough. A chin tuck does NOT prevent aspiration. With nectar thick liquids she penetrates above the vocal folds under certain conditions, such as when she takes straw sips, large volumes via cup, or if her valleculae is full of nectar-thick residue that spills over the epiglottis as she is working on more prolonged mastication of solids. This penetration clears more readily with a cued cough or throat clear, and the remaining cup sips are consumed without entering the airway. Honey thick liquids are aspirated, perhaps in part due to the posterior head tilt that she uses to access them from the cup. Recommend starting wtih Dys 2 (finely chopped) foods and nectar thick liquids by cup. Would encourage small, single  sips and intermittent throat clears, although anticipate that pt will need  assistance for reinforcement.  SLP Visit Diagnosis Dysphagia, oropharyngeal phase (R13.12) Attention and concentration deficit following -- Frontal lobe and executive function deficit following -- Impact on safety and function Moderate aspiration risk   CHL IP TREATMENT RECOMMENDATION 09/19/2020 Treatment Recommendations Therapy as outlined in treatment plan below   Prognosis 09/19/2020 Prognosis for Safe Diet Advancement Fair Barriers to Reach Goals Cognitive deficits Barriers/Prognosis Comment -- CHL IP DIET RECOMMENDATION 09/19/2020 SLP Diet Recommendations Dysphagia 2 (Fine chop) solids;Nectar thick liquid Liquid Administration via Cup;No straw Medication Administration Crushed with puree Compensations Minimize environmental distractions;Slow rate;Small sips/bites;Clear throat intermittently Postural Changes Remain semi-upright after after feeds/meals (Comment);Seated upright at 90 degrees   CHL IP OTHER RECOMMENDATIONS 09/19/2020 Recommended Consults -- Oral Care Recommendations Oral care BID Other Recommendations Order thickener from pharmacy;Prohibited food (jello, ice cream, thin soups);Remove water pitcher   CHL IP FOLLOW UP RECOMMENDATIONS 09/19/2020 Follow up Recommendations Skilled Nursing facility   [facility] IP FREQUENCY AND DURATION 09/19/2020 Speech Therapy Frequency (ACUTE ONLY) min 2x/week Treatment Duration 2 weeks      CHL IP ORAL PHASE 09/19/2020 Oral Phase Impaired Oral - Pudding Teaspoon -- Oral - Pudding Cup -- Oral - Honey Teaspoon -- Oral - Honey Cup Lingual/palatal residue Oral - Nectar Teaspoon -- Oral - Nectar Cup WFL Oral - Nectar Straw WFL Oral - Thin Teaspoon -- Oral - Thin Cup WFL Oral - Thin Straw WFL Oral - Puree WFL Oral - Mech Soft Impaired mastication;Lingual/palatal residue Oral - Regular -- Oral - Multi-Consistency -- Oral - Pill -- Oral Phase - Comment --  CHL IP PHARYNGEAL PHASE 09/19/2020 Pharyngeal Phase -- Pharyngeal- Pudding Teaspoon -- Pharyngeal -- Pharyngeal- Pudding Cup --  Pharyngeal -- Pharyngeal- Honey Teaspoon -- Pharyngeal -- Pharyngeal- Honey Cup Reduced epiglottic inversion;Reduced anterior laryngeal mobility;Reduced tongue base retraction;Pharyngeal residue - valleculae;Penetration/Aspiration during swallow;Lateral channel residue Pharyngeal Material enters airway, passes BELOW cords then ejected out Pharyngeal- Nectar Teaspoon -- Pharyngeal -- Pharyngeal- Nectar Cup Reduced epiglottic inversion;Reduced anterior laryngeal mobility;Reduced tongue base retraction;Pharyngeal residue - valleculae;Penetration/Apiration after swallow;Penetration/Aspiration during swallow Pharyngeal Material enters airway, remains ABOVE vocal cords and not ejected out Pharyngeal- Nectar Straw Reduced epiglottic inversion;Reduced anterior laryngeal mobility;Reduced tongue base retraction;Pharyngeal residue - valleculae;Lateral channel residue;Penetration/Aspiration during swallow Pharyngeal Material enters airway, remains ABOVE vocal cords and not ejected out Pharyngeal- Thin Teaspoon -- Pharyngeal -- Pharyngeal- Thin Cup Reduced epiglottic inversion;Reduced anterior laryngeal mobility;Reduced tongue base retraction;Pharyngeal residue - valleculae;Penetration/Aspiration during swallow Pharyngeal Material enters airway, passes BELOW cords without attempt by patient to eject out (silent aspiration) Pharyngeal- Thin Straw Reduced epiglottic inversion;Reduced anterior laryngeal mobility;Reduced tongue base retraction;Pharyngeal residue - valleculae;Compensatory strategies attempted (with notebox);Penetration/Aspiration during swallow Pharyngeal Material enters airway, passes BELOW cords without attempt by patient to eject out (silent aspiration) Pharyngeal- Puree Reduced epiglottic inversion;Reduced anterior laryngeal mobility;Reduced tongue base retraction;Pharyngeal residue - valleculae Pharyngeal -- Pharyngeal- Mechanical Soft Reduced epiglottic inversion;Reduced anterior laryngeal mobility;Reduced  tongue base retraction;Pharyngeal residue - valleculae Pharyngeal -- Pharyngeal- Regular -- Pharyngeal -- Pharyngeal- Multi-consistency -- Pharyngeal -- Pharyngeal- Pill -- Pharyngeal -- Pharyngeal Comment --  CHL IP CERVICAL ESOPHAGEAL PHASE 09/19/2020 Cervical Esophageal Phase Impaired Pudding Teaspoon -- Pudding Cup -- Honey Teaspoon -- Honey Cup Reduced cricopharyngeal relaxation Nectar Teaspoon -- Nectar Cup Reduced cricopharyngeal relaxation Nectar Straw Reduced cricopharyngeal relaxation Thin Teaspoon -- Thin Cup Reduced cricopharyngeal relaxation Thin Straw Reduced cricopharyngeal relaxation Puree Reduced cricopharyngeal relaxation Mechanical Soft Reduced cricopharyngeal relaxation Regular -- Multi-consistency -- Pill -- Cervical  Esophageal Comment -- Osie Bond., M.A. CCC-SLP Acute Rehabilitation Services Pager [phone] Office [phone] 09/19/2020, 3:03 PM              IR PERC PLEURAL DRAIN W/[facility]WELL CATH W/IMG GUIDE  Result Date: 09/20/2020 INDICATION: Loculated right pleural effusion EXAM: ULTRASOUND AND FLUOROSCOPIC 10 FRENCH RIGHT CHEST TUBE INSERTION MEDICATIONS: The patient is currently admitted to the hospital and receiving intravenous antibiotics. The antibiotics were administered within an appropriate time frame prior to the initiation of the procedure. ANESTHESIA/SEDATION: Fentanyl 25 mcg IV; Versed 5 0 mg IV Moderate Sedation Time:  NONE. The patient was continuously monitored during the procedure by the interventional radiology nurse under my direct supervision. COMPLICATIONS: None immediate. PROCEDURE: Informed written consent was obtained from the PATIENT'S SON after a thorough discussion of the procedural risks, benefits and alternatives. All questions were addressed. Maximal Sterile Barrier Technique was utilized including caps, mask, sterile gowns, sterile gloves, sterile drape, hand hygiene and skin antiseptic. A timeout was performed prior to the initiation of the procedure.  Previous imaging reviewed. Patient positioned right anterior oblique. Complex septated loculated right effusion was localized in the mid axillary line through a lower intercostal space. Under sterile conditions and local anesthesia, an 18 gauge 10 cm access needle was advanced through a lower intercostal space under direct ultrasound into the effusion. Needle position confirmed with ultrasound. Images obtained for documentation. Guidewire inserted followed by tract dilatation to insert a 10 [place] drain. Drain catheter position confirmed with ultrasound and fluoroscopy. Images obtained for documentation. Serosanguineous yellow pleural fluid aspirated. Sample sent for laboratory analysis and culture. Catheter secured with a Prolene suture and connected to external pleura vac. Sterile dressing applied. No immediate complication. Patient tolerated the procedure well. IMPRESSION: Successful ultrasound and fluoroscopic 10 French right chest tube insertion. Electronically Signed   By: Jerilynn Mages.  Shick M.D.   On: 09/20/2020 08:27       LOS: 2 days   [facility] Pager on [email or web address]  09/20/2020, 10:56 AM

## 2020-09-20 NOTE — Progress Notes (Signed)
Referring Physician(s): Curly Rim  Supervising Physician: Jacqulynn Cadet  Patient Status:  [facility] - In-pt  Chief Complaint:  Dyspnea, loculated right pleural effusion  Subjective: Patient resting quietly in bed, states her breathing has improved somewhat since right chest drain placed; does have some soreness at right chest drain insertion site; asking when she will be discharged   Allergies: Patient has no known allergies.  Medications: Prior to Admission medications   Medication Sig Start Date End Date Taking? Authorizing Provider  acetaminophen (TYLENOL) 650 MG CR tablet Take 650 mg by mouth in the morning and at bedtime.   Yes [provider]  anastrozole (ARIMIDEX) 1 MG tablet Take 1 tablet (1 mg total) by mouth daily. 09/11/16  Yes Nicholas Lose, MD  atorvastatin (LIPITOR) 40 MG tablet Take 40 mg by mouth at bedtime.   Yes [provider]  budesonide-formoterol (SYMBICORT) 80-4.5 MCG/ACT inhaler Inhale 2 puffs into the lungs 2 (two) times daily.   Yes [provider]  Calcium Citrate-Vitamin D (CALCIUM CITRATE PETITE/VIT D PO) Take 1 tablet by mouth in the morning.   Yes [provider]  carvedilol (COREG) 3.125 MG tablet Take 3.125 mg by mouth 2 (two) times daily with a meal.   Yes [provider]  Cholecalciferol (VITAMIN D3) 50 MCG (2000 UT) TABS Take 2,000 Units by mouth in the morning.   Yes [provider]  famotidine (PEPCID) 40 MG tablet Take 40 mg by mouth at bedtime.   Yes [provider]  furosemide (LASIX) 20 MG tablet Take 20 mg by mouth in the morning. 11/14/15  Yes [provider]  gabapentin (NEURONTIN) 100 MG capsule Take 200 mg by mouth 2 (two) times daily.    Yes [provider]  levothyroxine (SYNTHROID, LEVOTHROID) 25 MCG tablet Take 25 mcg by mouth daily before breakfast.  01/23/16  Yes [provider]  memantine (NAMENDA) 10 MG tablet Take 1 tablet (10 mg total)  by mouth 2 (two) times daily. 03/17/16  Yes Penumalli, Earlean Polka, MD  mirabegron ER (MYRBETRIQ) 25 MG TB24 tablet Take 25 mg by mouth daily.   Yes [provider]  Multiple Vitamin (MULTIVITAMIN WITH MINERALS) TABS tablet Take 1 tablet by mouth daily.   Yes [provider]  OXYGEN Inhale 2 L/min into the lungs See admin instructions. 2 L/min at bedtime and an additional 2 L/min as needed for shortness of breath   Yes [provider]  potassium chloride SA (K-DUR,KLOR-CON) 20 MEQ tablet Take 20 mEq by mouth daily.  08/31/15  Yes [provider]  venlafaxine XR (EFFEXOR-XR) 150 MG 24 hr capsule Take 150 mg by mouth daily with breakfast.   Yes [provider]     Vital Signs: BP 122/72 (BP Location: Left Arm)   Pulse 95   Temp 98 F (36.7 C)   Resp 17   Ht 5\' 2"  (1.575 m)   Wt 112 lb 3.4 oz (50.9 kg)   SpO2 96%   BMI 20.52 kg/m   Physical Exam patient awake, in no acute distress.  Right chest drain intact, insertion site okay, mildly tender, chest drain to Pleur-evac and wall suction, output 400 cc hazy yellow fluid; no obvious air leak  Imaging: DG Chest 2 View  Result Date: 09/16/2020 CLINICAL DATA:  Lower right rib pain after a fall EXAM: CHEST - 2 VIEW COMPARISON:  09/27/2015 FINDINGS: Heart size and pulmonary vascularity are normal. Emphysematous changes and scattered fibrosis in the lungs. Calcified  granuloma in the right upper lung. Infiltration in the right lower lung posteriorly with small right pleural effusion, likely pneumonia. In the setting of trauma, this could also indicate pulmonary contusion. No displaced rib fractures are identified although rib views were not obtained. IMPRESSION: 1. Emphysematous changes and fibrosis in the lungs. 2. Infiltration in the right lower lung with small right pleural effusion, likely pneumonia. Electronically Signed   By: Lucienne Capers M.D.   On: 09/16/2020 23:07   CT Head Wo Contrast  Result  Date: 09/17/2020 CLINICAL DATA:  Mechanical fall. EXAM: CT HEAD WITHOUT CONTRAST CT CERVICAL SPINE WITHOUT CONTRAST TECHNIQUE: Multidetector CT imaging of the head and cervical spine was performed following the standard protocol without intravenous contrast. Multiplanar CT image reconstructions of the cervical spine were also generated. COMPARISON:  None. FINDINGS: CT HEAD FINDINGS Brain: Diffuse cerebral atrophy. Ventricular dilatation likely due to central atrophy. Low-attenuation changes in the deep white matter consistent with small vessel ischemia. No mass-effect or midline shift. No abnormal extra-axial fluid collections. 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 Basal cisterns are not effaced. No acute intracranial hemorrhage. Vascular: Moderate intracranial arterial vascular calcifications. Skull: The calvarium appears intact. Sinuses/Orbits: Paranasal sinuses and mastoid air cells are clear. Other: None. CT CERVICAL SPINE FINDINGS Alignment: Normal alignment. Skull base and vertebrae: No acute fracture. No primary bone lesion or focal pathologic process. Soft tissues and spinal canal: No prevertebral fluid or swelling. No visible canal hematoma. Disc levels: Degenerative changes with disc space narrowing and endplate hypertrophic changes throughout. Changes are most prominent at C4-5 and C5-6 levels. Prominent disc osteophyte complex at C5-6 level. Upper chest: Emphysematous changes and scarring in the lung apices. Other: None. IMPRESSION: 1. No acute intracranial abnormalities. Chronic atrophy and small vessel ischemic changes. 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 2. Normal alignment of the cervical spine. Degenerative changes. No acute displaced fractures identified. 3. Emphysematous changes and scarring in the lung apices. Emphysema (ICD10-J43.9). Electronically Signed   By: Lucienne Capers M.D.    On: 09/17/2020 00:29   CT CHEST WO CONTRAST  Result Date: 09/18/2020 CLINICAL DATA:  Abnormal chest radiograph, pleural effusion EXAM: CT CHEST WITHOUT CONTRAST TECHNIQUE: Multidetector CT imaging of the chest was performed following the standard protocol without IV contrast. COMPARISON:  Chest radiograph dated 09/18/2020. CTA chest dated 09/27/2015. FINDINGS: Cardiovascular: The heart is top-normal in size. No pericardial effusion. No evidence of thoracic aortic aneurysm. Atherosclerotic calcifications of the aortic arch. Three vessel coronary atherosclerosis. Mediastinum/Nodes: Small mediastinal lymph nodes, likely reactive. Visualized thyroid is grossly unremarkable. Lungs/Pleura: Biapical pleural-parenchymal scarring. Moderate centrilobular emphysematous changes, upper lung predominant. Moderate right pleural effusion, loculated. Associated right lower lobe compressive atelectasis. No focal consolidation. No suspicious pulmonary nodules. No pneumothorax. Upper Abdomen: Visualized upper abdomen is notable for motion degradation, calcified splenic granulomata, and vascular calcifications. Musculoskeletal: Mild degenerative changes of the visualized thoracolumbar spine. IMPRESSION: Moderate right pleural effusion, loculated. Associated right lower lobe compressive atelectasis. Aortic Atherosclerosis (ICD10-I70.0) and Emphysema (ICD10-J43.9). Electronically Signed   By: Julian Hy M.D.   On: 09/18/2020 15:56   CT Cervical Spine Wo Contrast  Result Date: 09/17/2020 CLINICAL DATA:  Mechanical fall. EXAM: CT HEAD WITHOUT CONTRAST CT CERVICAL SPINE WITHOUT CONTRAST TECHNIQUE: Multidetector CT imaging of the head and cervical spine was performed following the standard protocol without intravenous contrast. Multiplanar CT image reconstructions of the cervical spine were also generated. COMPARISON:  None. FINDINGS: CT  HEAD FINDINGS Brain: Diffuse cerebral atrophy. Ventricular dilatation likely due to central  atrophy. Low-attenuation changes in the deep white matter consistent with small vessel ischemia. No mass-effect or midline shift. No abnormal extra-axial fluid collections. 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 Basal cisterns are not effaced. No acute intracranial hemorrhage. Vascular: Moderate intracranial arterial vascular calcifications. Skull: The calvarium appears intact. Sinuses/Orbits: Paranasal sinuses and mastoid air cells are clear. Other: None. CT CERVICAL SPINE FINDINGS Alignment: Normal alignment. Skull base and vertebrae: No acute fracture. No primary bone lesion or focal pathologic process. Soft tissues and spinal canal: No prevertebral fluid or swelling. No visible canal hematoma. Disc levels: Degenerative changes with disc space narrowing and endplate hypertrophic changes throughout. Changes are most prominent at C4-5 and C5-6 levels. Prominent disc osteophyte complex at C5-6 level. Upper chest: Emphysematous changes and scarring in the lung apices. Other: None. IMPRESSION: 1. No acute intracranial abnormalities. Chronic atrophy and small vessel ischemic changes. 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 2. Normal alignment of the cervical spine. Degenerative changes. No acute displaced fractures identified. 3. Emphysematous changes and scarring in the lung apices. Emphysema (ICD10-J43.9). Electronically Signed   By: Lucienne Capers M.D.   On: 09/17/2020 00:29   DG CHEST PORT 1 VIEW  Result Date: 09/19/2020 CLINICAL DATA:  Shortness of breath, dyspnea. EXAM: PORTABLE CHEST 1 VIEW COMPARISON:  September 18, 2020. FINDINGS: The heart size and mediastinal contours are within normal limits. No pneumothorax is noted. Left lung is clear. Continued presence of loculated right pleural effusion is noted with associated right basilar atelectasis or infiltrate. The visualized skeletal structures are  unremarkable. IMPRESSION: Continued presence of loculated right pleural effusion with associated right basilar atelectasis or infiltrate. Electronically Signed   By: Marijo Conception M.D.   On: 09/19/2020 08:11   DG Chest Port 1 View  Result Date: 09/18/2020 CLINICAL DATA:  Shortness of breath. EXAM: PORTABLE CHEST 1 VIEW COMPARISON:  09/16/2020. FINDINGS: Heart size normal. Right base infiltrate with moderate right pleural effusion. Calcified nodule right upper lung again noted consistent calcified granuloma. No pneumothorax. No acute bony abnormality. Splenic calcifications consistent granulomas again noted. IMPRESSION: Right base infiltrate with moderate right pleural effusion. Electronically Signed   By: Marcello Moores  Register   On: 09/18/2020 08:21   DG Swallowing Func-Speech Pathology  Result Date: 09/19/2020 Objective Swallowing Evaluation: Type of Study: MBS-Modified Barium Swallow Study  Patient Details Name: [name] MRN: [id number] Date of Birth: 1940/06/10 Today's Date: 09/19/2020 Time: SLP Start Time (ACUTE ONLY): 5809 -SLP Stop Time (ACUTE ONLY): 1327 SLP Time Calculation (min) (ACUTE ONLY): 21 min Past Medical History: Past Medical History: Diagnosis Date . Anxiety  . Arthritis  . Cancer [facility])   breast CA 2002 . CHF (congestive heart failure) ([place])  . COPD (chronic obstructive pulmonary disease) ([place])  . Dementia ([place])  . Hypercholesterolemia  . Hyperthyroidism  . Osteopenia  Past Surgical History: Past Surgical History: Procedure Laterality Date . ABDOMINAL HYSTERECTOMY   . APPENDECTOMY   . BACK SURGERY   . BREAST SURGERY   . MASTECTOMY W/ SENTINEL NODE BIOPSY Left 02/26/2016  Procedure: LEFT MASTECTOMY WITH SENTINEL LYMPH NODE BIOPSY;  Surgeon: Coralie Keens, MD;  Location: [place];  Service: General;  Laterality: Left; HPI: Pt is a 80 yo female presenting with SOB, admitted with sepsis secondary to R empyema with respiratory failure. CXR concerning for R-sided PNA with moderate pleural effusion.  Family  reported coughing while eating and talking. PMH includes: dementia, COPD, systolic CHF, hypothyroidism, breast ca s/p L mastectomy, recent fall  Subjective: alert, pleasant, needs repetition Assessment / Plan / Recommendation CHL IP CLINICAL IMPRESSIONS 09/19/2020 Clinical Impression Pt presents with a pharyngeal more than oral dysphagia with decreased airway protection across all liquids. She has reduced anterior hyoid movement, base of tongue retraction, and epiglottic inversion that results in airway invasion during the swallow, but also leaves increasing amounts of pharyngeal residue as boluses become thicker. Thin liquids are aspirated silently, and cannot be cleared with a cued cough. A chin tuck does NOT prevent aspiration. With nectar thick liquids she penetrates above the vocal folds under certain conditions, such as when she takes straw sips, large volumes via cup, or if her valleculae is full of nectar-thick residue that spills over the epiglottis as she is working on more prolonged mastication of solids. This penetration clears more readily with a cued cough or throat clear, and the remaining cup sips are consumed without entering the airway. Honey thick liquids are aspirated, perhaps in part due to the posterior head tilt that she uses to access them from the cup. Recommend starting wtih Dys 2 (finely chopped) foods and nectar thick liquids by cup. Would encourage small, single sips and intermittent throat clears, although anticipate that pt will need assistance for reinforcement.  SLP Visit Diagnosis Dysphagia, oropharyngeal phase (R13.12) Attention and concentration deficit following -- Frontal lobe and executive function deficit following -- Impact on safety and function Moderate aspiration risk   CHL IP TREATMENT RECOMMENDATION 09/19/2020 Treatment Recommendations Therapy as outlined in treatment plan below   Prognosis 09/19/2020 Prognosis for Safe Diet Advancement Fair Barriers to Reach Goals  Cognitive deficits Barriers/Prognosis Comment -- CHL IP DIET RECOMMENDATION 09/19/2020 SLP Diet Recommendations Dysphagia 2 (Fine chop) solids;Nectar thick liquid Liquid Administration via Cup;No straw Medication Administration Crushed with puree Compensations Minimize environmental distractions;Slow rate;Small sips/bites;Clear throat intermittently Postural Changes Remain semi-upright after after feeds/meals (Comment);Seated upright at 90 degrees   CHL IP OTHER RECOMMENDATIONS 09/19/2020 Recommended Consults -- Oral Care Recommendations Oral care BID Other Recommendations Order thickener from pharmacy;Prohibited food (jello, ice cream, thin soups);Remove water pitcher   CHL IP FOLLOW UP RECOMMENDATIONS 09/19/2020 Follow up Recommendations Skilled Nursing facility   [facility] IP FREQUENCY AND DURATION 09/19/2020 Speech Therapy Frequency (ACUTE ONLY) min 2x/week Treatment Duration 2 weeks      CHL IP ORAL PHASE 09/19/2020 Oral Phase Impaired Oral - Pudding Teaspoon -- Oral - Pudding Cup -- Oral - Honey Teaspoon -- Oral - Honey Cup Lingual/palatal residue Oral - Nectar Teaspoon -- Oral - Nectar Cup WFL Oral - Nectar Straw WFL Oral - Thin Teaspoon -- Oral - Thin Cup WFL Oral - Thin Straw WFL Oral - Puree WFL Oral - Mech Soft Impaired mastication;Lingual/palatal residue Oral - Regular -- Oral - Multi-Consistency -- Oral - Pill -- Oral Phase - Comment --  CHL IP PHARYNGEAL PHASE 09/19/2020 Pharyngeal Phase -- Pharyngeal- Pudding Teaspoon -- Pharyngeal -- Pharyngeal- Pudding Cup -- Pharyngeal -- Pharyngeal- Honey Teaspoon -- Pharyngeal -- Pharyngeal- Honey Cup Reduced epiglottic inversion;Reduced anterior laryngeal mobility;Reduced tongue base retraction;Pharyngeal residue - valleculae;Penetration/Aspiration during swallow;Lateral channel residue Pharyngeal Material enters airway, passes BELOW cords then ejected out Pharyngeal- Nectar Teaspoon -- Pharyngeal -- Pharyngeal- Nectar Cup Reduced epiglottic inversion;Reduced anterior  laryngeal mobility;Reduced tongue base retraction;Pharyngeal residue - valleculae;Penetration/Apiration after swallow;Penetration/Aspiration during swallow Pharyngeal Material enters airway, remains ABOVE vocal cords and not ejected out Pharyngeal- Nectar Straw Reduced epiglottic inversion;Reduced anterior  laryngeal mobility;Reduced tongue base retraction;Pharyngeal residue - valleculae;Lateral channel residue;Penetration/Aspiration during swallow Pharyngeal Material enters airway, remains ABOVE vocal cords and not ejected out Pharyngeal- Thin Teaspoon -- Pharyngeal -- Pharyngeal- Thin Cup Reduced epiglottic inversion;Reduced anterior laryngeal mobility;Reduced tongue base retraction;Pharyngeal residue - valleculae;Penetration/Aspiration during swallow Pharyngeal Material enters airway, passes BELOW cords without attempt by patient to eject out (silent aspiration) Pharyngeal- Thin Straw Reduced epiglottic inversion;Reduced anterior laryngeal mobility;Reduced tongue base retraction;Pharyngeal residue - valleculae;Compensatory strategies attempted (with notebox);Penetration/Aspiration during swallow Pharyngeal Material enters airway, passes BELOW cords without attempt by patient to eject out (silent aspiration) Pharyngeal- Puree Reduced epiglottic inversion;Reduced anterior laryngeal mobility;Reduced tongue base retraction;Pharyngeal residue - valleculae Pharyngeal -- Pharyngeal- Mechanical Soft Reduced epiglottic inversion;Reduced anterior laryngeal mobility;Reduced tongue base retraction;Pharyngeal residue - valleculae Pharyngeal -- Pharyngeal- Regular -- Pharyngeal -- Pharyngeal- Multi-consistency -- Pharyngeal -- Pharyngeal- Pill -- Pharyngeal -- Pharyngeal Comment --  CHL IP CERVICAL ESOPHAGEAL PHASE 09/19/2020 Cervical Esophageal Phase Impaired Pudding Teaspoon -- Pudding Cup -- Honey Teaspoon -- Honey Cup Reduced cricopharyngeal relaxation Nectar Teaspoon -- Nectar Cup Reduced cricopharyngeal relaxation Nectar  Straw Reduced cricopharyngeal relaxation Thin Teaspoon -- Thin Cup Reduced cricopharyngeal relaxation Thin Straw Reduced cricopharyngeal relaxation Puree Reduced cricopharyngeal relaxation Mechanical Soft Reduced cricopharyngeal relaxation Regular -- Multi-consistency -- Pill -- Cervical Esophageal Comment -- Osie Bond., M.A. CCC-SLP Acute Rehabilitation Services Pager [phone] Office [phone] 09/19/2020, 3:03 PM              IR PERC PLEURAL DRAIN W/INDWELL CATH W/IMG GUIDE  Result Date: 09/20/2020 INDICATION: Loculated right pleural effusion EXAM: ULTRASOUND AND FLUOROSCOPIC 10 FRENCH RIGHT CHEST TUBE INSERTION MEDICATIONS: The patient is currently admitted to the hospital and receiving intravenous antibiotics. The antibiotics were administered within an appropriate time frame prior to the initiation of the procedure. ANESTHESIA/SEDATION: Fentanyl 25 mcg IV; Versed 5 0 mg IV Moderate Sedation Time:  NONE. The patient was continuously monitored during the procedure by the interventional radiology nurse under my direct supervision. COMPLICATIONS: None immediate. PROCEDURE: Informed written consent was obtained from the PATIENT'S SON after a thorough discussion of the procedural risks, benefits and alternatives. All questions were addressed. Maximal Sterile Barrier Technique was utilized including caps, mask, sterile gowns, sterile gloves, sterile drape, hand hygiene and skin antiseptic. A timeout was performed prior to the initiation of the procedure. Previous imaging reviewed. Patient positioned right anterior oblique. Complex septated loculated right effusion was localized in the mid axillary line through a lower intercostal space. Under sterile conditions and local anesthesia, an 18 gauge 10 cm access needle was advanced through a lower intercostal space under direct ultrasound into the effusion. Needle position confirmed with ultrasound. Images obtained for documentation. Guidewire inserted followed by  tract dilatation to insert a 10 [place] drain. Drain catheter position confirmed with ultrasound and fluoroscopy. Images obtained for documentation. Serosanguineous yellow pleural fluid aspirated. Sample sent for laboratory analysis and culture. Catheter secured with a Prolene suture and connected to external pleura vac. Sterile dressing applied. No immediate complication. Patient tolerated the procedure well. IMPRESSION: Successful ultrasound and fluoroscopic 10 French right chest tube insertion. Electronically Signed   By: Jerilynn Mages.  Shick M.D.   On: 09/20/2020 08:27    Labs:  CBC: Recent Labs    09/18/20 0800 09/19/20 0729 09/20/20 0341  WBC 47.1* 39.8* 28.3*  HGB 12.8 11.8* 10.8*  HCT 39.5 35.8* 32.4*  PLT 410* 400 336    COAGS: No results for input(s): INR, APTT in the last 8760 hours.  BMP: Recent Labs  09/18/20 0945 09/19/20 0729 09/20/20 0341  NA 135 139 138  K 5.6* 3.8 3.3*  CL 100 107 101  CO2 18* 23 26  GLUCOSE 104* 139* 123*  BUN 29* 25* 19  CALCIUM 9.2 8.7* 8.6*  CREATININE 0.93 0.75 0.68  GFRNONAA >60 >60 >60    LIVER FUNCTION TESTS: Recent Labs    09/18/20 0945  BILITOT 0.1*  AST QUANTITY NOT SUFFICIENT, UNABLE TO PERFORM TEST  ALT 6  ALKPHOS 172*  PROT 7.2  ALBUMIN 2.6*    Assessment and Plan: Patient with history of COPD, dementia, CHF, breast cancer, loculated right pleural effusion;right chest drain placement 4/27; afebrile, chest x-ray pending today;  potassium 3.3, creatinine normal, WBC 28.3 down from 39.8, hemoglobin 10.8 down from 11.8, pleural fluid cultures negative to date; continue current treatment for now, await follow-up chest x-ray findings; monitor drain output closely; follow-up on culture data; additional plans as per TRH/TCTS   Electronically Signed: D. Rowe Robert, PA-C 09/20/2020, 11:43 AM   I spent a total of 15 minutes at the the patient's bedside AND on the patient's hospital floor or unit, greater than 50% of which was  counseling/coordinating care for right chest drain    Patient ID: [name], female   DOB: [birth year], 80 y.o.   MRN: [id number]

## 2020-09-20 NOTE — Progress Notes (Signed)
  Speech Language Pathology Treatment: Dysphagia  Patient Details Name: [name] MRN: [id number] DOB: 17-Apr-1941 Today's Date: 09/20/2020 Time: 9417-4081 SLP Time Calculation (min) (ACUTE ONLY): 11 min  Assessment / Plan / Recommendation Clinical Impression  Pt has no recollection of swallow study on previous date, and needs consistent cues to implement swallowing strategies including small, single bites/sips and intermittent throat clearing. She also has a straw in the cup on her bedside table. With removal of straw and use of cues, pt consumed trials of current diet texture without overt s/s of aspiration. However, it should also be noted that aspiration on MBS was primarily silent (coughing was noted, but not directly correlated with any aspiration). Would continue to follow current diet and strategies carefully to try to reduce risk as much as possible. Could also consider palliative care conversation given dysphagia in the setting of dementia.   HPI HPI: Pt is a 80 yo female presenting with SOB, admitted with sepsis secondary to R empyema with respiratory failure. CXR concerning for R-sided PNA with moderate pleural effusion. Family reported coughing while eating and talking. PMH includes: dementia, COPD, systolic CHF, hypothyroidism, breast ca s/p L mastectomy, recent fall      SLP Plan  Continue with current plan of care       Recommendations  Diet recommendations: Dysphagia 2 (fine chop);Nectar-thick liquid Liquids provided via: Cup;No straw Medication Administration: Crushed with puree Supervision: Patient able to self feed;Full supervision/cueing for compensatory strategies Compensations: Minimize environmental distractions;Slow rate;Small sips/bites;Clear throat intermittently Postural Changes and/or Swallow Maneuvers: Seated upright 90 degrees;Upright 30-60 min after meal                Oral Care Recommendations: Oral care QID Follow up Recommendations: Skilled  Nursing facility SLP Visit Diagnosis: Dysphagia, oropharyngeal phase (R13.12) Plan: Continue with current plan of care       GO                Osie Bond., M.A. [place] [facility] Pager [phone] Office [phone]  09/20/2020, 9:51 AM

## 2020-09-21 ENCOUNTER — Inpatient Hospital Stay (HOSPITAL_COMMUNITY): Payer: Medicare Other

## 2020-09-21 DIAGNOSIS — J189 Pneumonia, unspecified organism: Secondary | ICD-10-CM | POA: Diagnosis not present

## 2020-09-21 DIAGNOSIS — Z938 Other artificial opening status: Secondary | ICD-10-CM

## 2020-09-21 DIAGNOSIS — J690 Pneumonitis due to inhalation of food and vomit: Secondary | ICD-10-CM | POA: Diagnosis not present

## 2020-09-21 DIAGNOSIS — J90 Pleural effusion, not elsewhere classified: Secondary | ICD-10-CM

## 2020-09-21 DIAGNOSIS — F0391 Unspecified dementia with behavioral disturbance: Secondary | ICD-10-CM | POA: Diagnosis not present

## 2020-09-21 LAB — CBC
HCT: 35.3 % — ABNORMAL LOW (ref 36.0–46.0)
Hemoglobin: 11.3 g/dL — ABNORMAL LOW (ref 12.0–15.0)
MCH: 29.5 pg (ref 26.0–34.0)
MCHC: 32 g/dL (ref 30.0–36.0)
MCV: 92.2 fL (ref 80.0–100.0)
Platelets: 362 10*3/uL (ref 150–400)
RBC: 3.83 MIL/uL — ABNORMAL LOW (ref 3.87–5.11)
RDW: 13.3 % (ref 11.5–15.5)
WBC: 12.9 10*3/uL — ABNORMAL HIGH (ref 4.0–10.5)
nRBC: 0 % (ref 0.0–0.2)

## 2020-09-21 LAB — BASIC METABOLIC PANEL
Anion gap: 10 (ref 5–15)
BUN: 16 mg/dL (ref 8–23)
CO2: 26 mmol/L (ref 22–32)
Calcium: 8.6 mg/dL — ABNORMAL LOW (ref 8.9–10.3)
Chloride: 102 mmol/L (ref 98–111)
Creatinine, Ser: 0.57 mg/dL (ref 0.44–1.00)
GFR, Estimated: >60 mL/min (ref >60)
Glucose, Bld: 124 mg/dL — ABNORMAL HIGH (ref 70–99)
Potassium: 3.5 mmol/L (ref 3.5–5.1)
Sodium: 138 mmol/L (ref 135–145)

## 2020-09-21 LAB — LEGIONELLA PNEUMOPHILA SEROGP 1 UR AG: L. pneumophila Serogp 1 Ur Ag: NEGATIVE

## 2020-09-21 LAB — PROCALCITONIN: Procalcitonin: 0.49 ng/mL

## 2020-09-21 MED ORDER — SODIUM CHLORIDE (PF) 0.9 % IJ SOLN
10.0000 mg | Freq: Two times a day (BID) | INTRAMUSCULAR | Status: DC
  Administered 2020-09-21 – 2020-09-23 (×5): 10 mg via INTRAPLEURAL
  Filled 2020-09-21 (×6): qty 10

## 2020-09-21 MED ORDER — STERILE WATER FOR INJECTION IJ SOLN
5.0000 mg | Freq: Two times a day (BID) | RESPIRATORY_TRACT | Status: DC
  Administered 2020-09-21 – 2020-09-23 (×5): 5 mg via INTRAPLEURAL
  Filled 2020-09-21 (×6): qty 5

## 2020-09-21 MED ORDER — KETOROLAC TROMETHAMINE 15 MG/ML IJ SOLN
15.0000 mg | Freq: Once | INTRAMUSCULAR | Status: AC
  Administered 2020-09-21: 15 mg via INTRAVENOUS
  Filled 2020-09-21: qty 1

## 2020-09-21 NOTE — Progress Notes (Signed)
[facility] PROGRESS NOTE   [name] ZOX:[id number] DOB: 1940-12-06 DOA: 09/18/2020  PCP: Farris Has, MD  Brief History/Interval Summary: 80 y.o. female with medical history significant of COPD,  chronic systolic CHF 40-45%, hypothyroidism, breast cancer s/p left mastectomy presents from [facility] due to shortness of breath.    Patient with history of dementia and so history was limited.  Supposed to be on home oxygen at 2 L/min.  Presented with shortness of breath.  Was found to have a loculated right-sided pleural effusion.  Hospitalized for further management.    Consultants: Cardiothoracic surgery.  Interventional radiology  Procedures: Right-sided chest tube placement  Antibiotics: Anti-infectives (From admission, onward)   Start     Dose/Rate Route Frequency Ordered Stop   09/19/20 1100  vancomycin (VANCOREADY) IVPB 750 mg/150 mL        750 mg 150 mL/hr over 60 Minutes Intravenous Every 24 hours 09/18/20 1255     09/18/20 1600  cefTRIAXone (ROCEPHIN) 2 g in sodium chloride 0.9 % 100 mL IVPB        2 g 200 mL/hr over 30 Minutes Intravenous Every 24 hours 09/18/20 1526 09/23/20 1559   09/18/20 1600  azithromycin (ZITHROMAX) 500 mg in sodium chloride 0.9 % 250 mL IVPB        500 mg 250 mL/hr over 60 Minutes Intravenous Every 24 hours 09/18/20 1526 09/23/20 1559   09/18/20 0945  vancomycin (VANCOREADY) IVPB 1250 mg/250 mL        1,250 mg 166.7 mL/hr over 90 Minutes Intravenous  Once 09/18/20 0932 09/18/20 1334   09/18/20 0930  ceFEPIme (MAXIPIME) 2 g in sodium chloride 0.9 % 100 mL IVPB        2 g 200 mL/hr over 30 Minutes Intravenous  Once 09/18/20 0926 09/18/20 1043      Subjective/Interval History: Apparently overnight patient was experiencing discomfort at the chest tube insertion site.  This morning she denies any pain.  Denies any new complaints.  Remains distracted.     Assessment/Plan:  Loculated right-sided pleural effusion/severe sepsis  present on admission/acute respiratory failure with hypoxia Apparently has had some coughing episodes with eating and drinking raising concern for aspiration.  Due to loculated effusion cardiothoracic surgery was consulted.  Seen by Dr. Laneta Simmers.  Not thought to be a surgical candidate.  He recommended evaluation by IR.  Discussed with patient's son who was agreeable to procedures to drain this collection.  Patient underwent chest tube placement on 4/27. Gram stain did not show any organisms.  Total WBC was 1264.  Cultures are pending.  Patient remains on vancomycin ceftriaxone and azithromycin.  Procalcitonin was 0.81 improved to 0.49 today.  WBC has improved to 12.9. Patient not seen by cardiothoracic service yesterday.  Chest x-ray showed stable findings.  No significant change in opacity noted after repeat chest tube was placed.  We will request pulmonology to assist with management. Continues to require 3 to 4 L of oxygen.  Wean down to keep saturations 90% or above. Will consider de-escalating antibiotics based on culture data.  MRSA PCR was negative so it may be safe to discontinue the vancomycin.  Chest pain and dyspnea Patient developed chest pain and shortness of breath yesterday.  EKG did not show any ischemic changes.  Most likely due to her pneumonia.  D-dimer was noted to be elevated which could be elevated due to her other acute issues.  Patient's symptoms improved after pleural effusion was drained.  No chest pain  this morning.  We will continue to monitor for now without additional testing.    COPD with acute exacerbation Seems to have improved with steroids and nebulizer treatments.    Mildly elevated troponin Likely due to demand ischemia.  EKG without any acute changes.  Normocytic anemia Hemoglobin is stable.  No evidence of overt blood loss.  Chronic systolic CHF Last echocardiogram showed a EF of 40 to 45%.  Does not appear to be volume overloaded.   Essential  hypertension Continue home medications.  Monitor blood pressures.  Hyperkalemia Potassium was 5.6 at admission.  Improved to.  Noted to be 3.8 yesterday and 3.3 today.  We will give a dose of potassium today.    History of dementia Patient apparently has significant dementia per family.  Reorient daily.  Delirium precautions.  Hypothyroidism TSH noted to be normal.  Continue levothyroxine.  History of breast cancer Seems to be stable.  Noted to be on anastrozole.  History of GERD Continue Pepcid.  DVT Prophylaxis: Lovenox Code Status: DNR Family Communication: Son was updated yesterday.  We will do so again later today.   Disposition Plan: Hopefully return home when improved.  PT and OT evaluation.  Status is: Inpatient  Remains inpatient appropriate because:IV treatments appropriate due to intensity of illness or inability to take PO and Inpatient level of care appropriate due to severity of illness   Dispo: The patient is from: Home              Anticipated d/c is to: Home              Patient currently is not medically stable to d/c.   Difficult to place patient No       Medications:  Scheduled: . anastrozole  1 mg Oral Daily  . arformoterol  15 mcg Nebulization BID  . atorvastatin  40 mg Oral QHS  . budesonide (PULMICORT) nebulizer solution  0.5 mg Nebulization BID  . carvedilol  3.125 mg Oral BID WC  . enoxaparin (LOVENOX) injection  40 mg Subcutaneous Q24H  . famotidine  40 mg Oral QHS  . gabapentin  200 mg Oral BID  . ipratropium-albuterol  3 mL Nebulization TID  . levothyroxine  25 mcg Oral QAC breakfast  . memantine  10 mg Oral BID  . mirabegron ER  25 mg Oral Daily  . venlafaxine XR  150 mg Oral Q breakfast   Continuous: . azithromycin 500 mg (09/20/20 1611)  . cefTRIAXone (ROCEPHIN)  IV 2 g (09/20/20 1611)  . vancomycin 150 mL/hr at 09/20/20 1843   [id number], albuterol, morphine injection, ondansetron (ZOFRAN) IV, oxyCODONE, Resource  ThickenUp Clear   Objective:  Vital Signs  Vitals:   09/21/20 0017 09/21/20 0456 09/21/20 0502 09/21/20 0854  BP: 124/70 140/78    Pulse: 91 94    Resp: 16 16    Temp: 97.9 F (36.6 C) 98.4 F (36.9 C)    TempSrc: Oral Oral    SpO2: 97% 97%  96%  Weight:   50.9 kg   Height:        Intake/Output Summary (Last 24 hours) at 09/21/2020 1030 Last data filed at 09/21/2020 0906 Gross per 24 hour  Intake 1992.5 ml  Output 350 ml  Net 1642.5 ml   Filed Weights   09/19/20 0046 09/20/20 0232 09/21/20 0502  Weight: 47.5 kg 50.9 kg 50.9 kg    General appearance: Awake alert.  In no distress.  Pleasantly confused Resp: Chest tube noted over the  right chest area with yellowish fluid.  Diminished air entry at the right base.  Few crackles.  No wheezing appreciated. Cardio: S1-S2 is normal regular.  No S3-S4.  No rubs murmurs or bruit GI: Abdomen is soft.  Nontender nondistended.  Bowel sounds are present normal.  No masses organomegaly Extremities: No edema.  Full range of motion of lower extremities. Neurologic:   No focal neurological deficits.     Lab Results:  Data Reviewed: I have personally reviewed following labs and imaging studies  CBC: Recent Labs  Lab 09/18/20 0800 09/19/20 0729 09/20/20 0341 09/21/20 0410  WBC 47.1* 39.8* 28.3* 12.9*  NEUTROABS  --  36.5*  --   --   HGB 12.8 11.8* 10.8* 11.3*  HCT 39.5 35.8* 32.4* 35.3*  MCV 93.8 89.7 90.3 92.2  PLT 410* 400 336 630    Basic Metabolic Panel: Recent Labs  Lab 09/18/20 0945 09/19/20 0729 09/20/20 0341 09/21/20 0410  NA 135 139 138 138  K 5.6* 3.8 3.3* 3.5  CL 100 107 101 102  CO2 18* 23 26 26   GLUCOSE 104* 139* 123* 124*  BUN 29* 25* 19 16  CREATININE 0.93 0.75 0.68 0.57  CALCIUM 9.2 8.7* 8.6* 8.6*    GFR: Estimated Creatinine Clearance: 45.1 mL/min (by C-G formula based on SCr of 0.57 mg/dL).  Liver Function Tests: Recent Labs  Lab 09/18/20 0945  AST QUANTITY NOT SUFFICIENT, UNABLE TO  PERFORM TEST  ALT 6  ALKPHOS 172*  BILITOT 0.1*  PROT 7.2  ALBUMIN 2.6*     Thyroid Function Tests: Recent Labs    09/18/20 2147  TSH 1.077     Recent Results (from the past 240 hour(s))  Resp Panel by RT-PCR (Flu A&B, Covid) Nasopharyngeal Swab     Status: None   Collection Time: 09/18/20  8:00 AM   Specimen: Nasopharyngeal Swab; Nasopharyngeal(NP) swabs in vial transport medium  Result Value Ref Range Status   SARS Coronavirus 2 by RT PCR NEGATIVE NEGATIVE Final    Comment: (NOTE) SARS-CoV-2 target nucleic acids are NOT DETECTED.  The SARS-CoV-2 RNA is generally detectable in upper respiratory specimens during the acute phase of infection. The lowest concentration of SARS-CoV-2 viral copies this assay can detect is 138 copies/mL. A negative result does not preclude SARS-Cov-2 infection and should not be used as the sole basis for treatment or other patient management decisions. A negative result may occur with  improper specimen collection/handling, submission of specimen other than nasopharyngeal swab, presence of viral mutation(s) within the areas targeted by this assay, and inadequate number of viral copies(<138 copies/mL). A negative result must be combined with clinical observations, patient history, and epidemiological information. The expected result is Negative.  Fact Sheet for Patients:  EntrepreneurPulse.com.au  Fact Sheet for Healthcare Providers:  IncredibleEmployment.be  This test is no t yet approved or cleared by the [place] FDA and  has been authorized for detection and/or diagnosis of SARS-CoV-2 by FDA under an Emergency Use Authorization (EUA). This EUA will remain  in effect (meaning this test can be used) for the duration of the COVID-19 declaration under Section 564(b)(1) of the Act, 21 U.S.C.section 360bbb-3(b)(1), unless the authorization is terminated  or revoked sooner.       Influenza A by PCR  NEGATIVE NEGATIVE Final   Influenza B by PCR NEGATIVE NEGATIVE Final    Comment: (NOTE) The Xpert Xpress SARS-CoV-2/FLU/RSV plus assay is intended as an aid in the diagnosis of influenza from Nasopharyngeal swab specimens and  should not be used as a sole basis for treatment. Nasal washings and aspirates are unacceptable for Xpert Xpress SARS-CoV-2/FLU/RSV testing.  Fact Sheet for Patients: EntrepreneurPulse.com.au  Fact Sheet for Healthcare Providers: IncredibleEmployment.be  This test is not yet approved or cleared by the [place] FDA and has been authorized for detection and/or diagnosis of SARS-CoV-2 by FDA under an Emergency Use Authorization (EUA). This EUA will remain in effect (meaning this test can be used) for the duration of the COVID-19 declaration under Section 564(b)(1) of the Act, 21 U.S.C. section 360bbb-3(b)(1), unless the authorization is terminated or revoked.  Performed at [facility], [place] [address] [place], [place] [address]   Blood culture (routine x 2)     Status: None (Preliminary result)   Collection Time: 09/18/20  9:22 AM   Specimen: BLOOD  Result Value Ref Range Status   Specimen Description BLOOD BLOOD RIGHT FOREARM  Final   Special Requests   Final    BOTTLES DRAWN AEROBIC AND ANAEROBIC Blood Culture results may not be optimal due to an inadequate volume of blood received in culture bottles   Culture   Final    NO GROWTH 3 DAYS Performed at [facility], [place] [address], [place], [place] [address]    Report Status PENDING  Incomplete  Blood culture (routine x 2)     Status: None (Preliminary result)   Collection Time: 09/18/20  9:27 AM   Specimen: BLOOD  Result Value Ref Range Status   Specimen Description BLOOD RIGHT ANTECUBITAL  Final   Special Requests   Final    BOTTLES DRAWN AEROBIC AND ANAEROBIC Blood Culture results may not be optimal due to an inadequate volume of blood received in  culture bottles   Culture   Final    NO GROWTH 3 DAYS Performed at [facility], [place] [address], [place], [place] [address]    Report Status PENDING  Incomplete  Culture, body fluid w Gram Stain-bottle     Status: None (Preliminary result)   Collection Time: 09/19/20  4:46 PM   Specimen: Fluid  Result Value Ref Range Status   Specimen Description FLUID PLEURAL RIGHT  Final   Special Requests BOTTLES DRAWN AEROBIC AND ANAEROBIC  Final   Culture   Final    NO GROWTH 2 DAYS Performed at [facility], [place] [address] [place], [place] [address]    Report Status PENDING  Incomplete  Gram stain     Status: None   Collection Time: 09/19/20  4:46 PM   Specimen: Fluid  Result Value Ref Range Status   Specimen Description FLUID PLEURAL RIGHT  Final   Special Requests NONE  Final   Gram Stain   Final    RARE WBC PRESENT, PREDOMINANTLY PMN NO ORGANISMS SEEN Performed at [facility], [place] [address] [place], [place] [address]    Report Status 09/20/2020 FINAL  Final  MRSA PCR Screening     Status: None   Collection Time: 09/20/20  4:30 AM   Specimen: Nasal Mucosa; Nasopharyngeal  Result Value Ref Range Status   MRSA by PCR NEGATIVE NEGATIVE Final    Comment:        The GeneXpert MRSA Assay (FDA approved for NASAL specimens only), is one component of a comprehensive MRSA colonization surveillance program. It is not intended to diagnose MRSA infection nor to guide or monitor treatment for MRSA infections. Performed at [facility], [place]  [address] [place], [place] [address]       Radiology Studies: DG Chest Port 1 View  Result Date: 09/21/2020 CLINICAL DATA:  Chest tube in place.  Pleural effusion. EXAM: PORTABLE CHEST 1 VIEW COMPARISON:  September 20, 2020 chest radiograph; chest CT September 18, 2020 FINDINGS: Chest tube position unchanged on the right. No evident pneumothorax. Loculated pleural effusion on the right again noted with much smaller free-flowing  component on the right. Left lung clear. Heart size and pulmonary vascularity normal. No adenopathy no bone lesions. IMPRESSION: Stable chest tube position on the right without evident pneumothorax. Area of loculated pleural effusion on the right persists, similar to 1 day prior with smaller free-flowing component on the right. No new opacity evident. Left lung clear. Stable cardiac silhouette. Electronically Signed   By: Lowella Grip III M.D.   On: 09/21/2020 08:52   DG CHEST PORT 1 VIEW  Result Date: 09/20/2020 CLINICAL DATA:  Right-sided chest tube. EXAM: PORTABLE CHEST 1 VIEW COMPARISON:  09/19/2020.  CT 09/18/2020. FINDINGS: Right chest tube noted over the right lower chest. Prominent rounded density, most likely loculated pleural fluid again noted over the right chest base. Tiny amount of free pleural air may be present over the right lung base at the site of chest tube insertion. No prominent pneumothorax noted. Follow-up chest x-ray suggested for continued evaluation. Calcified nodule right upper lobe most likely granuloma. Heart size stable. No acute bony abnormality. IMPRESSION: Right chest tube noted over the right lower chest. Prominent rounded density, most likely pleural fluid again noted over the right chest base. No significant interim change in appearance. Tiny amount of free pleural air may be present over the right lung base at the site of chest tube insertion. No prominent pneumothorax noted. Follow-up chest x-ray suggested for continued evaluation. These results will be called to the ordering clinician or representative by the Radiologist Assistant, and communication documented in the PACS or [employer]. Electronically Signed   By: Marcello Moores  Register   On: 09/20/2020 12:06   DG Swallowing Func-Speech Pathology  Result Date: 09/19/2020 Objective Swallowing Evaluation: Type of Study: MBS-Modified Barium Swallow Study  Patient Details Name: Jasdeep Dejarnett MRN: [id number] Date of  Birth: August 23, 1940 Today's Date: 09/19/2020 Time: SLP Start Time (ACUTE ONLY): 1306 -SLP Stop Time (ACUTE ONLY): 1327 SLP Time Calculation (min) (ACUTE ONLY): 21 min Past Medical History: Past Medical History: Diagnosis Date . Anxiety  . Arthritis  . Cancer [facility])   breast CA 2002 . CHF (congestive heart failure) ([place])  . COPD (chronic obstructive pulmonary disease) ([place])  . Dementia ([place])  . Hypercholesterolemia  . Hyperthyroidism  . Osteopenia  Past Surgical History: Past Surgical History: Procedure Laterality Date . ABDOMINAL HYSTERECTOMY   . APPENDECTOMY   . BACK SURGERY   . BREAST SURGERY   . MASTECTOMY W/ SENTINEL NODE BIOPSY Left 02/26/2016  Procedure: LEFT MASTECTOMY WITH SENTINEL LYMPH NODE BIOPSY;  Surgeon: Coralie Keens, MD;  Location: [facility];  Service: General;  Laterality: Left; HPI: Pt is a 80 yo female presenting with SOB, admitted with sepsis secondary to R empyema with respiratory failure. CXR concerning for R-sided PNA with moderate pleural effusion. Family reported coughing while eating and talking. PMH includes: dementia, COPD, systolic CHF, hypothyroidism, breast ca s/p L mastectomy, recent fall  Subjective: alert, pleasant, needs repetition Assessment / Plan / Recommendation CHL IP CLINICAL IMPRESSIONS 09/19/2020 Clinical Impression Pt presents with a pharyngeal more than oral dysphagia with decreased airway protection across all liquids. She has reduced  anterior hyoid movement, base of tongue retraction, and epiglottic inversion that results in airway invasion during the swallow, but also leaves increasing amounts of pharyngeal residue as boluses become thicker. Thin liquids are aspirated silently, and cannot be cleared with a cued cough. A chin tuck does NOT prevent aspiration. With nectar thick liquids she penetrates above the vocal folds under certain conditions, such as when she takes straw sips, large volumes via cup, or if her valleculae is full of nectar-thick residue that spills over the  epiglottis as she is working on more prolonged mastication of solids. This penetration clears more readily with a cued cough or throat clear, and the remaining cup sips are consumed without entering the airway. Honey thick liquids are aspirated, perhaps in part due to the posterior head tilt that she uses to access them from the cup. Recommend starting wtih Dys 2 (finely chopped) foods and nectar thick liquids by cup. Would encourage small, single sips and intermittent throat clears, although anticipate that pt will need assistance for reinforcement.  SLP Visit Diagnosis Dysphagia, oropharyngeal phase (R13.12) Attention and concentration deficit following -- Frontal lobe and executive function deficit following -- Impact on safety and function Moderate aspiration risk   CHL IP TREATMENT RECOMMENDATION 09/19/2020 Treatment Recommendations Therapy as outlined in treatment plan below   Prognosis 09/19/2020 Prognosis for Safe Diet Advancement Fair Barriers to Reach Goals Cognitive deficits Barriers/Prognosis Comment -- CHL IP DIET RECOMMENDATION 09/19/2020 SLP Diet Recommendations Dysphagia 2 (Fine chop) solids;Nectar thick liquid Liquid Administration via Cup;No straw Medication Administration Crushed with puree Compensations Minimize environmental distractions;Slow rate;Small sips/bites;Clear throat intermittently Postural Changes Remain semi-upright after after feeds/meals (Comment);Seated upright at 90 degrees   CHL IP OTHER RECOMMENDATIONS 09/19/2020 Recommended Consults -- Oral Care Recommendations Oral care BID Other Recommendations Order thickener from pharmacy;Prohibited food (jello, ice cream, thin soups);Remove water pitcher   CHL IP FOLLOW UP RECOMMENDATIONS 09/19/2020 Follow up Recommendations Skilled Nursing facility   [facility] IP FREQUENCY AND DURATION 09/19/2020 Speech Therapy Frequency (ACUTE ONLY) min 2x/week Treatment Duration 2 weeks      CHL IP ORAL PHASE 09/19/2020 Oral Phase Impaired Oral - Pudding Teaspoon  -- Oral - Pudding Cup -- Oral - Honey Teaspoon -- Oral - Honey Cup Lingual/palatal residue Oral - Nectar Teaspoon -- Oral - Nectar Cup WFL Oral - Nectar Straw WFL Oral - Thin Teaspoon -- Oral - Thin Cup WFL Oral - Thin Straw WFL Oral - Puree WFL Oral - Mech Soft Impaired mastication;Lingual/palatal residue Oral - Regular -- Oral - Multi-Consistency -- Oral - Pill -- Oral Phase - Comment --  CHL IP PHARYNGEAL PHASE 09/19/2020 Pharyngeal Phase -- Pharyngeal- Pudding Teaspoon -- Pharyngeal -- Pharyngeal- Pudding Cup -- Pharyngeal -- Pharyngeal- Honey Teaspoon -- Pharyngeal -- Pharyngeal- Honey Cup Reduced epiglottic inversion;Reduced anterior laryngeal mobility;Reduced tongue base retraction;Pharyngeal residue - valleculae;Penetration/Aspiration during swallow;Lateral channel residue Pharyngeal Material enters airway, passes BELOW cords then ejected out Pharyngeal- Nectar Teaspoon -- Pharyngeal -- Pharyngeal- Nectar Cup Reduced epiglottic inversion;Reduced anterior laryngeal mobility;Reduced tongue base retraction;Pharyngeal residue - valleculae;Penetration/Apiration after swallow;Penetration/Aspiration during swallow Pharyngeal Material enters airway, remains ABOVE vocal cords and not ejected out Pharyngeal- Nectar Straw Reduced epiglottic inversion;Reduced anterior laryngeal mobility;Reduced tongue base retraction;Pharyngeal residue - valleculae;Lateral channel residue;Penetration/Aspiration during swallow Pharyngeal Material enters airway, remains ABOVE vocal cords and not ejected out Pharyngeal- Thin Teaspoon -- Pharyngeal -- Pharyngeal- Thin Cup Reduced epiglottic inversion;Reduced anterior laryngeal mobility;Reduced tongue base retraction;Pharyngeal residue - valleculae;Penetration/Aspiration during swallow Pharyngeal Material enters airway, passes BELOW cords without attempt by  patient to eject out (silent aspiration) Pharyngeal- Thin Straw Reduced epiglottic inversion;Reduced anterior laryngeal mobility;Reduced  tongue base retraction;Pharyngeal residue - valleculae;Compensatory strategies attempted (with notebox);Penetration/Aspiration during swallow Pharyngeal Material enters airway, passes BELOW cords without attempt by patient to eject out (silent aspiration) Pharyngeal- Puree Reduced epiglottic inversion;Reduced anterior laryngeal mobility;Reduced tongue base retraction;Pharyngeal residue - valleculae Pharyngeal -- Pharyngeal- Mechanical Soft Reduced epiglottic inversion;Reduced anterior laryngeal mobility;Reduced tongue base retraction;Pharyngeal residue - valleculae Pharyngeal -- Pharyngeal- Regular -- Pharyngeal -- Pharyngeal- Multi-consistency -- Pharyngeal -- Pharyngeal- Pill -- Pharyngeal -- Pharyngeal Comment --  CHL IP CERVICAL ESOPHAGEAL PHASE 09/19/2020 Cervical Esophageal Phase Impaired Pudding Teaspoon -- Pudding Cup -- Honey Teaspoon -- Honey Cup Reduced cricopharyngeal relaxation Nectar Teaspoon -- Nectar Cup Reduced cricopharyngeal relaxation Nectar Straw Reduced cricopharyngeal relaxation Thin Teaspoon -- Thin Cup Reduced cricopharyngeal relaxation Thin Straw Reduced cricopharyngeal relaxation Puree Reduced cricopharyngeal relaxation Mechanical Soft Reduced cricopharyngeal relaxation Regular -- Multi-consistency -- Pill -- Cervical Esophageal Comment -- Osie Bond., M.A. CCC-SLP Acute Rehabilitation Services Pager [phone] Office [phone] 09/19/2020, 3:03 PM              IR PERC PLEURAL DRAIN W/[facility] CATH W/IMG GUIDE  Result Date: 09/20/2020 INDICATION: Loculated right pleural effusion EXAM: ULTRASOUND AND FLUOROSCOPIC 10 FRENCH RIGHT CHEST TUBE INSERTION MEDICATIONS: The patient is currently admitted to the hospital and receiving intravenous antibiotics. The antibiotics were administered within an appropriate time frame prior to the initiation of the procedure. ANESTHESIA/SEDATION: Fentanyl 25 mcg IV; Versed 5 0 mg IV Moderate Sedation Time:  NONE. The patient was continuously monitored  during the procedure by the interventional radiology nurse under my direct supervision. COMPLICATIONS: None immediate. PROCEDURE: Informed written consent was obtained from the PATIENT'S SON after a thorough discussion of the procedural risks, benefits and alternatives. All questions were addressed. Maximal Sterile Barrier Technique was utilized including caps, mask, sterile gowns, sterile gloves, sterile drape, hand hygiene and skin antiseptic. A timeout was performed prior to the initiation of the procedure. Previous imaging reviewed. Patient positioned right anterior oblique. Complex septated loculated right effusion was localized in the mid axillary line through a lower intercostal space. Under sterile conditions and local anesthesia, an 18 gauge 10 cm access needle was advanced through a lower intercostal space under direct ultrasound into the effusion. Needle position confirmed with ultrasound. Images obtained for documentation. Guidewire inserted followed by tract dilatation to insert a 10 [place] drain. Drain catheter position confirmed with ultrasound and fluoroscopy. Images obtained for documentation. Serosanguineous yellow pleural fluid aspirated. Sample sent for laboratory analysis and culture. Catheter secured with a Prolene suture and connected to external pleura vac. Sterile dressing applied. No immediate complication. Patient tolerated the procedure well. IMPRESSION: Successful ultrasound and fluoroscopic 10 French right chest tube insertion. Electronically Signed   By: Jerilynn Mages.  Shick M.D.   On: 09/20/2020 08:27       LOS: 3 days   [facility] Pager on [email or web address]  09/21/2020, 10:30 AM

## 2020-09-21 NOTE — Progress Notes (Signed)
Referring Physician(s): Dr. Bonnielee Haff  Supervising Physician: Arne Cleveland  Patient Status:  [facility] - In-pt  Chief Complaint: Loculated pleural effusion  Brief History: [name] is a 80 y.o. female with medical issues including COPD, systolic CHF [id number], hypothyroidism, and breast cancer s/p left mastectomy.  She presented to the ED from her facility c/o shortness of breath.  CT done 4/26 showed a large loculated right pleural effusion.  CT surgery was consulted and feel the pt is high risk for surgery and recommended IR perform image guided pigtail chest tube placement.  She had the procedure 09/19/20 by Dr. Annamaria Boots.  Since placement there has been 570 mL recorded output, but there appears to be more than that in the pleurevac. The fluid is serosanguinous in appearance.  Pulmonary has been consulted today for management and recommendations.  Subjective:  [name] c/o severe right chest pain she feels is due to the catheter.  Pulmonary has instilled Dornase and tPA.  Allergies: Patient has no known allergies.  Medications: Prior to Admission medications   Medication Sig Start Date End Date Taking? Authorizing Provider  acetaminophen (TYLENOL) 650 MG CR tablet Take 650 mg by mouth in the morning and at bedtime.   Yes [provider]  anastrozole (ARIMIDEX) 1 MG tablet Take 1 tablet (1 mg total) by mouth daily. 09/11/16  Yes Nicholas Lose, MD  atorvastatin (LIPITOR) 40 MG tablet Take 40 mg by mouth at bedtime.   Yes [provider]  budesonide-formoterol (SYMBICORT) 80-4.5 MCG/ACT inhaler Inhale 2 puffs into the lungs 2 (two) times daily.   Yes [provider]  Calcium Citrate-Vitamin D (CALCIUM CITRATE PETITE/VIT D PO) Take 1 tablet by mouth in the morning.   Yes [provider]  carvedilol (COREG) 3.125 MG tablet Take 3.125 mg by mouth 2 (two) times daily with a meal.   Yes [provider]  Cholecalciferol (VITAMIN  D3) 50 MCG (2000 UT) TABS Take 2,000 Units by mouth in the morning.   Yes [provider]  famotidine (PEPCID) 40 MG tablet Take 40 mg by mouth at bedtime.   Yes [provider]  furosemide (LASIX) 20 MG tablet Take 20 mg by mouth in the morning. 11/14/15  Yes [provider]  gabapentin (NEURONTIN) 100 MG capsule Take 200 mg by mouth 2 (two) times daily.    Yes [provider]  levothyroxine (SYNTHROID, LEVOTHROID) 25 MCG tablet Take 25 mcg by mouth daily before breakfast.  01/23/16  Yes [provider]  memantine (NAMENDA) 10 MG tablet Take 1 tablet (10 mg total) by mouth 2 (two) times daily. 03/17/16  Yes Penumalli, Earlean Polka, MD  mirabegron ER (MYRBETRIQ) 25 MG TB24 tablet Take 25 mg by mouth daily.   Yes [provider]  Multiple Vitamin (MULTIVITAMIN WITH MINERALS) TABS tablet Take 1 tablet by mouth daily.   Yes [provider]  OXYGEN Inhale 2 L/min into the lungs See admin instructions. 2 L/min at bedtime and an additional 2 L/min as needed for shortness of breath   Yes [provider]  potassium chloride SA (K-DUR,KLOR-CON) 20 MEQ tablet Take 20 mEq by mouth daily.  08/31/15  Yes [provider]  venlafaxine XR (EFFEXOR-XR) 150 MG 24 hr capsule Take 150 mg by mouth daily with breakfast.   Yes [provider]     Vital Signs: BP 126/73 (BP Location: Left Arm)   Pulse 100   Temp 98 F (36.7 C)   Resp 17  Ht 5\' 2"  (1.575 m)   Wt 50.9 kg   SpO2 93%   BMI 20.52 kg/m   Physical Exam Vitals reviewed.  Constitutional:      Appearance: Normal appearance.  Cardiovascular:     Rate and Rhythm: Normal rate.  Pulmonary:     Effort: Pulmonary effort is normal. No respiratory distress.     Comments: Chest tube in place on the right. ~150 mL output recorded. Abdominal:     Palpations: Abdomen is soft.  Neurological:     General: No focal deficit present.     Mental Status: She is alert and oriented  to person, place, and time.  Psychiatric:        Mood and Affect: Mood normal.        Behavior: Behavior normal.        Thought Content: Thought content normal.        Judgment: Judgment normal.     Imaging: CT CHEST WO CONTRAST  Result Date: 09/18/2020 CLINICAL DATA:  Abnormal chest radiograph, pleural effusion EXAM: CT CHEST WITHOUT CONTRAST TECHNIQUE: Multidetector CT imaging of the chest was performed following the standard protocol without IV contrast. COMPARISON:  Chest radiograph dated 09/18/2020. CTA chest dated 09/27/2015. FINDINGS: Cardiovascular: The heart is top-normal in size. No pericardial effusion. No evidence of thoracic aortic aneurysm. Atherosclerotic calcifications of the aortic arch. Three vessel coronary atherosclerosis. Mediastinum/Nodes: Small mediastinal lymph nodes, likely reactive. Visualized thyroid is grossly unremarkable. Lungs/Pleura: Biapical pleural-parenchymal scarring. Moderate centrilobular emphysematous changes, upper lung predominant. Moderate right pleural effusion, loculated. Associated right lower lobe compressive atelectasis. No focal consolidation. No suspicious pulmonary nodules. No pneumothorax. Upper Abdomen: Visualized upper abdomen is notable for motion degradation, calcified splenic granulomata, and vascular calcifications. Musculoskeletal: Mild degenerative changes of the visualized thoracolumbar spine. IMPRESSION: Moderate right pleural effusion, loculated. Associated right lower lobe compressive atelectasis. Aortic Atherosclerosis (ICD10-I70.0) and Emphysema (ICD10-J43.9). Electronically Signed   By: Julian Hy M.D.   On: 09/18/2020 15:56   DG Chest Port 1 View  Result Date: 09/21/2020 CLINICAL DATA:  Right-sided chest tube.  Worsening chest pain. EXAM: PORTABLE CHEST 1 VIEW COMPARISON:  Earlier today at 0800 hours. FINDINGS: Right-sided small bore chest tube is unchanged in position. Midline trachea. Normal heart size. Atherosclerosis in the  transverse aorta. A moderate amount of loculated pleural fluid may be slightly increased. No pneumothorax. No left-sided pleural fluid. Worsening right base aeration. Clear left lung. IMPRESSION: Right-sided chest tube remains in place with minimal increase in right-sided loculated pleural fluid and adjacent basilar airspace disease. Aortic Atherosclerosis (ICD10-I70.0). Electronically Signed   By: Abigail Miyamoto M.D.   On: 09/21/2020 14:01   DG Chest Port 1 View  Result Date: 09/21/2020 CLINICAL DATA:  Chest tube in place.  Pleural effusion. EXAM: PORTABLE CHEST 1 VIEW COMPARISON:  September 20, 2020 chest radiograph; chest CT September 18, 2020 FINDINGS: Chest tube position unchanged on the right. No evident pneumothorax. Loculated pleural effusion on the right again noted with much smaller free-flowing component on the right. Left lung clear. Heart size and pulmonary vascularity normal. No adenopathy no bone lesions. IMPRESSION: Stable chest tube position on the right without evident pneumothorax. Area of loculated pleural effusion on the right persists, similar to 1 day prior with smaller free-flowing component on the right. No new opacity evident. Left lung clear. Stable cardiac silhouette. Electronically Signed   By: Lowella Grip III M.D.   On: 09/21/2020 08:52   DG CHEST PORT 1 VIEW  Result Date: 09/20/2020  CLINICAL DATA:  Right-sided chest tube. EXAM: PORTABLE CHEST 1 VIEW COMPARISON:  09/19/2020.  CT 09/18/2020. FINDINGS: Right chest tube noted over the right lower chest. Prominent rounded density, most likely loculated pleural fluid again noted over the right chest base. Tiny amount of free pleural air may be present over the right lung base at the site of chest tube insertion. No prominent pneumothorax noted. Follow-up chest x-ray suggested for continued evaluation. Calcified nodule right upper lobe most likely granuloma. Heart size stable. No acute bony abnormality. IMPRESSION: Right chest tube noted  over the right lower chest. Prominent rounded density, most likely pleural fluid again noted over the right chest base. No significant interim change in appearance. Tiny amount of free pleural air may be present over the right lung base at the site of chest tube insertion. No prominent pneumothorax noted. Follow-up chest x-ray suggested for continued evaluation. These results will be called to the ordering clinician or representative by the Radiologist Assistant, and communication documented in the PACS or [employer]. Electronically Signed   By: Marcello Moores  Register   On: 09/20/2020 12:06   DG CHEST PORT 1 VIEW  Result Date: 09/19/2020 CLINICAL DATA:  Shortness of breath, dyspnea. EXAM: PORTABLE CHEST 1 VIEW COMPARISON:  September 18, 2020. FINDINGS: The heart size and mediastinal contours are within normal limits. No pneumothorax is noted. Left lung is clear. Continued presence of loculated right pleural effusion is noted with associated right basilar atelectasis or infiltrate. The visualized skeletal structures are unremarkable. IMPRESSION: Continued presence of loculated right pleural effusion with associated right basilar atelectasis or infiltrate. Electronically Signed   By: Marijo Conception M.D.   On: 09/19/2020 08:11   DG Chest Port 1 View  Result Date: 09/18/2020 CLINICAL DATA:  Shortness of breath. EXAM: PORTABLE CHEST 1 VIEW COMPARISON:  09/16/2020. FINDINGS: Heart size normal. Right base infiltrate with moderate right pleural effusion. Calcified nodule right upper lung again noted consistent calcified granuloma. No pneumothorax. No acute bony abnormality. Splenic calcifications consistent granulomas again noted. IMPRESSION: Right base infiltrate with moderate right pleural effusion. Electronically Signed   By: Marcello Moores  Register   On: 09/18/2020 08:21   DG Swallowing Func-Speech Pathology  Result Date: 09/19/2020 Objective Swallowing Evaluation: Type of Study: MBS-Modified Barium Swallow Study   Patient Details Name: Charli Caraker MRN: [id number] Date of Birth: 1941-05-15 Today's Date: 09/19/2020 Time: SLP Start Time (ACUTE ONLY): [id number] -SLP Stop Time (ACUTE ONLY): 1327 SLP Time Calculation (min) (ACUTE ONLY): 21 min Past Medical History: Past Medical History: Diagnosis Date . Anxiety  . Arthritis  . Cancer [facility])   breast CA 2002 . CHF (congestive heart failure) ([place])  . COPD (chronic obstructive pulmonary disease) ([place])  . Dementia ([place])  . Hypercholesterolemia  . Hyperthyroidism  . Osteopenia  Past Surgical History: Past Surgical History: Procedure Laterality Date . ABDOMINAL HYSTERECTOMY   . APPENDECTOMY   . BACK SURGERY   . BREAST SURGERY   . MASTECTOMY W/ SENTINEL NODE BIOPSY Left 02/26/2016  Procedure: LEFT MASTECTOMY WITH SENTINEL LYMPH NODE BIOPSY;  Surgeon: Coralie Keens, MD;  Location: [facility];  Service: General;  Laterality: Left; HPI: Pt is a 80 yo female presenting with SOB, admitted with sepsis secondary to R empyema with respiratory failure. CXR concerning for R-sided PNA with moderate pleural effusion. Family reported coughing while eating and talking. PMH includes: dementia, COPD, systolic CHF, hypothyroidism, breast ca s/p L mastectomy, recent fall  Subjective: alert, pleasant, needs repetition Assessment / Plan / Recommendation CHL IP CLINICAL  IMPRESSIONS 09/19/2020 Clinical Impression Pt presents with a pharyngeal more than oral dysphagia with decreased airway protection across all liquids. She has reduced anterior hyoid movement, base of tongue retraction, and epiglottic inversion that results in airway invasion during the swallow, but also leaves increasing amounts of pharyngeal residue as boluses become thicker. Thin liquids are aspirated silently, and cannot be cleared with a cued cough. A chin tuck does NOT prevent aspiration. With nectar thick liquids she penetrates above the vocal folds under certain conditions, such as when she takes straw sips, large volumes via cup, or if her  valleculae is full of nectar-thick residue that spills over the epiglottis as she is working on more prolonged mastication of solids. This penetration clears more readily with a cued cough or throat clear, and the remaining cup sips are consumed without entering the airway. Honey thick liquids are aspirated, perhaps in part due to the posterior head tilt that she uses to access them from the cup. Recommend starting wtih Dys 2 (finely chopped) foods and nectar thick liquids by cup. Would encourage small, single sips and intermittent throat clears, although anticipate that pt will need assistance for reinforcement.  SLP Visit Diagnosis Dysphagia, oropharyngeal phase (R13.12) Attention and concentration deficit following -- Frontal lobe and executive function deficit following -- Impact on safety and function Moderate aspiration risk   CHL IP TREATMENT RECOMMENDATION 09/19/2020 Treatment Recommendations Therapy as outlined in treatment plan below   Prognosis 09/19/2020 Prognosis for Safe Diet Advancement Fair Barriers to Reach Goals Cognitive deficits Barriers/Prognosis Comment -- CHL IP DIET RECOMMENDATION 09/19/2020 SLP Diet Recommendations Dysphagia 2 (Fine chop) solids;Nectar thick liquid Liquid Administration via Cup;No straw Medication Administration Crushed with puree Compensations Minimize environmental distractions;Slow rate;Small sips/bites;Clear throat intermittently Postural Changes Remain semi-upright after after feeds/meals (Comment);Seated upright at 90 degrees   CHL IP OTHER RECOMMENDATIONS 09/19/2020 Recommended Consults -- Oral Care Recommendations Oral care BID Other Recommendations Order thickener from pharmacy;Prohibited food (jello, ice cream, thin soups);Remove water pitcher   CHL IP FOLLOW UP RECOMMENDATIONS 09/19/2020 Follow up Recommendations Skilled Nursing facility   [facility] IP FREQUENCY AND DURATION 09/19/2020 Speech Therapy Frequency (ACUTE ONLY) min 2x/week Treatment Duration 2 weeks      CHL IP  ORAL PHASE 09/19/2020 Oral Phase Impaired Oral - Pudding Teaspoon -- Oral - Pudding Cup -- Oral - Honey Teaspoon -- Oral - Honey Cup Lingual/palatal residue Oral - Nectar Teaspoon -- Oral - Nectar Cup WFL Oral - Nectar Straw WFL Oral - Thin Teaspoon -- Oral - Thin Cup WFL Oral - Thin Straw WFL Oral - Puree WFL Oral - Mech Soft Impaired mastication;Lingual/palatal residue Oral - Regular -- Oral - Multi-Consistency -- Oral - Pill -- Oral Phase - Comment --  CHL IP PHARYNGEAL PHASE 09/19/2020 Pharyngeal Phase -- Pharyngeal- Pudding Teaspoon -- Pharyngeal -- Pharyngeal- Pudding Cup -- Pharyngeal -- Pharyngeal- Honey Teaspoon -- Pharyngeal -- Pharyngeal- Honey Cup Reduced epiglottic inversion;Reduced anterior laryngeal mobility;Reduced tongue base retraction;Pharyngeal residue - valleculae;Penetration/Aspiration during swallow;Lateral channel residue Pharyngeal Material enters airway, passes BELOW cords then ejected out Pharyngeal- Nectar Teaspoon -- Pharyngeal -- Pharyngeal- Nectar Cup Reduced epiglottic inversion;Reduced anterior laryngeal mobility;Reduced tongue base retraction;Pharyngeal residue - valleculae;Penetration/Apiration after swallow;Penetration/Aspiration during swallow Pharyngeal Material enters airway, remains ABOVE vocal cords and not ejected out Pharyngeal- Nectar Straw Reduced epiglottic inversion;Reduced anterior laryngeal mobility;Reduced tongue base retraction;Pharyngeal residue - valleculae;Lateral channel residue;Penetration/Aspiration during swallow Pharyngeal Material enters airway, remains ABOVE vocal cords and not ejected out Pharyngeal- Thin Teaspoon -- Pharyngeal -- Pharyngeal- Thin Cup Reduced  epiglottic inversion;Reduced anterior laryngeal mobility;Reduced tongue base retraction;Pharyngeal residue - valleculae;Penetration/Aspiration during swallow Pharyngeal Material enters airway, passes BELOW cords without attempt by patient to eject out (silent aspiration) Pharyngeal- Thin Straw Reduced  epiglottic inversion;Reduced anterior laryngeal mobility;Reduced tongue base retraction;Pharyngeal residue - valleculae;Compensatory strategies attempted (with notebox);Penetration/Aspiration during swallow Pharyngeal Material enters airway, passes BELOW cords without attempt by patient to eject out (silent aspiration) Pharyngeal- Puree Reduced epiglottic inversion;Reduced anterior laryngeal mobility;Reduced tongue base retraction;Pharyngeal residue - valleculae Pharyngeal -- Pharyngeal- Mechanical Soft Reduced epiglottic inversion;Reduced anterior laryngeal mobility;Reduced tongue base retraction;Pharyngeal residue - valleculae Pharyngeal -- Pharyngeal- Regular -- Pharyngeal -- Pharyngeal- Multi-consistency -- Pharyngeal -- Pharyngeal- Pill -- Pharyngeal -- Pharyngeal Comment --  CHL IP CERVICAL ESOPHAGEAL PHASE 09/19/2020 Cervical Esophageal Phase Impaired Pudding Teaspoon -- Pudding Cup -- Honey Teaspoon -- Honey Cup Reduced cricopharyngeal relaxation Nectar Teaspoon -- Nectar Cup Reduced cricopharyngeal relaxation Nectar Straw Reduced cricopharyngeal relaxation Thin Teaspoon -- Thin Cup Reduced cricopharyngeal relaxation Thin Straw Reduced cricopharyngeal relaxation Puree Reduced cricopharyngeal relaxation Mechanical Soft Reduced cricopharyngeal relaxation Regular -- Multi-consistency -- Pill -- Cervical Esophageal Comment -- Osie Bond., M.A. CCC-SLP Acute Rehabilitation Services Pager [phone] Office [phone] 09/19/2020, 3:03 PM              IR PERC PLEURAL DRAIN W/INDWELL CATH W/IMG GUIDE  Result Date: 09/20/2020 INDICATION: Loculated right pleural effusion EXAM: ULTRASOUND AND FLUOROSCOPIC 10 FRENCH RIGHT CHEST TUBE INSERTION MEDICATIONS: The patient is currently admitted to the hospital and receiving intravenous antibiotics. The antibiotics were administered within an appropriate time frame prior to the initiation of the procedure. ANESTHESIA/SEDATION: Fentanyl 25 mcg IV; Versed 5 0 mg IV Moderate  Sedation Time:  NONE. The patient was continuously monitored during the procedure by the interventional radiology nurse under my direct supervision. COMPLICATIONS: None immediate. PROCEDURE: Informed written consent was obtained from the PATIENT'S SON after a thorough discussion of the procedural risks, benefits and alternatives. All questions were addressed. Maximal Sterile Barrier Technique was utilized including caps, mask, sterile gowns, sterile gloves, sterile drape, hand hygiene and skin antiseptic. A timeout was performed prior to the initiation of the procedure. Previous imaging reviewed. Patient positioned right anterior oblique. Complex septated loculated right effusion was localized in the mid axillary line through a lower intercostal space. Under sterile conditions and local anesthesia, an 18 gauge 10 cm access needle was advanced through a lower intercostal space under direct ultrasound into the effusion. Needle position confirmed with ultrasound. Images obtained for documentation. Guidewire inserted followed by tract dilatation to insert a 10 [place] drain. Drain catheter position confirmed with ultrasound and fluoroscopy. Images obtained for documentation. Serosanguineous yellow pleural fluid aspirated. Sample sent for laboratory analysis and culture. Catheter secured with a Prolene suture and connected to external pleura vac. Sterile dressing applied. No immediate complication. Patient tolerated the procedure well. IMPRESSION: Successful ultrasound and fluoroscopic 10 French right chest tube insertion. Electronically Signed   By: Jerilynn Mages.  Shick M.D.   On: 09/20/2020 08:27    Labs:  CBC: Recent Labs    09/18/20 0800 09/19/20 0729 09/20/20 0341 09/21/20 0410  WBC 47.1* 39.8* 28.3* 12.9*  HGB 12.8 11.8* 10.8* 11.3*  HCT 39.5 35.8* 32.4* 35.3*  PLT 410* 400 336 362    COAGS: No results for input(s): INR, APTT in the last 8760 hours.  BMP: Recent Labs    09/18/20 0945 09/19/20 0729  09/20/20 0341 09/21/20 0410  NA 135 139 138 138  K 5.6* 3.8 3.3* 3.5  CL 100 107 101 102  CO2 18* 23 26 26   GLUCOSE 104* 139* 123* 124*  BUN 29* 25* 19 16  CALCIUM 9.2 8.7* 8.6* 8.6*  CREATININE 0.93 0.75 0.68 0.57  GFRNONAA >60 >60 >60 >60    LIVER FUNCTION TESTS: Recent Labs    09/18/20 0945  BILITOT 0.1*  AST QUANTITY NOT SUFFICIENT, UNABLE TO PERFORM TEST  ALT 6  ALKPHOS 172*  PROT 7.2  ALBUMIN 2.6*    Assessment and Plan:  Large loculated right pleural effusion.  Status post image guided pigtail chest tube placement 09/19/20 by Dr. Annamaria Boots.  Pulmonary has been consulted (Dr. Shearon Stalls) and has instilled Dornase/tPA with recommendation for BID dornase/tpa for three days.  Will follow and remove chest tube when OK with pulmonary.  Electronically Signed: Murrell Redden, PA-C 09/21/2020, 2:44 PM    I spent a total of 15 Minutes at the the patient's bedside AND on the patient's hospital floor or unit, greater than 50% of which was counseling/coordinating care for f/u chest.

## 2020-09-21 NOTE — Consult Note (Signed)
NAME:  [name], MRN:  [id number], DOB:  1941-03-19, LOS: 3 ADMISSION DATE:  09/18/2020, CONSULTATION DATE:  09/21/2020 REFERRING MD:  Bonnielee Haff, MD, CHIEF COMPLAINT:  Pleural effusion  History of Present Illness:  The patient is a 80 y.o. woman with history of dementia who present on 4/26 from [place] home by EMS for increased work of breathing and cough.  History is obtained primarily from chart review and attending physician as the patient has dementia and is poor historian.  Family and staff have noted frequent coughing and choking episodes while eating and talking and she has been losing weight unintentionally.  Upon arrival to the ED she was found to be tachycardic, tachypneic, with leukocytosis.  CT chest demonstrated loculated right basilar effusion.  CT surgery was consulted to evaluate this effusion and they recommended IR guided chest tube drainage.  Chest tube was placed by IR on 4/27.  About 600 cc of serous fluid has been drained so far.  Chest x-ray yesterday showed persistent loculation on the right base of the lung.  Pulmonary is being consulted today to manage the pleural effusion and chest tube.  This morning the patient has no complaints she denies any respiratory distress.  She has been evaluated by speech and is on a modified dysphagia 2 diet.  Pertinent  Medical History  Breast cancer on anastrazole,  Dementia Heart failure reduced ejection fraction COPD Chronic respiratory failure on [facility] Significant Hospital Events: Including procedures, antibiotic start and stop dates in addition to other pertinent events   . 4/26 admitted for loculated effusion, CTCS consulted, recommend IR guided chest tube placement . 4/27 chest tube placed by IR . 4/28 patient with persistent loculations . 4/29 pulmonary consulted to manage chest tube and effusion  Interim History / Subjective:   Objective   Blood pressure 140/78, pulse 94, temperature 98.4 F (36.9 C),  temperature source Oral, resp. rate 16, height 5\' 2"  (1.575 m), weight 50.9 kg, SpO2 96 %.    FiO2 (%):  [28 %-36 %] 36 %   Intake/Output Summary (Last 24 hours) at 09/21/2020 1058 Last data filed at 09/21/2020 0906 Gross per 24 hour  Intake 1992.5 ml  Output 350 ml  Net 1642.5 ml   Filed Weights   09/19/20 0046 09/20/20 0232 09/21/20 0502  Weight: 47.5 kg 50.9 kg 50.9 kg    Examination: General: thin, elderly HENT: mmm Lungs: diminished right lung base. Right lung chest tube in place with 600 cc serous drainage Cardiovascular: RRR, no mrg Abdomen: scaphoid, soft Extremities: thin, no edema Neuro: pleasantly confused, moves all 4 extremities, follows commands MSK: no rashes  Labs/imaging that I havepersonally reviewed  (right click and "Reselect all SmartList Selections" daily)  Pleural fluid gram stain shows WBC, no organism Pleural fluid exudative based on Light's criteria, neutrophil predominance  CT Chest shows loculated Right pleural effusion, chest xray this morning showss some improvement of effusion with residual loculation.   Resolved Hospital Problem list     Assessment & Plan:  [name] is a 80 y.o. woman with history of dementia and aspiration who presents with:  Complex Parapneumonic Effusion In the setting of aspiration pneumonia most likely polymicrobial Complex pleural effusion should be managed with pleural drainage and intra-pleural fibrinolytics based on MIST 2 Trial data. Recommend BID dornase/tpa for three days. I have instilled this morning at 11:50, discussed with nursing to release after 2 hours. Encourage OOB to help with instillation of the lytic therapy. Would narrow  abx to unasyn since no positive culture data  Lenice Llamas, MD Pulmonary and Critical Care Medicine [facility]   CBC: Recent Labs  Lab 09/18/20 0800 09/19/20 0729 09/20/20 0341 09/21/20 0410  WBC 47.1* 39.8* 28.3* 12.9*  NEUTROABS  --  36.5*  --   --    HGB 12.8 11.8* 10.8* 11.3*  HCT 39.5 35.8* 32.4* 35.3*  MCV 93.8 89.7 90.3 92.2  PLT 410* 400 336 235    Basic Metabolic Panel: Recent Labs  Lab 09/18/20 0945 09/19/20 0729 09/20/20 0341 09/21/20 0410  NA 135 139 138 138  K 5.6* 3.8 3.3* 3.5  CL 100 107 101 102  CO2 18* 23 26 26   GLUCOSE 104* 139* 123* 124*  BUN 29* 25* 19 16  CREATININE 0.93 0.75 0.68 0.57  CALCIUM 9.2 8.7* 8.6* 8.6*   GFR: Estimated Creatinine Clearance: 45.1 mL/min (by C-G formula based on SCr of 0.57 mg/dL). Recent Labs  Lab 09/18/20 0800 09/18/20 1003 09/19/20 0729 09/20/20 0341 09/21/20 0410  PROCALCITON  --   --   --  0.81 0.49  WBC 47.1*  --  39.8* 28.3* 12.9*  LATICACIDVEN  --  1.7  --   --   --     Liver Function Tests: Recent Labs  Lab 09/18/20 0945  AST QUANTITY NOT SUFFICIENT, UNABLE TO PERFORM TEST  ALT 6  ALKPHOS 172*  BILITOT 0.1*  PROT 7.2  ALBUMIN 2.6*   No results for input(s): LIPASE, AMYLASE in the last 168 hours. No results for input(s): AMMONIA in the last 168 hours.  ABG    Component Value Date/Time   PHART 7.296 (L) 07/08/2013 0214   PCO2ART 38.1 07/08/2013 0214   PO2ART 60.0 (L) 07/08/2013 0214   HCO3 18.7 (L) 07/08/2013 0214   TCO2 20 07/08/2013 0214   ACIDBASEDEF 7.0 (H) 07/08/2013 0214   O2SAT 89.0 07/08/2013 0214     Coagulation Profile: No results for input(s): INR, PROTIME in the last 168 hours.  Cardiac Enzymes: No results for input(s): CKTOTAL, CKMB, CKMBINDEX, TROPONINI in the last 168 hours.  HbA1C: No results found for: HGBA1C  CBG: No results for input(s): GLUCAP in the last 168 hours.  Review of Systems:   Unable to obtain due to mental status  Past Medical History:  She,  has a past medical history of Anxiety, Arthritis, Cancer ([place]), CHF (congestive heart failure) ([place]), COPD (chronic obstructive pulmonary disease) ([place]), Dementia ([place]), Hypercholesterolemia, Hyperthyroidism, and Osteopenia.   Surgical History:   Past Surgical  History:  Procedure Laterality Date  . ABDOMINAL HYSTERECTOMY    . APPENDECTOMY    . BACK SURGERY    . BREAST SURGERY    . IR PERC PLEURAL DRAIN W/INDWELL CATH W/IMG GUIDE  09/19/2020  . MASTECTOMY W/ SENTINEL NODE BIOPSY Left 02/26/2016   Procedure: LEFT MASTECTOMY WITH SENTINEL LYMPH NODE BIOPSY;  Surgeon: Coralie Keens, MD;  Location: [place];  Service: General;  Laterality: Left;     Social History:   reports that she quit smoking about 26 years ago. She has never used smokeless tobacco. She reports current alcohol use. She reports that she does not use drugs.   Family History:  Her family history includes Diabetes Mellitus I in her son; Leukemia in her son; Stroke in her father.   Allergies No Known Allergies   Home Medications  Prior to Admission medications   Medication Sig Start Date End Date Taking? Authorizing Provider  acetaminophen (TYLENOL) 650 MG CR  tablet Take 650 mg by mouth in the morning and at bedtime.   Yes [provider]  anastrozole (ARIMIDEX) 1 MG tablet Take 1 tablet (1 mg total) by mouth daily. 09/11/16  Yes Nicholas Lose, MD  atorvastatin (LIPITOR) 40 MG tablet Take 40 mg by mouth at bedtime.   Yes [provider]  budesonide-formoterol (SYMBICORT) 80-4.5 MCG/ACT inhaler Inhale 2 puffs into the lungs 2 (two) times daily.   Yes [provider]  Calcium Citrate-Vitamin D (CALCIUM CITRATE PETITE/VIT D PO) Take 1 tablet by mouth in the morning.   Yes [provider]  carvedilol (COREG) 3.125 MG tablet Take 3.125 mg by mouth 2 (two) times daily with a meal.   Yes [provider]  Cholecalciferol (VITAMIN D3) 50 MCG (2000 UT) TABS Take 2,000 Units by mouth in the morning.   Yes [provider]  famotidine (PEPCID) 40 MG tablet Take 40 mg by mouth at bedtime.   Yes [provider]  furosemide (LASIX) 20 MG tablet Take 20 mg by mouth in the morning. 11/14/15  Yes [provider]  gabapentin  (NEURONTIN) 100 MG capsule Take 200 mg by mouth 2 (two) times daily.    Yes [provider]  levothyroxine (SYNTHROID, LEVOTHROID) 25 MCG tablet Take 25 mcg by mouth daily before breakfast.  01/23/16  Yes [provider]  memantine (NAMENDA) 10 MG tablet Take 1 tablet (10 mg total) by mouth 2 (two) times daily. 03/17/16  Yes Penumalli, Earlean Polka, MD  mirabegron ER (MYRBETRIQ) 25 MG TB24 tablet Take 25 mg by mouth daily.   Yes [provider]  Multiple Vitamin (MULTIVITAMIN WITH MINERALS) TABS tablet Take 1 tablet by mouth daily.   Yes [provider]  OXYGEN Inhale 2 L/min into the lungs See admin instructions. 2 L/min at bedtime and an additional 2 L/min as needed for shortness of breath   Yes [provider]  potassium chloride SA (K-DUR,KLOR-CON) 20 MEQ tablet Take 20 mEq by mouth daily.  08/31/15  Yes [provider]  venlafaxine XR (EFFEXOR-XR) 150 MG 24 hr capsule Take 150 mg by mouth daily with breakfast.   Yes [provider]

## 2020-09-21 NOTE — Care Management Important Message (Signed)
Important Message  Patient Details  Name: [name] MRN: [id number] Date of Birth: 1940/09/03   Medicare Important Message Given:  Yes     Shelda Altes 09/21/2020, 9:11 AM

## 2020-09-21 NOTE — Progress Notes (Signed)
Pulmonology notified that pt began having mild pain at CT site after being clamped at 11:50am. Pt refused pain medication and reports it not being bad. 1pm pt began to yell out, reports she is dying, she cant take the pain. RR increased, pt splinting her breaths. Pain medication was given, pt no longer refused it.  C-xray order.

## 2020-09-22 DIAGNOSIS — Z9689 Presence of other specified functional implants: Secondary | ICD-10-CM

## 2020-09-22 DIAGNOSIS — J90 Pleural effusion, not elsewhere classified: Secondary | ICD-10-CM | POA: Diagnosis not present

## 2020-09-22 DIAGNOSIS — F0391 Unspecified dementia with behavioral disturbance: Secondary | ICD-10-CM | POA: Diagnosis not present

## 2020-09-22 DIAGNOSIS — Z7189 Other specified counseling: Secondary | ICD-10-CM

## 2020-09-22 DIAGNOSIS — I5032 Chronic diastolic (congestive) heart failure: Secondary | ICD-10-CM | POA: Diagnosis not present

## 2020-09-22 DIAGNOSIS — J441 Chronic obstructive pulmonary disease with (acute) exacerbation: Secondary | ICD-10-CM | POA: Diagnosis not present

## 2020-09-22 DIAGNOSIS — A419 Sepsis, unspecified organism: Secondary | ICD-10-CM | POA: Diagnosis not present

## 2020-09-22 DIAGNOSIS — Z515 Encounter for palliative care: Secondary | ICD-10-CM

## 2020-09-22 DIAGNOSIS — J189 Pneumonia, unspecified organism: Secondary | ICD-10-CM | POA: Diagnosis not present

## 2020-09-22 LAB — CBC
HCT: 40.1 % (ref 36.0–46.0)
Hemoglobin: 12.9 g/dL (ref 12.0–15.0)
MCH: 29.7 pg (ref 26.0–34.0)
MCHC: 32.2 g/dL (ref 30.0–36.0)
MCV: 92.2 fL (ref 80.0–100.0)
Platelets: 375 10*3/uL (ref 150–400)
RBC: 4.35 MIL/uL (ref 3.87–5.11)
RDW: 13.4 % (ref 11.5–15.5)
WBC: 24.6 10*3/uL — ABNORMAL HIGH (ref 4.0–10.5)
nRBC: 0 % (ref 0.0–0.2)

## 2020-09-22 LAB — BASIC METABOLIC PANEL
Anion gap: 13 (ref 5–15)
BUN: 13 mg/dL (ref 8–23)
CO2: 24 mmol/L (ref 22–32)
Calcium: 8.3 mg/dL — ABNORMAL LOW (ref 8.9–10.3)
Chloride: 98 mmol/L (ref 98–111)
Creatinine, Ser: 0.66 mg/dL (ref 0.44–1.00)
GFR, Estimated: >60 mL/min (ref >60)
Glucose, Bld: 121 mg/dL — ABNORMAL HIGH (ref 70–99)
Potassium: 4.4 mmol/L (ref 3.5–5.1)
Sodium: 135 mmol/L (ref 135–145)

## 2020-09-22 MED ORDER — SODIUM CHLORIDE 0.9 % IV SOLN
3.0000 g | Freq: Three times a day (TID) | INTRAVENOUS | Status: DC
  Administered 2020-09-22 – 2020-09-24 (×7): 3 g via INTRAVENOUS
  Filled 2020-09-22 (×2): qty 8
  Filled 2020-09-22: qty 3
  Filled 2020-09-22: qty 8
  Filled 2020-09-22: qty 3
  Filled 2020-09-22 (×3): qty 8

## 2020-09-22 NOTE — Consult Note (Signed)
Consultation Note Date: 09/22/2020   Patient Name: [name]  DOB: 08-16-1940  MRN: [id number]  Age / Sex: 80 y.o., female  PCP: Mary Pepper, MD Referring Physician: Bonnielee Haff, MD  Reason for Consultation: goals of care, symptom management "patient with dementia and aspiration pneumonia"  HPI/Patient Profile: 80 y.o. female  with past medical history of COPD, systolic CHF (EF 60-10%), hypothyroidism, dementia, and breast cancer s/p left mastectomy who presented to the emergency department on 09/18/2020 with shortness of breath. At baseline, she wears oxygen 2L at night. Family reporting that patient was getting choked up and coughing while eating and talking. ED Course: Required NRB mask to maintain O2 saturations. WBC 47.1, tachycradia, and tachypnea. Chest x-ray showed right lung base infiltrate with moderate right-sided pleural effusion.  Admitted to [facility] with sepsis secondary to right empyema and acute hypoxic respiratory failure. Right chest tube placed 4/27.   Clinical Assessment and Goals of Care: I have reviewed medical records including EPIC notes, labs and imaging, and examined the patient. She is alert and currently reporting pain and shortness of breath.   I spoke with son/[name] and daughter-in-law/[name] by phone to discuss diagnosis, prognosis, GOC, EOL wishes, disposition, and options.  I introduced Palliative Medicine as specialized medical care for people living with serious illness. It focuses on providing relief from the symptoms and stress of a serious illness.   We discussed a brief life review of the patient. She is originally from [place]; re-located to [place] to be closer to her sisters who live in [place]. [name] describes his mother as "independent" and a "loner" - he shares that his mother has lived alone for the vast majority of her life. She was briefly married when she was  younger, but it "didn't last long". She had 2 children (1 son and 1 daughter) but the daughter is "not very involved"   [name] was diagnosed with dementia approximately 7 years ago. Ever since this diagnosis, [name] reports that his mother has been very depressed. As far as functional status, there has been a decline over the past 6 months. Prior to admission she was ambulatory with a walker and mostly independent with her ADLs.   We discussed her current illness and what it means in the larger context of her ongoing co-morbidities.  Detailed discussion was had regarding the diagnosis of dementia and its natural trajectory. We reviewed that dementia is a progressive, non-curable disease underlying the patient's current acute medical conditions. We reviewed specific indicators of end stage dementia, including inability to communicate, bed bound/non-ambulatory status, decreased oral intake, and incontinence of bowel/bladder.  Reviewed PMH and current hospital status with patient/family. Discussed that patient has irreversible dysphagia, likely secondary to dementia. Discussed possible recurrent aspiration pneumonia. Explained that it is a terminal condition.   I attempted to elicit values and goals of care important to the patient. [name] emphasizes that his mother's independence is extremely important. She does not not want to be dependent on others for care.  She didn't even want someone  to help her wash her hair once weekly at the assisted living facility. [name] feels that his mother's quality of life has been declining. She avoids having much contact with family and does not have any hobbies. He states the one thing that seems to bring her joy is her dog. Unfortunately, the dog is 76 years old and [name] has had increasing difficulty caring for it.   The difference between aggressive medical intervention and comfort care was considered in light of the patient's goals of care.  I introduced the  concept of a comfort path to [name] and [name], emphasizing that it involves stopping full scope medical interventions with the goal of comfort rather than prolonging life. Introduced hospice philosophy and information on home vs residential hospice services - answered all questions.   Discussed the concept of watchful waiting - continuing all current medical care with the expectation of improvement. Discussed that is patient does not improve or continues to decline, it may be reasonable to consider transition to comfort care. [name] agrees, especially in the setting of her underlying dementia and declining quality of life. He shares that his mother has been telling him for some time that she "doesn't want to live".   Questions and concerns were addressed.  The family was encouraged to call with questions or concerns. I emailed [name] an electronic copy of "hard choices" book.    Primary decision maker: son [name]    SUMMARY OF RECOMMENDATIONS    DNR/DNI as previously documented  Continue current medical care  Watchful waiting for 48-72 hours  If no improvement or with further decline, family is open to further discussion about comfort care  PMT will continue to follow  Code Status/Advance Care Planning:  DNR  Symptom Management:   Agree with oxycodone IR 5 mg every 4 hours PRN moderate pain  Agree with morphine 2 mg IV every 4 hours PRN severe pain  Palliative Prophylaxis:   Frequent Pain Assessment and Turn Reposition  Additional Recommendations (Limitations, Scope, Preferences):  Full Scope Treatment  Psycho-social/Spiritual:   Created space and opportunity for family to express thoughts and feelings regarding patient's current medical situation.   Emotional support provided   Prognosis:   Unable to determine  Discharge Planning: To Be Determined      Primary Diagnoses: Present on Admission: . Empyema lung ([place]) . Sepsis with acute respiratory failure  ([place]) . COPD with acute exacerbation ([place]) . (Resolved) Chronic diastolic CHF (congestive heart failure) ([place]) . Thrombocytosis . Leukocytosis . Elevated troponin . Dementia ([place])   I have reviewed the medical record, interviewed the patient and family, and examined the patient. The following aspects are pertinent.  Past Medical History:  Diagnosis Date  . Anxiety   . Arthritis   . Cancer [facility])    breast CA 2002  . CHF (congestive heart failure) ([place])   . COPD (chronic obstructive pulmonary disease) ([place])   . Dementia ([place])   . Hypercholesterolemia   . Hyperthyroidism   . Osteopenia     Family History  Problem Relation Age of Onset  . Stroke Father   . Diabetes Mellitus I Son   . Leukemia Son    Scheduled Meds: . alteplase (TPA) for intrapleural administration  10 mg Intrapleural BID   And  . pulmozyme (DORNASE) for intrapleural administration  5 mg Intrapleural BID  . anastrozole  1 mg Oral Daily  . arformoterol  15 mcg Nebulization BID  . atorvastatin  40 mg Oral QHS  .  budesonide (PULMICORT) nebulizer solution  0.5 mg Nebulization BID  . carvedilol  3.125 mg Oral BID WC  . enoxaparin (LOVENOX) injection  40 mg Subcutaneous Q24H  . famotidine  40 mg Oral QHS  . gabapentin  200 mg Oral BID  . ipratropium-albuterol  3 mL Nebulization TID  . levothyroxine  25 mcg Oral QAC breakfast  . memantine  10 mg Oral BID  . mirabegron ER  25 mg Oral Daily  . venlafaxine XR  150 mg Oral Q breakfast   Continuous Infusions: . ampicillin-sulbactam (UNASYN) IV 3 g (09/22/20 1250)   PRN Meds:.acetaminophen, albuterol, morphine injection, ondansetron (ZOFRAN) IV, oxyCODONE, Resource ThickenUp Clear Medications Prior to Admission:  Prior to Admission medications   Medication Sig Start Date End Date Taking? Authorizing Provider  acetaminophen (TYLENOL) 650 MG CR tablet Take 650 mg by mouth in the morning and at bedtime.   Yes [provider]  anastrozole (ARIMIDEX) 1 MG  tablet Take 1 tablet (1 mg total) by mouth daily. 09/11/16  Yes Nicholas Lose, MD  atorvastatin (LIPITOR) 40 MG tablet Take 40 mg by mouth at bedtime.   Yes [provider]  budesonide-formoterol (SYMBICORT) 80-4.5 MCG/ACT inhaler Inhale 2 puffs into the lungs 2 (two) times daily.   Yes [provider]  Calcium Citrate-Vitamin D (CALCIUM CITRATE PETITE/VIT D PO) Take 1 tablet by mouth in the morning.   Yes [provider]  carvedilol (COREG) 3.125 MG tablet Take 3.125 mg by mouth 2 (two) times daily with a meal.   Yes [provider]  Cholecalciferol (VITAMIN D3) 50 MCG (2000 UT) TABS Take 2,000 Units by mouth in the morning.   Yes [provider]  famotidine (PEPCID) 40 MG tablet Take 40 mg by mouth at bedtime.   Yes [provider]  furosemide (LASIX) 20 MG tablet Take 20 mg by mouth in the morning. 11/14/15  Yes [provider]  gabapentin (NEURONTIN) 100 MG capsule Take 200 mg by mouth 2 (two) times daily.    Yes [provider]  levothyroxine (SYNTHROID, LEVOTHROID) 25 MCG tablet Take 25 mcg by mouth daily before breakfast.  01/23/16  Yes [provider]  memantine (NAMENDA) 10 MG tablet Take 1 tablet (10 mg total) by mouth 2 (two) times daily. 03/17/16  Yes Penumalli, Earlean Polka, MD  mirabegron ER (MYRBETRIQ) 25 MG TB24 tablet Take 25 mg by mouth daily.   Yes [provider]  Multiple Vitamin (MULTIVITAMIN WITH MINERALS) TABS tablet Take 1 tablet by mouth daily.   Yes [provider]  OXYGEN Inhale 2 L/min into the lungs See admin instructions. 2 L/min at bedtime and an additional 2 L/min as needed for shortness of breath   Yes [provider]  potassium chloride SA (K-DUR,KLOR-CON) 20 MEQ tablet Take 20 mEq by mouth daily.  08/31/15  Yes [provider]  venlafaxine XR (EFFEXOR-XR) 150 MG 24 hr capsule Take 150 mg by mouth daily with breakfast.   Yes [provider]   No  Known Allergies Review of Systems  Respiratory: Positive for shortness of breath.   Cardiovascular: Positive for chest pain.    Physical Exam Constitutional:      General: She is not in acute distress.    Appearance: She is ill-appearing.  Cardiovascular:     Rate and Rhythm: Tachycardia present.  Pulmonary:     Effort: Pulmonary effort is normal.  Neurological:     Mental Status: She is alert.  Psychiatric:  Cognition and Memory: Cognition is impaired.     Vital Signs: BP 117/74   Pulse (!) 106   Temp (!) 97.5 F (36.4 C) (Oral)   Resp 19   Ht 5\' 2"  (1.575 m)   Wt 49.7 kg   SpO2 92%   BMI 20.04 kg/m  Pain Scale: 0-10 POSS *See Group Information*: S-Acceptable,Sleep, easy to arouse Pain Score: 0-No pain   SpO2: SpO2: 92 % O2 Device:SpO2: 92 % O2 Flow Rate: .O2 Flow Rate (L/min): 4 L/min  IO: Intake/output summary:   Intake/Output Summary (Last 24 hours) at 09/22/2020 1929 Last data filed at 09/22/2020 1906 Gross per 24 hour  Intake 280 ml  Output 1250 ml  Net -970 ml    LBM: Last BM Date: 09/18/20 Baseline Weight: Weight: 55 kg Most recent weight: Weight: 49.7 kg      Palliative Assessment/Data: PPS 30%    Time In: 1600 Time Out: 1710 Time Total: 70 minutes Greater than 50%  of this time was spent counseling and coordinating care related to the above assessment and plan.  Signed by: Lavena Bullion, NP   Please contact Palliative Medicine Team phone at [phone] for questions and concerns.  For individual provider: See Shea Evans

## 2020-09-22 NOTE — Significant Event (Signed)
Patient is alert, confused  Forgetful Right lung chest tube patent with PCMD injected fibronolytic into Chest tube pt pleural space suction off with clamped tube. To reopen at 1215 to restore drainage flow to Pleur-evac.

## 2020-09-22 NOTE — Significant Event (Signed)
Pt clamp and suction to chest tube has been resumed with positive output 300cc

## 2020-09-22 NOTE — Procedures (Signed)
Pleural Fibrinolytic Administration Procedure Note  [name]  [id number]  07-Mar-1941  Date:09/22/20  Time:10:28 PM   Provider Performing:Annastacia Duba W [place]   Procedure: Pleural Fibrinolysis Subsequent day [phone])  Indication(s) Fibrinolysis of complicated pleural effusion  Consent Risks of the procedure as well as the alternatives and risks of each were explained to the patient and/or caregiver.  Consent for the procedure was obtained.   Anesthesia None   Time Out Verified patient identification, verified procedure, site/side was marked, verified correct patient position, special equipment/implants available, medications/allergies/relevant history reviewed, required imaging and test results available.   Sterile Technique Hand hygiene, gloves   Procedure Description Existing pleural catheter was cleaned and accessed in sterile manner.  10mg  of tPA in 30cc of saline and 5mg  of dornase in 30cc of sterile water were injected into pleural space using existing pleural catheter.  Catheter will be clamped for 1 hour and then placed back to suction.   Complications/Tolerance None; patient tolerated the procedure well.  EBL None   Specimen(s) None   Georgann Housekeeper, AGACNP-BC [facility]  See Amion for personal pager PCCM on call pager [phone] until 7pm. Please call Elink 7p-7a. [phone]  09/22/2020 10:28 PM

## 2020-09-22 NOTE — Evaluation (Signed)
Occupational Therapy Evaluation Patient Details Name: [name] MRN: [id number] DOB: March 07, 1941 Today's Date: 09/22/2020    History of Present Illness 80 y.o. woman with history of dementia who present on 4/26 from [place] home by EMS for increased work of breathing and cough. 4/27 chest tube placed by IR.   Clinical Impression   Patient admitted for the diagnosis above.  PTA she lives in an ALF with 24 hour support as needed for ADL, meds, and mobility at [facility] level.  Barriers are listed below.  Currently she is needing up to Mod A for basic mobility.  Out of bed deferred as family would like for the patient to have pain meds on board prior.  OT will continue to follow in the acute setting to fully assess out of bed/toileting, and ADL abilities for an eventual return to ALF.  Discharge disposition will depend on continuing assessment and her performance.  Granddaughter notes the patient had a fall 2 weeks prior, unsure of the details.       Follow Up Recommendations  Home health OT;Supervision/Assistance - 24 hour    Equipment Recommendations  None recommended by OT    Recommendations for Other Services       Precautions / Restrictions Precautions Precautions: Fall Precaution Comments: Chest tube Restrictions Weight Bearing Restrictions: No      Mobility Bed Mobility Overal bed mobility: Needs Assistance Bed Mobility: Supine to Sit;Sit to Supine;Rolling Rolling: Min assist   Supine to sit: Mod assist Sit to supine: Mod assist   General bed mobility comments: patient unable to come to a full sit due to R chest wall pain at chest tube site.  Family asking for pain meds prior to mobility and OOB.    Transfers                 General transfer comment: deferred    Balance Overall balance assessment: Needs assistance Sitting-balance support: Feet unsupported;Bilateral upper extremity supported Sitting balance-Leahy Scale: Poor   Postural control:  Posterior lean;Right lateral lean                                 ADL either performed or assessed with clinical judgement   ADL                                         General ADL Comments: ADL and formal OOB will need to be assessed.     Vision Patient Visual Report: No change from baseline       Perception     Praxis      Pertinent Vitals/Pain Faces Pain Scale: Hurts whole lot Pain Location: in her R side when moving Pain Descriptors / Indicators: Grimacing;Guarding Pain Intervention(s): Limited activity within patient's tolerance     Hand Dominance Right   Extremity/Trunk Assessment Upper Extremity Assessment Upper Extremity Assessment: Overall WFL for tasks assessed   Lower Extremity Assessment Lower Extremity Assessment: Defer to PT evaluation   Cervical / Trunk Assessment Cervical / Trunk Assessment: Kyphotic   Communication Communication Communication: No difficulties   Cognition Arousal/Alertness: Awake/alert Behavior During Therapy: WFL for tasks assessed/performed Overall Cognitive Status: History of cognitive impairments - at baseline  General Comments   Desaturated with bed mobility to 87%.  4L               Home Living Family/patient expects to be discharged to:: Assisted living                             Home Equipment: Walker - 4 wheels;Shower seat;Grab bars - toilet;Grab bars - tub/shower;Hand held shower head   Additional Comments: Apartment      Prior Functioning/Environment Level of Independence: Needs assistance  Gait / Transfers Assistance Needed: Patient walks to the common dining room with 4WRW and supervsision from staff.  She is on the same level as the dining room. ADL's / Homemaking Assistance Needed: Patient has assist for showers, dresses herself with staff supervision.  Able to complete grooming with supervision. Communication  / Swallowing Assistance Needed: Currently on Dys 2 diet          OT Problem List: Decreased activity tolerance;Impaired balance (sitting and/or standing);Pain      OT Treatment/Interventions: Self-care/ADL training;DME and/or AE instruction;Therapeutic activities;Balance training    OT Goals(Current goals can be found in the care plan section) Acute Rehab OT Goals Patient Stated Goal: Keep her pain controled and to return to ALF OT Goal Formulation: With patient/family Time For Goal Achievement: 10/06/20 Potential to Achieve Goals: Good ADL Goals Pt Will Perform Grooming: with supervision;standing;sitting Pt Will Perform Upper Body Bathing: with supervision;sitting Pt Will Perform Upper Body Dressing: with supervision;sitting;standing Pt Will Transfer to Toilet: with modified independence;ambulating Pt Will Perform Toileting - Clothing Manipulation and hygiene: with supervision;sit to/from stand  OT Frequency: Min 2X/week   Barriers to D/C:    none noted       Co-evaluation              AM-PAC OT "6 Clicks" Daily Activity     Outcome Measure Help from another person eating meals?: A Little Help from another person taking care of personal grooming?: A Little Help from another person toileting, which includes using toliet, bedpan, or urinal?: A Lot Help from another person bathing (including washing, rinsing, drying)?: A Lot Help from another person to put on and taking off regular upper body clothing?: A Lot Help from another person to put on and taking off regular lower body clothing?: A Lot 6 Click Score: 14   End of Session Equipment Utilized During Treatment: Oxygen Nurse Communication: Patient requests pain meds  Activity Tolerance: Patient limited by pain Patient left: in bed;with call bell/phone within reach;with family/visitor present  OT Visit Diagnosis: Unsteadiness on feet (R26.81);Pain                Time: 1610-9604 OT Time Calculation (min): 21  min Charges:  OT General Charges $OT Visit: 1 Visit OT Evaluation $OT Eval Moderate Complexity: 1 Mod  09/22/2020  Rich, OTR/L  Acute Rehabilitation Services  Office:  [place] 09/22/2020, 5:03 PM

## 2020-09-22 NOTE — Progress Notes (Signed)
Patient chest tube sahara full with 400 cc out within 1 hour paged  PCMD to make aware.

## 2020-09-22 NOTE — Progress Notes (Signed)
Pharmacy Antibiotic Note  [name] is a 80 y.o. female admitted on 09/18/2020 with pneumonia. Has been receiving IV vancomycin from 4/26-4/28, ceftriaxone from 4/26-4/29, and IV azithromycin from 4/26-4/29. Pharmacy has been consulted to narrow to Unasyn.  Plan: Unasyn 3g IV q8h Monitor cultures, renal function, and length of therapy.  Height: 5\' 2"  (157.5 cm) Weight: 49.7 kg (109 lb 9.1 oz) IBW/kg (Calculated) : 50.1  Temp (24hrs), Avg:97.7 F (36.5 C), Min:97.5 F (36.4 C), Max:98 F (36.7 C)  Recent Labs  Lab 09/18/20 0800 09/18/20 0945 09/18/20 1003 09/19/20 0729 09/20/20 0341 09/21/20 0410 09/22/20 0202  WBC 47.1*  --   --  39.8* 28.3* 12.9* 24.6*  CREATININE  --  0.93  --  0.75 0.68 0.57 0.66  LATICACIDVEN  --   --  1.7  --   --   --   --     Estimated Creatinine Clearance: 44.7 mL/min (by C-G formula based on SCr of 0.66 mg/dL).    No Known Allergies   Antimicrobials this admission: Vancomycin 4/26 >> 4/28 Cefepime 4/26 x1 Ceftriaxone 4/26 >> 4/29  Azithromycin 4/26 >> 4/29 Unasyn 4/30 >>  Dose adjustments this admission: N/A  Microbiology results: 4/26 BCx: ngtd 4/26 COVID PCR: neg 4/27 R pleural fluid: ngtd (gram stain neg) 4/28 MRSA PCR: neg   Thank you for allowing pharmacy to be a part of this patient's care.  Kerby Nora, PharmD, BCPS Clinical Pharmacist  Phone: [phone] 09/22/2020 10:45 AM  Please check AMION for all [place] phone numbers After 10:00 PM, call [place] [phone]

## 2020-09-22 NOTE — Progress Notes (Addendum)
NAME:  [name], MRN:  [id number], DOB:  Jul 17, 1940, LOS: 4 ADMISSION DATE:  09/18/2020, CONSULTATION DATE:  09/22/2020 REFERRING MD:  Bonnielee Haff, MD, CHIEF COMPLAINT:  Pleural effusion  History of Present Illness:  The patient is a 80 y.o. woman with history of dementia who present on 4/26 from [place] home by EMS for increased work of breathing and cough.  History is obtained primarily from chart review and attending physician as the patient has dementia and is poor historian.  Family and staff have noted frequent coughing and choking episodes while eating and talking and she has been losing weight unintentionally.  Upon arrival to the ED she was found to be tachycardic, tachypneic, with leukocytosis.  CT chest demonstrated loculated right basilar effusion.  CT surgery was consulted to evaluate this effusion and they recommended IR guided chest tube drainage.  Chest tube was placed by IR on 4/27.  About 600 cc of serous fluid has been drained so far.  Chest x-ray yesterday showed persistent loculation on the right base of the lung.  Pulmonary is being consulted today to manage the pleural effusion and chest tube.  This morning the patient has no complaints she denies any respiratory distress.  She has been evaluated by speech and is on a modified dysphagia 2 diet.  Pertinent  Medical History  Breast cancer on anastrazole,  Dementia Heart failure reduced ejection fraction COPD Chronic respiratory failure on [facility] Significant Hospital Events: Including procedures, antibiotic start and stop dates in addition to other pertinent events   . 4/26 admitted for loculated effusion, CTCS consulted, recommend IR guided chest tube placement . 4/27 chest tube placed by IR . 4/28 patient with persistent loculations . 4/29 pulmonary consulted to manage chest tube and effusion . 4/30 chest tube output 670 cc last 24h  Interim History / Subjective:  Episode of chest discomfort shortness  of breath 4/29 afternoon about 2 hours after her lytics were instilled, now resolved Patient is comfortable on 4L/min Denies any pain, intermittent dyspnea 670 cc out of her chest tube last 24 hours I do not see any notes to indicate that she got the DNase/lytics last night, only once yesterday 4/29   Objective   Blood pressure 138/85, pulse (!) 108, temperature 97.7 F (36.5 C), temperature source Oral, resp. rate 20, height 5\' 2"  (1.575 m), weight 49.7 kg, SpO2 94 %.    FiO2 (%):  [32 %-36 %] 36 %   Intake/Output Summary (Last 24 hours) at 09/22/2020 1021 Last data filed at 09/22/2020 0458 Gross per 24 hour  Intake 240 ml  Output 1020 ml  Net -780 ml   Filed Weights   09/20/20 0232 09/21/20 0502 09/22/20 0455  Weight: 50.9 kg 50.9 kg 49.7 kg    Examination: General: Thin elderly woman, comfortable HENT: Oropharynx clear, strong voice, no stridor Lungs: Decreased on the right, left clear Cardiovascular: Regular, distant, no murmur Abdomen: Nondistended, positive bowel sounds Extremities: No edema Neuro: Pleasant, oriented to self and place, does not understand medical situation. MSK: no deformity Skin: No rash  Labs/imaging that I havepersonally reviewed  (right click and "Reselect all SmartList Selections" daily)   Increased leukocytosis, WBC 24.6 <<12.9 Pleural fluid culture negative so far, smear negative Last chest x-ray was 4/29  Resolved Hospital Problem list     Assessment & Plan:  [name] is a 80 y.o. woman with history of dementia and aspiration who presents with:  Complex Parapneumonic Effusion due to aspiration pneumonia,  likely polymicrobial -Continue to follow culture data from pleural fluid, negative so far -Repeat DNase, tPA instillation via chest tube twice daily, plan for 2 days total.  I instilled DNase/tPA via chest tube stopcock in sterile fashion at 10:15 AM today 4/30.  Plan for removal 12:15 PM -Repeat chest x-ray on 5/2 to assess for  persistent loculation -Antibiotics narrowed to Unasyn    Labs   CBC: Recent Labs  Lab 09/18/20 0800 09/19/20 0729 09/20/20 0341 09/21/20 0410 09/22/20 0202  WBC 47.1* 39.8* 28.3* 12.9* 24.6*  NEUTROABS  --  36.5*  --   --   --   HGB 12.8 11.8* 10.8* 11.3* 12.9  HCT 39.5 35.8* 32.4* 35.3* 40.1  MCV 93.8 89.7 90.3 92.2 92.2  PLT 410* 400 336 362 976    Basic Metabolic Panel: Recent Labs  Lab 09/18/20 0945 09/19/20 0729 09/20/20 0341 09/21/20 0410 09/22/20 0202  NA 135 139 138 138 135  K 5.6* 3.8 3.3* 3.5 4.4  CL 100 107 101 102 98  CO2 18* 23 26 26 24   GLUCOSE 104* 139* 123* 124* 121*  BUN 29* 25* 19 16 13   CREATININE 0.93 0.75 0.68 0.57 0.66  CALCIUM 9.2 8.7* 8.6* 8.6* 8.3*   GFR: Estimated Creatinine Clearance: 44.7 mL/min (by C-G formula based on SCr of 0.66 mg/dL). Recent Labs  Lab 09/18/20 1003 09/19/20 0729 09/20/20 0341 09/21/20 0410 09/22/20 0202  PROCALCITON  --   --  0.81 0.49  --   WBC  --  39.8* 28.3* 12.9* 24.6*  LATICACIDVEN 1.7  --   --   --   --     Liver Function Tests: Recent Labs  Lab 09/18/20 0945  AST QUANTITY NOT SUFFICIENT, UNABLE TO PERFORM TEST  ALT 6  ALKPHOS 172*  BILITOT 0.1*  PROT 7.2  ALBUMIN 2.6*    ABG    Component Value Date/Time   PHART 7.296 (L) 07/08/2013 0214   PCO2ART 38.1 07/08/2013 0214   PO2ART 60.0 (L) 07/08/2013 0214   HCO3 18.7 (L) 07/08/2013 0214   TCO2 20 07/08/2013 0214   ACIDBASEDEF 7.0 (H) 07/08/2013 0214   O2SAT 89.0 07/08/2013 0214

## 2020-09-22 NOTE — Evaluation (Signed)
Physical Therapy Evaluation Patient Details Name: [name] MRN: [id number] DOB: [birth year] Today's Date: 09/22/2020   History of Present Illness  Pt is a 80 y.o. female who presented 4/26 with SOB, increased choking and coughing with eating and talking, and weight loss. Of note, pt in ED 2 days PTA with a fall and complaints of chest pain. CT chest demonstrated loculated right basilar effusion. S/p R chest tube placed. PMH: dementia, osteopenia, hyperthyroidism, hypercholesterolemia, COPD, CHF, cancer, arthritis, and anxiety.    Clinical Impression  Pt presents with condition above and deficits mentioned below, see PT Problem List. PTA she lives in an ALF with 24 hour support as needed for ADL, meds, and mobility at 4WRW level. Currently she is needing up to Mod A for bed mobility and functional transfers. Unable to tolerate ambulating other than marching in place a couple reps with a RW this date due to lethargy and pain. Pt demonstrated generalized weakness and balance deficits that place her at risk for falls. Pt is also limited in mobility by her R chest pain, but is still agreeable to attempt mobility despite the pain. Will continue to follow acutely. Pt would benefit greatly from return to her ALF with HHPT as she has dementia and would benefit from being in a familiar environment.    Follow Up Recommendations Home health PT;Supervision/Assistance - 24 hour    Equipment Recommendations  Other (comment) (may need a RW if unable to safely manage her rollator)    Recommendations for Other Services       Precautions / Restrictions Precautions Precautions: Fall Precaution Comments: R chest tube, monitor SpO2 Restrictions Weight Bearing Restrictions: No      Mobility  Bed Mobility Overal bed mobility: Needs Assistance Bed Mobility: Supine to Sit;Sit to Supine;Rolling Rolling: Min assist   Supine to sit: Mod assist Sit to supine: Mod assist   General bed mobility comments: Pt  rolls with minA and extra time to process simple cues to reach to contralateral rail. ModA to manage legs and provide HHA to pull trunk to ascend and scoot hips anteriorly using bed pad to sit EOB. Cues provided to 'walk' legs off EOB, mod success. ModA to safely manage trunk and chest tube back to bed.    Transfers Overall transfer level: Needs assistance Equipment used: 1 person hand held assist Transfers: Sit to/from Stand Sit to Stand: Mod assist         General transfer comment: Pt cued to place UEs on PT's forearms and stand, modA to obtain full upright posture. Pt immediately reaching R hand back for bed rail for support despite cues to keep hands on PT, thus switched out for pt to hold onto RW due to it being a more familiar device, with success as pt kept hands on RW.  Ambulation/Gait Ambulation/Gait assistance: Min assist   Assistive device: Rolling walker (2 wheeled)       General Gait Details: Pre-gait training, cuing pt to march in place EOB with RW, pt began to march x2 steps but then would cease and begin to try to sit repeatedly. Pt with narrow BOS and posterior lean.  Stairs            Wheelchair Mobility    Modified Rankin (Stroke Patients Only)       Balance Overall balance assessment: Needs assistance Sitting-balance support: Feet supported;Single extremity supported;Bilateral upper extremity supported Sitting balance-Leahy Scale: Poor Sitting balance - Comments: Pt reliant on UE support as she tends to lean  posteriorly enough to lift feet slightly off ground, min guard-minA for static sitting balance. Postural control: Posterior lean;Right lateral lean Standing balance support: Bilateral upper extremity supported Standing balance-Leahy Scale: Poor Standing balance comment: Reliant on bil UE support and external assist.                             Pertinent Vitals/Pain Pain Assessment: Faces Faces Pain Scale: Hurts even more Pain  Location: in her R side when moving Pain Descriptors / Indicators: Grimacing;Guarding;Moaning Pain Intervention(s): Limited activity within patient's tolerance;Monitored during session;Repositioned;Premedicated before session    Home Living Family/patient expects to be discharged to:: Assisted living               Home Equipment: Walker - 4 wheels;Shower seat;Grab bars - toilet;Grab bars - tub/shower;Hand held shower head Additional Comments: Apartment    Prior Function Level of Independence: Needs assistance   Gait / Transfers Assistance Needed: Patient walks to the common dining room with 4WRW and supervision from staff.  She is on the same level as the dining room.  ADL's / Homemaking Assistance Needed: Patient has assist for showers, dresses herself with staff supervision.  Able to complete grooming with supervision.  Comments: Uses rollator     Hand Dominance   Dominant Hand: Right    Extremity/Trunk Assessment   Upper Extremity Assessment Upper Extremity Assessment: Defer to OT evaluation    Lower Extremity Assessment Lower Extremity Assessment: Generalized weakness    Cervical / Trunk Assessment Cervical / Trunk Assessment: Kyphotic  Communication   Communication: No difficulties  Cognition Arousal/Alertness: Awake/alert Behavior During Therapy: WFL for tasks assessed/performed Overall Cognitive Status: History of cognitive impairments - at baseline                                 General Comments: Granddaughter reports pt is slower to respond and a bit more confused than normal, but at baseline sometimes even forgets who the granddaughter is. At baseline, oriented to self only. Pt falling asleep intermittently when supine this date.      General Comments General comments (skin integrity, edema, etc.): 3L O2 via [place], SpO2 as low as 90%    Exercises     Assessment/Plan    PT Assessment Patient needs continued PT services  PT Problem List  Decreased strength;Decreased activity tolerance;Decreased range of motion;Decreased balance;Decreased mobility;Decreased cognition;Decreased coordination;Decreased knowledge of use of DME;Decreased safety awareness;Decreased knowledge of precautions;Decreased skin integrity       PT Treatment Interventions DME instruction;Gait training;Functional mobility training;Therapeutic activities;Balance training;Therapeutic exercise;Neuromuscular re-education;Cognitive remediation;Patient/family education    PT Goals (Current goals can be found in the Care Plan section)  Acute Rehab PT Goals Patient Stated Goal: Keep her pain controlled and to return to ALF PT Goal Formulation: With patient/family Time For Goal Achievement: 10/06/20 Potential to Achieve Goals: Good    Frequency Min 3X/week   Barriers to discharge        Co-evaluation               AM-PAC PT "6 Clicks" Mobility  Outcome Measure Help needed turning from your back to your side while in a flat bed without using bedrails?: A Little Help needed moving from lying on your back to sitting on the side of a flat bed without using bedrails?: A Lot Help needed moving to and from a bed to a chair (  including a wheelchair)?: A Lot Help needed standing up from a chair using your arms (e.g., wheelchair or bedside chair)?: A Lot Help needed to walk in hospital room?: A Lot Help needed climbing 3-5 steps with a railing? : Total 6 Click Score: 12    End of Session Equipment Utilized During Treatment: Oxygen Activity Tolerance: Patient limited by lethargy;Patient limited by pain (pt falling asleep intermittently when supine) Patient left: in bed;with call bell/phone within reach;with bed alarm set;with family/visitor present Nurse Communication: Mobility status PT Visit Diagnosis: Unsteadiness on feet (R26.81);Other abnormalities of gait and mobility (R26.89);Muscle weakness (generalized) (M62.81);History of falling (Z91.81);Difficulty in  walking, not elsewhere classified (R26.2);Pain Pain - Right/Left: Right Pain - part of body:  (chest tube insertion)    Time: 2122-4825 PT Time Calculation (min) (ACUTE ONLY): 28 min   Charges:   PT Evaluation $PT Eval Moderate Complexity: 1 Mod PT Treatments $Therapeutic Activity: 8-22 mins        Moishe Spice, PT, DPT Acute Rehabilitation Services  Pager: [phone] Office: [phone]   Orvan Falconer 09/22/2020, 5:44 PM

## 2020-09-22 NOTE — Progress Notes (Signed)
[facility] PROGRESS NOTE   [name] GYI:[id number] DOB: November 23, 1940 DOA: 09/18/2020  PCP: [name], MD  Brief History/Interval Summary: 80 y.o. female with medical history significant of COPD,  chronic systolic CHF 35-00%, hypothyroidism, breast cancer s/p left mastectomy presents from [facility] due to shortness of breath.    Patient with history of dementia and so history was limited.  Supposed to be on home oxygen at 2 L/min.  Presented with shortness of breath.  Was found to have a loculated right-sided pleural effusion.  Hospitalized for further management.    Consultants: Cardiothoracic surgery.  Interventional radiology.  Pulmonology  Procedures: Right-sided chest tube placement  Antibiotics: Anti-infectives (From admission, onward)   Start     Dose/Rate Route Frequency Ordered Stop   09/19/20 1100  vancomycin (VANCOREADY) IVPB 750 mg/150 mL  Status:  Discontinued        750 mg 150 mL/hr over 60 Minutes Intravenous Every 24 hours 09/18/20 1255 09/21/20 1038   09/18/20 1600  cefTRIAXone (ROCEPHIN) 2 g in sodium chloride 0.9 % 100 mL IVPB        2 g 200 mL/hr over 30 Minutes Intravenous Every 24 hours 09/18/20 1526 09/23/20 1559   09/18/20 1600  azithromycin (ZITHROMAX) 500 mg in sodium chloride 0.9 % 250 mL IVPB        500 mg 250 mL/hr over 60 Minutes Intravenous Every 24 hours 09/18/20 1526 09/23/20 1559   09/18/20 0945  vancomycin (VANCOREADY) IVPB 1250 mg/250 mL        1,250 mg 166.7 mL/hr over 90 Minutes Intravenous  Once 09/18/20 0932 09/18/20 1334   09/18/20 0930  ceFEPIme (MAXIPIME) 2 g in sodium chloride 0.9 % 100 mL IVPB        2 g 200 mL/hr over 30 Minutes Intravenous  Once 09/18/20 0926 09/18/20 1043      Subjective/Interval History: Patient remains confused and distracted.  Occasional pain in the tube insertion site is present.  She denies any chest pain or shortness of breath currently.  No nausea vomiting.  Discussed with nursing staff     Assessment/Plan:  Loculated right-sided pleural effusion/severe sepsis present on admission/acute respiratory failure with hypoxia Apparently has had some coughing episodes with eating and drinking raising concern for aspiration.  Due to loculated effusion cardiothoracic surgery was consulted.  Seen by Dr. Cyndia Bent.  Not thought to be a surgical candidate.  He recommended evaluation by IR.  Discussed with patient's son who was agreeable to procedures to drain this collection.  Patient underwent chest tube placement on 4/27. Gram stain did not show any organisms.  Total WBC was 1264.  Cultures are negative so far.  Vancomycin was discontinued.  Patient was continued on ceftriaxone and azithromycin.  Procalcitonin level improved to 0.49.  WBC had improved but noted to be higher today.  As noted.  We will change her to Unasyn.   Appreciate pulmonology assistance with management of chest tube.  Dornase/tPA has been instilled. Continues to require 3 to 4 L of oxygen.  Keep saturations greater than 90% wean down as tolerated.  Chest pain and dyspnea Symptoms likely due to her pleural effusion.  No ischemic findings noted on EKG.  Continue to treat symptomatically for now.  She is not a candidate for aggressive work-up.    COPD with acute exacerbation Seems to have improved with steroids and nebulizer treatments.    Mildly elevated troponin Likely due to demand ischemia.  EKG without any acute changes.  Normocytic anemia Hemoglobin is stable.  No evidence of overt blood loss.  Chronic systolic CHF Last echocardiogram showed a EF of 40 to 45%.  Does not appear to be volume overloaded.   Essential hypertension Continue home medications.  Monitor blood pressures.  Potassium abnormalities Initially hyperkalemic and then subsequently hypokalemic.  Noted to be normal this morning.  History of dementia Patient apparently has significant dementia per family.  Reorient daily.  Delirium  precautions.  Hypothyroidism TSH noted to be normal.  Continue levothyroxine.  History of breast cancer Seems to be stable.  Noted to be on anastrozole.  History of GERD Continue Pepcid.  Goals of care Pulmonology discussed with patient's family yesterday.  They are interested in pursuing palliative care who have been consulted.  DVT Prophylaxis: Lovenox Code Status: DNR Family Communication: Son was updated by pulmonology yesterday.  Palliative care has been consulted. Disposition Plan: Hopefully return home when improved.  PT and OT evaluation.  Status is: Inpatient  Remains inpatient appropriate because:IV treatments appropriate due to intensity of illness or inability to take PO and Inpatient level of care appropriate due to severity of illness   Dispo: The patient is from: Home              Anticipated d/c is to: Home              Patient currently is not medically stable to d/c.   Difficult to place patient No       Medications:  Scheduled: . alteplase (TPA) for intrapleural administration  10 mg Intrapleural BID   And  . pulmozyme (DORNASE) for intrapleural administration  5 mg Intrapleural BID  . anastrozole  1 mg Oral Daily  . arformoterol  15 mcg Nebulization BID  . atorvastatin  40 mg Oral QHS  . budesonide (PULMICORT) nebulizer solution  0.5 mg Nebulization BID  . carvedilol  3.125 mg Oral BID WC  . enoxaparin (LOVENOX) injection  40 mg Subcutaneous Q24H  . famotidine  40 mg Oral QHS  . gabapentin  200 mg Oral BID  . ipratropium-albuterol  3 mL Nebulization TID  . levothyroxine  25 mcg Oral QAC breakfast  . memantine  10 mg Oral BID  . mirabegron ER  25 mg Oral Daily  . venlafaxine XR  150 mg Oral Q breakfast   Continuous: . azithromycin 500 mg (09/21/20 1501)  . cefTRIAXone (ROCEPHIN)  IV 2 g (09/21/20 1647)   [id number], albuterol, morphine injection, ondansetron (ZOFRAN) IV, oxyCODONE, Resource ThickenUp Clear   Objective:  Vital  Signs  Vitals:   09/21/20 1928 09/21/20 1942 09/22/20 0455 09/22/20 0915  BP: 125/67  138/85   Pulse: (!) 101 (!) 103 (!) 108   Resp: 17 18 20    Temp: 97.7 F (36.5 C)  97.7 F (36.5 C)   TempSrc: Oral  Oral   SpO2: 94% 92% 91% 94%  Weight:   49.7 kg   Height:        Intake/Output Summary (Last 24 hours) at 09/22/2020 1018 Last data filed at 09/22/2020 0458 Gross per 24 hour  Intake 240 ml  Output 1020 ml  Net -780 ml   Filed Weights   09/20/20 0232 09/21/20 0502 09/22/20 0455  Weight: 50.9 kg 50.9 kg 49.7 kg    General appearance: Awake alert.  In no distress.  Pleasantly confused Resp: Chest tube noted on the right.  Diminished air entry at the right base.  Few crackles.  No wheezing. Cardio: S1-S2 is normal  regular.  No S3-S4.  No rubs murmurs or bruit GI: Abdomen is soft.  Nontender nondistended.  Bowel sounds are present normal.  No masses organomegaly Extremities: No edema.  Able to move all of her extremities Neurologic: Disoriented.  No focal neurological deficits.      Lab Results:  Data Reviewed: I have personally reviewed following labs and imaging studies  CBC: Recent Labs  Lab 09/18/20 0800 09/19/20 0729 09/20/20 0341 09/21/20 0410 09/22/20 0202  WBC 47.1* 39.8* 28.3* 12.9* 24.6*  NEUTROABS  --  36.5*  --   --   --   HGB 12.8 11.8* 10.8* 11.3* 12.9  HCT 39.5 35.8* 32.4* 35.3* 40.1  MCV 93.8 89.7 90.3 92.2 92.2  PLT 410* 400 336 362 213    Basic Metabolic Panel: Recent Labs  Lab 09/18/20 0945 09/19/20 0729 09/20/20 0341 09/21/20 0410 09/22/20 0202  NA 135 139 138 138 135  K 5.6* 3.8 3.3* 3.5 4.4  CL 100 107 101 102 98  CO2 18* 23 26 26 24   GLUCOSE 104* 139* 123* 124* 121*  BUN 29* 25* 19 16 13   CREATININE 0.93 0.75 0.68 0.57 0.66  CALCIUM 9.2 8.7* 8.6* 8.6* 8.3*    GFR: Estimated Creatinine Clearance: 44.7 mL/min (by C-G formula based on SCr of 0.66 mg/dL).  Liver Function Tests: Recent Labs  Lab 09/18/20 0945  AST QUANTITY  NOT SUFFICIENT, UNABLE TO PERFORM TEST  ALT 6  ALKPHOS 172*  BILITOT 0.1*  PROT 7.2  ALBUMIN 2.6*      Recent Results (from the past 240 hour(s))  Resp Panel by RT-PCR (Flu A&B, Covid) Nasopharyngeal Swab     Status: None   Collection Time: 09/18/20  8:00 AM   Specimen: Nasopharyngeal Swab; Nasopharyngeal(NP) swabs in vial transport medium  Result Value Ref Range Status   SARS Coronavirus 2 by RT PCR NEGATIVE NEGATIVE Final    Comment: (NOTE) SARS-CoV-2 target nucleic acids are NOT DETECTED.  The SARS-CoV-2 RNA is generally detectable in upper respiratory specimens during the acute phase of infection. The lowest concentration of SARS-CoV-2 viral copies this assay can detect is 138 copies/mL. A negative result does not preclude SARS-Cov-2 infection and should not be used as the sole basis for treatment or other patient management decisions. A negative result may occur with  improper specimen collection/handling, submission of specimen other than nasopharyngeal swab, presence of viral mutation(s) within the areas targeted by this assay, and inadequate number of viral copies(<138 copies/mL). A negative result must be combined with clinical observations, patient history, and epidemiological information. The expected result is Negative.  Fact Sheet for Patients:  EntrepreneurPulse.com.au  Fact Sheet for Healthcare Providers:  IncredibleEmployment.be  This test is no t yet approved or cleared by the [place] FDA and  has been authorized for detection and/or diagnosis of SARS-CoV-2 by FDA under an Emergency Use Authorization (EUA). This EUA will remain  in effect (meaning this test can be used) for the duration of the COVID-19 declaration under Section 564(b)(1) of the Act, 21 U.S.C.section 360bbb-3(b)(1), unless the authorization is terminated  or revoked sooner.       Influenza A by PCR NEGATIVE NEGATIVE Final   Influenza B by PCR  NEGATIVE NEGATIVE Final    Comment: (NOTE) The Xpert Xpress SARS-CoV-2/FLU/RSV plus assay is intended as an aid in the diagnosis of influenza from Nasopharyngeal swab specimens and should not be used as a sole basis for treatment. Nasal washings and aspirates are unacceptable for Xpert Xpress SARS-CoV-2/FLU/RSV  testing.  Fact Sheet for Patients: EntrepreneurPulse.com.au  Fact Sheet for Healthcare Providers: IncredibleEmployment.be  This test is not yet approved or cleared by the [place] FDA and has been authorized for detection and/or diagnosis of SARS-CoV-2 by FDA under an Emergency Use Authorization (EUA). This EUA will remain in effect (meaning this test can be used) for the duration of the COVID-19 declaration under Section 564(b)(1) of the Act, 21 U.S.C. section 360bbb-3(b)(1), unless the authorization is terminated or revoked.  Performed at [facility], [place] [address] [place], [place] [address]   Blood culture (routine x 2)     Status: None (Preliminary result)   Collection Time: 09/18/20  9:22 AM   Specimen: BLOOD  Result Value Ref Range Status   Specimen Description BLOOD BLOOD RIGHT FOREARM  Final   Special Requests   Final    BOTTLES DRAWN AEROBIC AND ANAEROBIC Blood Culture results may not be optimal due to an inadequate volume of blood received in culture bottles   Culture   Final    NO GROWTH 4 DAYS Performed at [facility], [place] [address], [place], [place] [address]    Report Status PENDING  Incomplete  Blood culture (routine x 2)     Status: None (Preliminary result)   Collection Time: 09/18/20  9:27 AM   Specimen: BLOOD  Result Value Ref Range Status   Specimen Description BLOOD RIGHT ANTECUBITAL  Final   Special Requests   Final    BOTTLES DRAWN AEROBIC AND ANAEROBIC Blood Culture results may not be optimal due to an inadequate volume of blood received in culture bottles   Culture   Final    NO  GROWTH 4 DAYS Performed at [facility], [place] [address] [place], [place] [address]    Report Status PENDING  Incomplete  Culture, body fluid w Gram Stain-bottle     Status: None (Preliminary result)   Collection Time: 09/19/20  4:46 PM   Specimen: Fluid  Result Value Ref Range Status   Specimen Description FLUID PLEURAL RIGHT  Final   Special Requests BOTTLES DRAWN AEROBIC AND ANAEROBIC  Final   Culture   Final    NO GROWTH 3 DAYS Performed at [facility], [place] [address], [place], [place] [address]    Report Status PENDING  Incomplete  Gram stain     Status: None   Collection Time: 09/19/20  4:46 PM   Specimen: Fluid  Result Value Ref Range Status   Specimen Description FLUID PLEURAL RIGHT  Final   Special Requests NONE  Final   Gram Stain   Final    RARE WBC PRESENT, PREDOMINANTLY PMN NO ORGANISMS SEEN Performed at [facility], [place] [address], [place], [place] [address]    Report Status 09/20/2020 FINAL  Final  MRSA PCR Screening     Status: None   Collection Time: 09/20/20  4:30 AM   Specimen: Nasal Mucosa; Nasopharyngeal  Result Value Ref Range Status   MRSA by PCR NEGATIVE NEGATIVE Final    Comment:        The GeneXpert MRSA Assay (FDA approved for NASAL specimens only), is one component of a comprehensive MRSA colonization surveillance program. It is not intended to diagnose MRSA infection nor to guide or monitor treatment for MRSA infections. Performed at [facility], [place] [address] [place], [place] [address]       Radiology Studies: DG Chest [facility] 1 View  Result Date:  09/21/2020 CLINICAL DATA:  Right-sided chest tube.  Worsening chest pain. EXAM: PORTABLE CHEST 1 VIEW COMPARISON:  Earlier today at 0800 hours. FINDINGS: Right-sided small bore chest tube is unchanged in position. Midline trachea. Normal heart size. Atherosclerosis in the transverse aorta. A moderate amount of loculated pleural fluid may be slightly increased. No  pneumothorax. No left-sided pleural fluid. Worsening right base aeration. Clear left lung. IMPRESSION: Right-sided chest tube remains in place with minimal increase in right-sided loculated pleural fluid and adjacent basilar airspace disease. Aortic Atherosclerosis (ICD10-I70.0). Electronically Signed   By: Abigail Miyamoto M.D.   On: 09/21/2020 14:01   DG Chest Port 1 View  Result Date: 09/21/2020 CLINICAL DATA:  Chest tube in place.  Pleural effusion. EXAM: PORTABLE CHEST 1 VIEW COMPARISON:  September 20, 2020 chest radiograph; chest CT September 18, 2020 FINDINGS: Chest tube position unchanged on the right. No evident pneumothorax. Loculated pleural effusion on the right again noted with much smaller free-flowing component on the right. Left lung clear. Heart size and pulmonary vascularity normal. No adenopathy no bone lesions. IMPRESSION: Stable chest tube position on the right without evident pneumothorax. Area of loculated pleural effusion on the right persists, similar to 1 day prior with smaller free-flowing component on the right. No new opacity evident. Left lung clear. Stable cardiac silhouette. Electronically Signed   By: Lowella Grip III M.D.   On: 09/21/2020 08:52   DG CHEST PORT 1 VIEW  Result Date: 09/20/2020 CLINICAL DATA:  Right-sided chest tube. EXAM: PORTABLE CHEST 1 VIEW COMPARISON:  09/19/2020.  CT 09/18/2020. FINDINGS: Right chest tube noted over the right lower chest. Prominent rounded density, most likely loculated pleural fluid again noted over the right chest base. Tiny amount of free pleural air may be present over the right lung base at the site of chest tube insertion. No prominent pneumothorax noted. Follow-up chest x-ray suggested for continued evaluation. Calcified nodule right upper lobe most likely granuloma. Heart size stable. No acute bony abnormality. IMPRESSION: Right chest tube noted over the right lower chest. Prominent rounded density, most likely pleural fluid again noted  over the right chest base. No significant interim change in appearance. Tiny amount of free pleural air may be present over the right lung base at the site of chest tube insertion. No prominent pneumothorax noted. Follow-up chest x-ray suggested for continued evaluation. These results will be called to the ordering clinician or representative by the Radiologist Assistant, and communication documented in the PACS or [employer]. Electronically Signed   By: Marcello Moores  Register   On: 09/20/2020 12:06       LOS: 4 days   [facility] Pager on [email or web address]  09/22/2020, 10:18 AM

## 2020-09-23 DIAGNOSIS — I5032 Chronic diastolic (congestive) heart failure: Secondary | ICD-10-CM | POA: Diagnosis not present

## 2020-09-23 DIAGNOSIS — J90 Pleural effusion, not elsewhere classified: Secondary | ICD-10-CM | POA: Diagnosis not present

## 2020-09-23 DIAGNOSIS — J189 Pneumonia, unspecified organism: Secondary | ICD-10-CM | POA: Diagnosis not present

## 2020-09-23 DIAGNOSIS — F0391 Unspecified dementia with behavioral disturbance: Secondary | ICD-10-CM | POA: Diagnosis not present

## 2020-09-23 DIAGNOSIS — J441 Chronic obstructive pulmonary disease with (acute) exacerbation: Secondary | ICD-10-CM | POA: Diagnosis not present

## 2020-09-23 LAB — CBC
HCT: 41.5 % (ref 36.0–46.0)
Hemoglobin: 13.1 g/dL (ref 12.0–15.0)
MCH: 29.2 pg (ref 26.0–34.0)
MCHC: 31.6 g/dL (ref 30.0–36.0)
MCV: 92.6 fL (ref 80.0–100.0)
Platelets: 350 K/uL (ref 150–400)
RBC: 4.48 MIL/uL (ref 3.87–5.11)
RDW: 13.5 % (ref 11.5–15.5)
WBC: 32.6 K/uL — ABNORMAL HIGH (ref 4.0–10.5)
nRBC: 0 % (ref 0.0–0.2)

## 2020-09-23 LAB — BASIC METABOLIC PANEL WITH GFR
Anion gap: 11 (ref 5–15)
BUN: 23 mg/dL (ref 8–23)
CO2: 27 mmol/L (ref 22–32)
Calcium: 8.5 mg/dL — ABNORMAL LOW (ref 8.9–10.3)
Chloride: 98 mmol/L (ref 98–111)
Creatinine, Ser: 0.97 mg/dL (ref 0.44–1.00)
GFR, Estimated: 59 mL/min — ABNORMAL LOW (ref >60)
Glucose, Bld: 173 mg/dL — ABNORMAL HIGH (ref 70–99)
Potassium: 3.7 mmol/L (ref 3.5–5.1)
Sodium: 136 mmol/L (ref 135–145)

## 2020-09-23 LAB — CULTURE, BLOOD (ROUTINE X 2)
Culture: NO GROWTH
Culture: NO GROWTH

## 2020-09-23 MED ORDER — DOCUSATE SODIUM 100 MG PO CAPS
100.0000 mg | ORAL_CAPSULE | Freq: Two times a day (BID) | ORAL | Status: DC
  Administered 2020-09-23 – 2020-10-01 (×17): 100 mg via ORAL
  Filled 2020-09-23 (×17): qty 1

## 2020-09-23 MED ORDER — POLYETHYLENE GLYCOL 3350 17 G PO PACK
17.0000 g | PACK | Freq: Every day | ORAL | Status: DC
  Administered 2020-09-23 – 2020-10-01 (×8): 17 g via ORAL
  Filled 2020-09-23 (×9): qty 1

## 2020-09-23 MED ORDER — POTASSIUM CHLORIDE 20 MEQ PO PACK
40.0000 meq | PACK | Freq: Once | ORAL | Status: AC
  Administered 2020-09-23: 40 meq via ORAL
  Filled 2020-09-23: qty 2

## 2020-09-23 NOTE — Progress Notes (Signed)
80 year old female s/p right chest tube placement with IR on 09/20/20.   Patient is undergoing tPA instillation via chest tube per PCCM. Spoke with Dr. Lamonte Sakai,  the chest tube will be managed by PCCM.   Please call IR for questions and concerns regarding the chest tube.    Armando Gang Caleb Decock PA-C 09/23/2020 8:49 AM

## 2020-09-23 NOTE — Progress Notes (Signed)
NAME:  [name], MRN:  [id number], DOB:  03-03-1941, LOS: 5 ADMISSION DATE:  09/18/2020, CONSULTATION DATE:  09/23/2020 REFERRING MD:  Bonnielee Haff, MD, CHIEF COMPLAINT:  Pleural effusion  History of Present Illness:  The patient is a 80 y.o. woman with history of dementia who present on 4/26 from [facility] home by EMS for increased work of breathing and cough.  History is obtained primarily from chart review and attending physician as the patient has dementia and is poor historian.  Family and staff have noted frequent coughing and choking episodes while eating and talking and she has been losing weight unintentionally.  Upon arrival to the ED she was found to be tachycardic, tachypneic, with leukocytosis.  CT chest demonstrated loculated right basilar effusion.  CT surgery was consulted to evaluate this effusion and they recommended IR guided chest tube drainage.  Chest tube was placed by IR on 4/27.  About 600 cc of serous fluid has been drained so far.  Chest x-ray yesterday showed persistent loculation on the right base of the lung.  Pulmonary is being consulted today to manage the pleural effusion and chest tube.  This morning the patient has no complaints she denies any respiratory distress.  She has been evaluated by speech and is on a modified dysphagia 2 diet.  Pertinent  Medical History  Breast cancer on anastrazole,  Dementia Heart failure reduced ejection fraction COPD Chronic respiratory failure on [facility] Significant Hospital Events: Including procedures, antibiotic start and stop dates in addition to other pertinent events   . 4/26 admitted for loculated effusion, CTCS consulted, recommend IR guided chest tube placement . 4/27 chest tube placed by IR . 4/28 patient with persistent loculations . 4/29 pulmonary consulted to manage chest tube and effusion . 4/30 chest tube output 670 cc last 24h  Interim History / Subjective:   Some R flank and back  pain Tolerated t-PA/DNase yesterday and again last night Good chest tube output >> 2270cc in last 48h WBC rising on Unasyn   Objective   Blood pressure (!) 141/71, pulse 99, temperature 97.6 F (36.4 C), temperature source Oral, resp. rate 19, height 5\' 2"  (1.575 m), weight 49.8 kg, SpO2 94 %.    FiO2 (%):  [36 %] 36 %   Intake/Output Summary (Last 24 hours) at 09/23/2020 0916 Last data filed at 09/23/2020 0800 Gross per 24 hour  Intake 400 ml  Output 1500 ml  Net -1100 ml   Filed Weights   09/21/20 0502 09/22/20 0455 09/23/20 0357  Weight: 50.9 kg 49.7 kg 49.8 kg    Examination: General: Thin elderly woman, comfortable HENT: Oropharynx clear, strong voice, no stridor Lungs: Decreased on the right, left clear Cardiovascular: Regular, distant, no murmur Abdomen: Nondistended, positive bowel sounds Extremities: No edema Neuro: Pleasant, oriented to self and place, does not understand medical situation. MSK: no deformity Skin: No rash  Labs/imaging that I havepersonally reviewed  (right click and "Reselect all SmartList Selections" daily)   WBC still rising, 32.6 Pleural fluid cx negative so far  Resolved Hospital Problem list     Assessment & Plan:  [name] is a 80 y.o. woman with history of dementia and aspiration who presents with:  Complex Parapneumonic Effusion due to aspiration pneumonia, likely polymicrobial -following cx data, negative so far. WBC is rising, ? Inflammation from the t-PA.  -Instilled t-PA/DNase at 9:00 this am 5/1 in sterile fashion, plan to leave for 1h since she has been having pain. Back to  suction at 10:00 -plan repeat CXR on 5/2 -continue Unasyn. Tailor to any new cx data   Labs   CBC: Recent Labs  Lab 09/19/20 0729 09/20/20 0341 09/21/20 0410 09/22/20 0202 09/23/20 0215  WBC 39.8* 28.3* 12.9* 24.6* 32.6*  NEUTROABS 36.5*  --   --   --   --   HGB 11.8* 10.8* 11.3* 12.9 13.1  HCT 35.8* 32.4* 35.3* 40.1 41.5  MCV 89.7 90.3  92.2 92.2 92.6  PLT 400 336 362 375 626    Basic Metabolic Panel: Recent Labs  Lab 09/19/20 0729 09/20/20 0341 09/21/20 0410 09/22/20 0202 09/23/20 0215  NA 139 138 138 135 136  K 3.8 3.3* 3.5 4.4 3.7  CL 107 101 102 98 98  CO2 23 26 26 24 27   GLUCOSE 139* 123* 124* 121* 173*  BUN 25* 19 16 13 23   CREATININE 0.75 0.68 0.57 0.66 0.97  CALCIUM 8.7* 8.6* 8.6* 8.3* 8.5*   GFR: Estimated Creatinine Clearance: 37 mL/min (by C-G formula based on SCr of 0.97 mg/dL). Recent Labs  Lab 09/18/20 1003 09/19/20 0729 09/20/20 0341 09/21/20 0410 09/22/20 0202 09/23/20 0215  PROCALCITON  --   --  0.81 0.49  --   --   WBC  --    < > 28.3* 12.9* 24.6* 32.6*  LATICACIDVEN 1.7  --   --   --   --   --    < > = values in this interval not displayed.    Liver Function Tests: Recent Labs  Lab 09/18/20 0945  AST QUANTITY NOT SUFFICIENT, UNABLE TO PERFORM TEST  ALT 6  ALKPHOS 172*  BILITOT 0.1*  PROT 7.2  ALBUMIN 2.6*    ABG    Component Value Date/Time   PHART 7.296 (L) 07/08/2013 0214   PCO2ART 38.1 07/08/2013 0214   PO2ART 60.0 (L) 07/08/2013 0214   HCO3 18.7 (L) 07/08/2013 0214   TCO2 20 07/08/2013 0214   ACIDBASEDEF 7.0 (H) 07/08/2013 0214   O2SAT 89.0 07/08/2013 0214     Baltazar Apo, MD, PhD 09/23/2020, 9:28 AM [facility] [phone] or if no answer before 7:00PM call [phone] For any issues after 7:00PM please call eLink [phone]

## 2020-09-23 NOTE — Progress Notes (Signed)
[facility] PROGRESS NOTE   [name] YHC:[id number] DOB: 09-13-1940 DOA: 09/18/2020  PCP: [name], MD  Brief History/Interval Summary: 80 y.o. female with medical history significant of COPD,  chronic systolic CHF 51-76%, hypothyroidism, breast cancer s/p left mastectomy presents from [facility] due to shortness of breath.    Patient with history of dementia and so history was limited.  Supposed to be on home oxygen at 2 L/min.  Presented with shortness of breath.  Was found to have a loculated right-sided pleural effusion.  Hospitalized for further management.    Consultants: Cardiothoracic surgery.  Interventional radiology.  Pulmonology  Procedures: Right-sided chest tube placement  Antibiotics: Anti-infectives (From admission, onward)   Start     Dose/Rate Route Frequency Ordered Stop   09/22/20 1200  Ampicillin-Sulbactam (UNASYN) 3 g in sodium chloride 0.9 % 100 mL IVPB        3 g 200 mL/hr over 30 Minutes Intravenous Every 8 hours 09/22/20 1050     09/19/20 1100  vancomycin (VANCOREADY) IVPB 750 mg/150 mL  Status:  Discontinued        750 mg 150 mL/hr over 60 Minutes Intravenous Every 24 hours 09/18/20 1255 09/21/20 1038   09/18/20 1600  cefTRIAXone (ROCEPHIN) 2 g in sodium chloride 0.9 % 100 mL IVPB  Status:  Discontinued        2 g 200 mL/hr over 30 Minutes Intravenous Every 24 hours 09/18/20 1526 09/22/20 1025   09/18/20 1600  azithromycin (ZITHROMAX) 500 mg in sodium chloride 0.9 % 250 mL IVPB  Status:  Discontinued        500 mg 250 mL/hr over 60 Minutes Intravenous Every 24 hours 09/18/20 1526 09/22/20 1025   09/18/20 0945  vancomycin (VANCOREADY) IVPB 1250 mg/250 mL        1,250 mg 166.7 mL/hr over 90 Minutes Intravenous  Once 09/18/20 0932 09/18/20 1334   09/18/20 0930  ceFEPIme (MAXIPIME) 2 g in sodium chloride 0.9 % 100 mL IVPB        2 g 200 mL/hr over 30 Minutes Intravenous  Once 09/18/20 0926 09/18/20 1043      Subjective/Interval  History: Patient remains confused and distracted.  Does not remember conversations from the last several days.  Seems to be comfortable.  She does not have any pain in the right side of her chest or at this time.  Denies any nausea or vomiting.     Assessment/Plan:  Loculated right-sided pleural effusion/severe sepsis present on admission/acute respiratory failure with hypoxia Due to loculated effusion cardiothoracic surgery was consulted.  Seen by Dr. Cyndia Bent.  Not thought to be a surgical candidate.  He recommended evaluation by IR.  Discussed with patient's son who was agreeable to procedures to drain this collection.  Patient underwent chest tube placement on 4/27. Gram stain did not show any organisms.  Total WBC in the pleural fluid was 1264.  Cultures are negative so far.  Vancomycin was discontinued.  Patient was continued on ceftriaxone and azithromycin.  Procalcitonin level improved to 0.49.  WBC was improving but over the last 48 hours has been increasing.  Reason for this is not entirely clear but according to pulmonology notes could be due to tPA that she is getting through her chest tube.  She has been afebrile. Her antibiotics were narrowed down to Unasyn yesterday.  Appreciate pulmonology assistance with management of chest tube.  Dornase/tPA instillation per pulmonology. Continues to require 3 to 4 L of oxygen.  Keep  saturations greater than 90% wean down as tolerated. There was some concern for aspiration.  Patient was seen by speech therapy.  Currently on a dysphagia diet.   Chest pain and dyspnea Symptoms likely due to her pleural effusion.  No ischemic findings noted on EKG.  Continue to treat symptomatically for now.  She is not a candidate for aggressive work-up.    COPD with acute exacerbation Seems to have improved with steroids and nebulizer treatments.  No wheezing appreciated on examination.  Mildly elevated troponin Likely due to demand ischemia.  EKG without any  acute changes.  Normocytic anemia Hemoglobin is stable.  No evidence of overt blood loss.  Chronic systolic CHF Last echocardiogram showed a EF of 40 to 45%.  Does not appear to be volume overloaded.   Essential hypertension Continue home medications.  Monitor blood pressures.  Potassium abnormalities Initially hyperkalemic and then subsequently hypokalemic.  Supplement as needed.  History of dementia Patient apparently has significant dementia per family.  Reorient daily.  Delirium precautions.  Hypothyroidism TSH noted to be normal.  Continue levothyroxine.  History of breast cancer Seems to be stable.  Noted to be on anastrozole.  History of GERD Continue Pepcid.  Goals of care Palliative care was consulted for goals of care conversation per family request.    DVT Prophylaxis: Lovenox Code Status: DNR Family Communication: Son being updated regularly.   Disposition Plan: Hopefully return home when improved.  PT and OT recommends home health.  Status is: Inpatient  Remains inpatient appropriate because:IV treatments appropriate due to intensity of illness or inability to take PO and Inpatient level of care appropriate due to severity of illness   Dispo: The patient is from: Home              Anticipated d/c is to: Home              Patient currently is not medically stable to d/c.   Difficult to place patient No       Medications:  Scheduled: . alteplase (TPA) for intrapleural administration  10 mg Intrapleural BID   And  . pulmozyme (DORNASE) for intrapleural administration  5 mg Intrapleural BID  . anastrozole  1 mg Oral Daily  . arformoterol  15 mcg Nebulization BID  . atorvastatin  40 mg Oral QHS  . budesonide (PULMICORT) nebulizer solution  0.5 mg Nebulization BID  . carvedilol  3.125 mg Oral BID WC  . docusate sodium  100 mg Oral BID  . enoxaparin (LOVENOX) injection  40 mg Subcutaneous Q24H  . famotidine  40 mg Oral QHS  . gabapentin  200 mg Oral  BID  . ipratropium-albuterol  3 mL Nebulization TID  . levothyroxine  25 mcg Oral QAC breakfast  . memantine  10 mg Oral BID  . mirabegron ER  25 mg Oral Daily  . polyethylene glycol  17 g Oral Daily  . venlafaxine XR  150 mg Oral Q breakfast   Continuous: . ampicillin-sulbactam (UNASYN) IV 3 g (09/23/20 0500)   XBD:[id number], albuterol, morphine injection, ondansetron (ZOFRAN) IV, oxyCODONE, Resource ThickenUp Clear   Objective:  Vital Signs  Vitals:   09/22/20 1417 09/22/20 2017 09/23/20 0357 09/23/20 0831  BP:  (!) 115/98 (!) 141/71   Pulse:  (!) 101 99   Resp:  16 19   Temp:  97.6 F (36.4 C) 97.6 F (36.4 C)   TempSrc:  Oral Oral   SpO2: 92% 94% 93% 94%  Weight:   49.8  kg   Height:        Intake/Output Summary (Last 24 hours) at 09/23/2020 0926 Last data filed at 09/23/2020 0800 Gross per 24 hour  Intake 400 ml  Output 1500 ml  Net -1100 ml   Filed Weights   09/21/20 0502 09/22/20 0455 09/23/20 0357  Weight: 50.9 kg 49.7 kg 49.8 kg    General appearance: Awake alert.  In no distress.  Pleasantly distracted Resp: Diminished air entry on the right with a few crackles.  No wheezing or rhonchi. Chest tube noted on the right. Cardio: S1-S2 is normal regular.  No S3-S4.  No rubs murmurs or bruit GI: Abdomen is soft.  Nontender nondistended.  Bowel sounds are present normal.  No masses organomegaly Extremities: No edema.  Able to move her extremities Neurologic:  No focal neurological deficits.      Lab Results:  Data Reviewed: I have personally reviewed following labs and imaging studies  CBC: Recent Labs  Lab 09/19/20 0729 09/20/20 0341 09/21/20 0410 09/22/20 0202 09/23/20 0215  WBC 39.8* 28.3* 12.9* 24.6* 32.6*  NEUTROABS 36.5*  --   --   --   --   HGB 11.8* 10.8* 11.3* 12.9 13.1  HCT 35.8* 32.4* 35.3* 40.1 41.5  MCV 89.7 90.3 92.2 92.2 92.6  PLT 400 336 362 375 [id number]    Basic Metabolic Panel: Recent Labs  Lab 09/19/20 0729 09/20/20 0341  09/21/20 0410 09/22/20 0202 09/23/20 0215  NA 139 138 138 135 136  K 3.8 3.3* 3.5 4.4 3.7  CL 107 101 102 98 98  CO2 23 26 26 24 27   GLUCOSE 139* 123* 124* 121* 173*  BUN 25* 19 16 13 23   CREATININE 0.75 0.68 0.57 0.66 0.97  CALCIUM 8.7* 8.6* 8.6* 8.3* 8.5*    GFR: Estimated Creatinine Clearance: 37 mL/min (by C-G formula based on SCr of 0.97 mg/dL).  Liver Function Tests: Recent Labs  Lab 09/18/20 0945  AST QUANTITY NOT SUFFICIENT, UNABLE TO PERFORM TEST  ALT 6  ALKPHOS 172*  BILITOT 0.1*  PROT 7.2  ALBUMIN 2.6*      Recent Results (from the past 240 hour(s))  Resp Panel by RT-PCR (Flu A&B, Covid) Nasopharyngeal Swab     Status: None   Collection Time: 09/18/20  8:00 AM   Specimen: Nasopharyngeal Swab; Nasopharyngeal(NP) swabs in vial transport medium  Result Value Ref Range Status   SARS Coronavirus 2 by RT PCR NEGATIVE NEGATIVE Final    Comment: (NOTE) SARS-CoV-2 target nucleic acids are NOT DETECTED.  The SARS-CoV-2 RNA is generally detectable in upper respiratory specimens during the acute phase of infection. The lowest concentration of SARS-CoV-2 viral copies this assay can detect is 138 copies/mL. A negative result does not preclude SARS-Cov-2 infection and should not be used as the sole basis for treatment or other patient management decisions. A negative result may occur with  improper specimen collection/handling, submission of specimen other than nasopharyngeal swab, presence of viral mutation(s) within the areas targeted by this assay, and inadequate number of viral copies(<138 copies/mL). A negative result must be combined with clinical observations, patient history, and epidemiological information. The expected result is Negative.  Fact Sheet for Patients:  EntrepreneurPulse.com.au  Fact Sheet for Healthcare Providers:  IncredibleEmployment.be  This test is no t yet approved or cleared by the [place] FDA  and  has been authorized for detection and/or diagnosis of SARS-CoV-2 by FDA under an Emergency Use Authorization (EUA). This EUA will remain  in effect (meaning  this test can be used) for the duration of the COVID-19 declaration under Section 564(b)(1) of the Act, 21 U.S.C.section 360bbb-3(b)(1), unless the authorization is terminated  or revoked sooner.       Influenza A by PCR NEGATIVE NEGATIVE Final   Influenza B by PCR NEGATIVE NEGATIVE Final    Comment: (NOTE) The Xpert Xpress SARS-CoV-2/FLU/RSV plus assay is intended as an aid in the diagnosis of influenza from Nasopharyngeal swab specimens and should not be used as a sole basis for treatment. Nasal washings and aspirates are unacceptable for Xpert Xpress SARS-CoV-2/FLU/RSV testing.  Fact Sheet for Patients: EntrepreneurPulse.com.au  Fact Sheet for Healthcare Providers: IncredibleEmployment.be  This test is not yet approved or cleared by the [place] FDA and has been authorized for detection and/or diagnosis of SARS-CoV-2 by FDA under an Emergency Use Authorization (EUA). This EUA will remain in effect (meaning this test can be used) for the duration of the COVID-19 declaration under Section 564(b)(1) of the Act, 21 U.S.C. section 360bbb-3(b)(1), unless the authorization is terminated or revoked.  Performed at [facility], [place] [address] [place], [place] [address]   Blood culture (routine x 2)     Status: None (Preliminary result)   Collection Time: 09/18/20  9:22 AM   Specimen: BLOOD  Result Value Ref Range Status   Specimen Description BLOOD BLOOD RIGHT FOREARM  Final   Special Requests   Final    BOTTLES DRAWN AEROBIC AND ANAEROBIC Blood Culture results may not be optimal due to an inadequate volume of blood received in culture bottles   Culture   Final    NO GROWTH 4 DAYS Performed at [facility], [place] [address] [place], [place] [address]    Report  Status PENDING  Incomplete  Blood culture (routine x 2)     Status: None (Preliminary result)   Collection Time: 09/18/20  9:27 AM   Specimen: BLOOD  Result Value Ref Range Status   Specimen Description BLOOD RIGHT ANTECUBITAL  Final   Special Requests   Final    BOTTLES DRAWN AEROBIC AND ANAEROBIC Blood Culture results may not be optimal due to an inadequate volume of blood received in culture bottles   Culture   Final    NO GROWTH 4 DAYS Performed at [facility], [place] [address] [place], [place] [address]    Report Status PENDING  Incomplete  Culture, body fluid w Gram Stain-bottle     Status: None (Preliminary result)   Collection Time: 09/19/20  4:46 PM   Specimen: Fluid  Result Value Ref Range Status   Specimen Description FLUID PLEURAL RIGHT  Final   Special Requests BOTTLES DRAWN AEROBIC AND ANAEROBIC  Final   Culture   Final    NO GROWTH 3 DAYS Performed at [facility], [place] [address], [place], [place] [address]    Report Status PENDING  Incomplete  Gram stain     Status: None   Collection Time: 09/19/20  4:46 PM   Specimen: Fluid  Result Value Ref Range Status   Specimen Description FLUID PLEURAL RIGHT  Final   Special Requests NONE  Final   Gram Stain   Final    RARE WBC PRESENT, PREDOMINANTLY PMN NO ORGANISMS SEEN Performed at [facility], [place] [address] [place], [place] [address]    Report Status 09/20/2020 FINAL  Final  MRSA PCR Screening     Status: None   Collection Time: 09/20/20  4:30  AM   Specimen: Nasal Mucosa; Nasopharyngeal  Result Value Ref Range Status   MRSA by PCR NEGATIVE NEGATIVE Final    Comment:        The GeneXpert MRSA Assay (FDA approved for NASAL specimens only), is one component of a comprehensive MRSA colonization surveillance program. It is not intended to diagnose MRSA infection nor to guide or monitor treatment for MRSA infections. Performed at [facility], [place] [address] [place], [place]  [address]       Radiology Studies: DG Chest Port 1 View  Result Date: 09/21/2020 CLINICAL DATA:  Right-sided chest tube.  Worsening chest pain. EXAM: PORTABLE CHEST 1 VIEW COMPARISON:  Earlier today at 0800 hours. FINDINGS: Right-sided small bore chest tube is unchanged in position. Midline trachea. Normal heart size. Atherosclerosis in the transverse aorta. A moderate amount of loculated pleural fluid may be slightly increased. No pneumothorax. No left-sided pleural fluid. Worsening right base aeration. Clear left lung. IMPRESSION: Right-sided chest tube remains in place with minimal increase in right-sided loculated pleural fluid and adjacent basilar airspace disease. Aortic Atherosclerosis (ICD10-I70.0). Electronically Signed   By: Abigail Miyamoto M.D.   On: 09/21/2020 14:01       LOS: 5 days   [facility] Pager on [email or web address]  09/23/2020, 9:26 AM

## 2020-09-23 NOTE — Progress Notes (Signed)
Daily Progress Note   Patient Name: [name]       Date: 09/23/2020 DOB: 10-06-1940  Age: 80 y.o. MRN#: [id number] Attending Physician: Bonnielee Haff, MD Primary Care Physician: [name], MD Admit Date: 09/18/2020  Reason for Consultation/Follow-up: goals of care, symptom management "patient with dementia and aspiration pneumonia"  Subjective: Chart reviewed. Note that WBC has increased from 24.6 yesterday to 32.6 today. Per pulmonary note, patient has been tolerating tPA/DNase and has good chest tube output in the last 48 hours.   I spoke with son Aaron Edelman and daughter-in-law Santiago Glad by phone. Provided them with a brief update on patient condition. Santiago Glad (who is an Therapist, sports) is concerned about the rising WBC. She inquires whether patient could have a UTI and requests a urine culture. Discussed that this was unlikely,but that we could check a urine culture to be sure.   I let family know that PMT would continue to follow patient and check in as needed. I also encouraged them to reach out with any questions or concerns in the meantime. They express appreciation for PMT support.   Length of Stay: 5  Current Medications: Scheduled Meds:  . alteplase (TPA) for intrapleural administration  10 mg Intrapleural BID   And  . pulmozyme (DORNASE) for intrapleural administration  5 mg Intrapleural BID  . anastrozole  1 mg Oral Daily  . arformoterol  15 mcg Nebulization BID  . atorvastatin  40 mg Oral QHS  . budesonide (PULMICORT) nebulizer solution  0.5 mg Nebulization BID  . carvedilol  3.125 mg Oral BID WC  . docusate sodium  100 mg Oral BID  . enoxaparin (LOVENOX) injection  40 mg Subcutaneous Q24H  . famotidine  40 mg Oral QHS  . gabapentin  200 mg Oral BID  . ipratropium-albuterol  3 mL  Nebulization TID  . levothyroxine  25 mcg Oral QAC breakfast  . memantine  10 mg Oral BID  . mirabegron ER  25 mg Oral Daily  . polyethylene glycol  17 g Oral Daily  . venlafaxine XR  150 mg Oral Q breakfast    Continuous Infusions: . ampicillin-sulbactam (UNASYN) IV 3 g (09/23/20 1208)    PRN Meds: acetaminophen, albuterol, morphine injection, ondansetron (ZOFRAN) IV, oxyCODONE, Resource ThickenUp Clear  Physical Exam Vitals reviewed.  Constitutional:  General: She is not in acute distress.    Appearance: She is ill-appearing.  Cardiovascular:     Rate and Rhythm: Normal rate.  Pulmonary:     Effort: Pulmonary effort is normal.  Neurological:     Mental Status: She is alert.  Psychiatric:        Cognition and Memory: Cognition is impaired.             Vital Signs: BP 115/69 (BP Location: Right Arm)   Pulse 89   Temp 97.8 F (36.6 C) (Oral)   Resp 20   Ht 5\' 2"  (1.575 m)   Wt 49.8 kg   SpO2 99%   BMI 20.08 kg/m  SpO2: SpO2: 99 % O2 Device: O2 Device: Nasal Cannula O2 Flow Rate: O2 Flow Rate (L/min): 3 L/min (Weaned from 4 to 3 L [place].)  Intake/output summary:   Intake/Output Summary (Last 24 hours) at 09/23/2020 1846 Last data filed at 09/23/2020 1839 Gross per 24 hour  Intake 360 ml  Output 1300 ml  Net -940 ml   LBM: Last BM Date: 09/18/20 Baseline Weight: Weight: 55 kg Most recent weight: Weight: 49.8 kg       Palliative Assessment/Data: PPS 30%      Palliative Care Assessment & Plan   HPI/Patient Profile: 80 y.o. female  with past medical history of COPD, systolic CHF (EF 65-78%), hypothyroidism, dementia, and breast cancer s/p left mastectomy who presented to the emergency department on 09/18/2020 with shortness of breath. At baseline, she wears oxygen 2L at night. Family reporting that patient was getting choked up and coughing while eating and talking. ED Course: Required NRB mask to maintain O2 saturations. WBC 47.1, tachycradia, and tachypnea.  Chest x-ray showed right lung base infiltrate with moderate right-sided pleural effusion.  Admitted to [facility] with sepsis secondary to right empyema and acute hypoxic respiratory failure. Right chest tube placed 4/27.   Assessment: - loculated right pleural effusion - chest pain and dyspnea - COPD with acute exacerbation - chronic systolic CHF - dementia - history of breast cancer  Recommendations/Plan: DNR/DNI as previously documented Continue current medical care Family is cautiously hopeful for improvement If no improvement or in the event of decline, family is open to additional discussion about comfort care Urine culture ordered per family request PMT will continue to follow  Goals of Care and Additional Recommendations: Limitations on Scope of Treatment: Full Scope Treatment  Code Status: DNR/DNI  Prognosis:  Unable to determine  Discharge Planning: To Be Determined   Thank you for allowing the Palliative Medicine Team to assist in the care of this patient.   Total Time 15 minutes Prolonged Time Billed  no       Greater than 50%  of this time was spent counseling and coordinating care related to the above assessment and plan.  Lavena Bullion, NP  Please contact Palliative Medicine Team phone at [phone] for questions and concerns.

## 2020-09-24 ENCOUNTER — Inpatient Hospital Stay (HOSPITAL_COMMUNITY): Payer: Medicare Other

## 2020-09-24 DIAGNOSIS — F0391 Unspecified dementia with behavioral disturbance: Secondary | ICD-10-CM | POA: Diagnosis not present

## 2020-09-24 DIAGNOSIS — R6520 Severe sepsis without septic shock: Secondary | ICD-10-CM

## 2020-09-24 DIAGNOSIS — Z789 Other specified health status: Secondary | ICD-10-CM

## 2020-09-24 DIAGNOSIS — I5032 Chronic diastolic (congestive) heart failure: Secondary | ICD-10-CM | POA: Diagnosis not present

## 2020-09-24 DIAGNOSIS — J9601 Acute respiratory failure with hypoxia: Secondary | ICD-10-CM | POA: Diagnosis not present

## 2020-09-24 DIAGNOSIS — Z66 Do not resuscitate: Secondary | ICD-10-CM

## 2020-09-24 DIAGNOSIS — J90 Pleural effusion, not elsewhere classified: Secondary | ICD-10-CM | POA: Diagnosis not present

## 2020-09-24 DIAGNOSIS — J189 Pneumonia, unspecified organism: Secondary | ICD-10-CM | POA: Diagnosis not present

## 2020-09-24 DIAGNOSIS — A419 Sepsis, unspecified organism: Secondary | ICD-10-CM | POA: Diagnosis not present

## 2020-09-24 DIAGNOSIS — R0600 Dyspnea, unspecified: Secondary | ICD-10-CM

## 2020-09-24 DIAGNOSIS — Z9689 Presence of other specified functional implants: Secondary | ICD-10-CM | POA: Diagnosis not present

## 2020-09-24 LAB — BASIC METABOLIC PANEL
Anion gap: 12 (ref 5–15)
BUN: 31 mg/dL — ABNORMAL HIGH (ref 8–23)
CO2: 27 mmol/L (ref 22–32)
Calcium: 8.6 mg/dL — ABNORMAL LOW (ref 8.9–10.3)
Chloride: 98 mmol/L (ref 98–111)
Creatinine, Ser: 1.36 mg/dL — ABNORMAL HIGH (ref 0.44–1.00)
GFR, Estimated: 40 mL/min — ABNORMAL LOW (ref >60)
Glucose, Bld: 99 mg/dL (ref 70–99)
Potassium: 4.5 mmol/L (ref 3.5–5.1)
Sodium: 137 mmol/L (ref 135–145)

## 2020-09-24 LAB — CBC
HCT: 36.6 % (ref 36.0–46.0)
Hemoglobin: 11.7 g/dL — ABNORMAL LOW (ref 12.0–15.0)
MCH: 29.3 pg (ref 26.0–34.0)
MCHC: 32 g/dL (ref 30.0–36.0)
MCV: 91.5 fL (ref 80.0–100.0)
Platelets: 338 10*3/uL (ref 150–400)
RBC: 4 MIL/uL (ref 3.87–5.11)
RDW: 13.2 % (ref 11.5–15.5)
WBC: 21.6 10*3/uL — ABNORMAL HIGH (ref 4.0–10.5)
nRBC: 0 % (ref 0.0–0.2)

## 2020-09-24 LAB — PATHOLOGIST SMEAR REVIEW

## 2020-09-24 LAB — CULTURE, BODY FLUID W GRAM STAIN -BOTTLE: Culture: NO GROWTH

## 2020-09-24 MED ORDER — SODIUM CHLORIDE 0.9 % IV SOLN
3.0000 g | Freq: Two times a day (BID) | INTRAVENOUS | Status: DC
  Administered 2020-09-25: 3 g via INTRAVENOUS
  Filled 2020-09-24 (×2): qty 8

## 2020-09-24 MED ORDER — SODIUM CHLORIDE 0.45 % IV SOLN: INTRAVENOUS | Status: DC

## 2020-09-24 MED ORDER — ENOXAPARIN SODIUM 30 MG/0.3ML IJ SOSY
30.0000 mg | PREFILLED_SYRINGE | INTRAMUSCULAR | Status: DC
  Administered 2020-09-24: 30 mg via SUBCUTANEOUS
  Filled 2020-09-24: qty 0.3

## 2020-09-24 NOTE — Progress Notes (Signed)
Daily Progress Note   Patient Name: [name]       Date: 09/24/2020 DOB: 10-06-1940  Age: 80 y.o. MRN#: [id number] Attending Physician: Bonnielee Haff, MD Primary Care Physician: [name], MD Admit Date: 09/18/2020  Reason for Consultation/Follow-up: Disposition and Establishing goals of care  Subjective: Chart review performed.  Received updates from primary RN -no acute concerns.  RN reports patient has been medicated for pain several times, has been up to chair, but oral intake remains poor.  Noted albumin to be 2.6 on 09/18/2020.  Went to visit patient at bedside -no family/visitors present.  Patient was lying in bed awake, alert, oriented x2 (to place and self only, disoriented to time and situation), and able to participate in conversation.  Patient is not able to participate in complex medical decision-making. No signs or non-verbal gestures of pain or discomfort noted. No respiratory distress, increased work of breathing, or secretions noted.  Patient denies pain or shortness of breath.  She is on 2 L O2 nasal cannula.  Called son/Brian to provide support, provide updates, and answer any questions he may have.  Provided updates to son that included: PT recommendations, SLP recommendations, lab work review, chest x-ray results and plan for trial of waterseal followed by repeat chest x-rays tomorrow with possible chest tube removal.   Aaron Edelman asked today if there is an option to not continue life-prolonging interventions. We talked about transition to comfort measures in house and what that would entail inclusive of medications to control pain, dyspnea, agitation, nausea, itching, and hiccups. We discussed stopping all unnecessary measures such as blood draws, needle sticks, oxygen,  antibiotics, CBGs/insulin, cardiac monitoring, and frequent vital signs.  Aaron Edelman will consider full comfort care in house, but is not ready to make final decision today.  Therapeutic listening and emotional support provided as Aaron Edelman reflects on past conversations with the patient where she stated clearly she would not want her life prolonged when her dementia has reached the stage it is now (where she is not able to care for herself independently).  Aaron Edelman explains the patient has a very hard time receiving help, even if it is as something as simple as washing her hair.  Aaron Edelman describes the patient and someone who has been very independent her entire life as well as a "lone [place]."  Aaron Edelman clearly understands the patient is at a high risk for rehospitalization an infection due to aspiration pneumonia, which is likely dementia related. Aaron Edelman understands that dementia is a progressive, non-curable disease underlying the patient's current acute medical conditions.  We discussed that inadequate nutritional intake is a poor prognostic indicator and it is anticipated that patient will continue to decline.  Per PT recommendations, patient will require 24-hour supervision/assistance on discharge, recommending home health PT.  Reviewed what is needed for patient to have a positive rehabilitation experience, including adequate nutritional intake. Discussed disposition options to include rehab versus back to [facility] with private duty caregivers (with or without [place]) versus LTC.  Therapeutic listening as Aaron Edelman described patient's previous stay at a rehab facility -he states patient was "miserable" and knows that she would not want to go through that again.  Aaron Edelman is clear that he wishes for the patient to be kept comfortable.  Provided education and counseling at length on the philosophy and benefits of hospice care. Discussed that it offers a holistic approach to care in the setting of end-stage illness and is about  supporting the patient where they are allowing nature to take it's course. Discussed the hospice team includes RNs, physicians, social workers, and chaplains. They can provide personal care, support for the family, and help keep patient out of the hospital. Educated and discussed the difference between residential hospice versus home hospice.  Patient does not yet qualify for residential hospice therefore home hospice would need to be considered. Aaron Edelman states he knows the patient would no longer wish for life prolonging interventions.  He states this is a hard decision for him, but he feels long-term care with hospice would be the patient's choice for herself, therefore the option he feels is most appropriate.  Validation provided that this is a very reasonable option for where the patient is on her dementia path.  Offered reassurance to Aaron Edelman that as long as he kept his mother/her wishes at the center of the decision-making there is no wrong choice.  Briefly discussed what each of these options outlined above would entail financially -Aaron Edelman states the patient has long-term care facility insurance.   Aaron Edelman states that he lives in [place], [place] (ZIP Code [phone]) and at discharge he would like patient transferred to [place] to be admitted under hospice and LTC so he can be close.  Explained that TOC could assist with this transition and he is appreciative.  Thurmond Butts expresses appreciation for PMT updates and discussion today.  All questions and concerns addressed. Encouraged to call with questions and/or concerns. PMT number was previously provided.   Length of Stay: 6  Current Medications: Scheduled Meds:  . anastrozole  1 mg Oral Daily  . arformoterol  15 mcg Nebulization BID  . atorvastatin  40 mg Oral QHS  . budesonide (PULMICORT) nebulizer solution  0.5 mg Nebulization BID  . carvedilol  3.125 mg Oral BID WC  . docusate sodium  100 mg Oral BID  . enoxaparin (LOVENOX) injection  30 mg Subcutaneous  Q24H  . famotidine  40 mg Oral QHS  . gabapentin  200 mg Oral BID  . levothyroxine  25 mcg Oral QAC breakfast  . memantine  10 mg Oral BID  . mirabegron ER  25 mg Oral Daily  . polyethylene glycol  17 g Oral Daily  . venlafaxine XR  150 mg Oral Q breakfast    Continuous Infusions: . sodium chloride 50 mL/hr at 09/24/20 1330  . [START  ON 09/25/2020] ampicillin-sulbactam (UNASYN) IV      PRN Meds: acetaminophen, albuterol, morphine injection, ondansetron (ZOFRAN) IV, oxyCODONE, Resource ThickenUp Clear  Physical Exam Vitals and nursing note reviewed.  Constitutional:      General: She is not in acute distress.    Appearance: She is cachectic. She is ill-appearing.  Pulmonary:     Effort: No respiratory distress.  Skin:    General: Skin is warm and dry.  Neurological:     Mental Status: She is alert. Mental status is at baseline. She is disoriented and confused.     Motor: Weakness present.  Psychiatric:        Attention and Perception: Attention normal.        Behavior: Behavior is cooperative.        Cognition and Memory: Cognition is impaired. Memory is impaired.             Vital Signs: BP 120/64 (BP Location: Right Arm)   Pulse 91   Temp 97.9 F (36.6 C) (Oral)   Resp 18   Ht 5\' 2"  (1.575 m)   Wt 53 kg   SpO2 93%   BMI 21.37 kg/m  SpO2: SpO2: 93 % O2 Device: O2 Device: Nasal Cannula O2 Flow Rate: O2 Flow Rate (L/min): 2 L/min  Intake/output summary:   Intake/Output Summary (Last 24 hours) at 09/24/2020 1538 Last data filed at 09/24/2020 1300 Gross per 24 hour  Intake 500 ml  Output 580 ml  Net -80 ml   LBM: Last BM Date: 09/23/20 Baseline Weight: Weight: 55 kg Most recent weight: Weight: 53 kg       Palliative Assessment/Data: PPS 30 to 40%      Patient Active Problem List   Diagnosis Date Noted  . Pleural effusion   . Empyema lung ([place]) 09/18/2020  . Sepsis with acute respiratory failure ([place]) 09/18/2020  . Thrombocytosis 09/18/2020  .  Leukocytosis 09/18/2020  . Elevated troponin 09/18/2020  . Dementia ([place]) 09/18/2020  . COPD with acute exacerbation ([place]) 01/30/2016  . Cough 01/30/2016  . Breast cancer of lower-inner quadrant of left female breast ([place]) 01/29/2016  . GI bleed 09/26/2015  . Acute GI bleeding 09/26/2015  . Chest tube in place 09/26/2015  . HLD (hyperlipidemia) 09/26/2015  . GI bleeding 09/26/2015  . Acute pulmonary embolism ([place]) 07/13/2013  . Acute renal failure ([place]) 07/08/2013  . Metabolic acidosis 27/07/5007  . Hypokalemia 07/08/2013  . Anemia 07/08/2013  . History of breast cancer 07/08/2013  . CHF (congestive heart failure) ([place]) 07/08/2013  . Diarrhea 07/08/2013  . Renal failure 07/07/2013    Palliative Care Assessment & Plan   Patient Profile: 80 y.o.femalewith past medical history of COPD, systolic CHF (EF 38-18%), hypothyroidism, dementia, and breast cancer s/p left mastectomy who presented to the emergency departmenton 4/26/2022with shortness of breath.At baseline, she wears oxygen 2L at night. Family reporting that patient was getting choked up and coughing while eating and talking. ED Course: Required NRB mask to maintain O2 saturations. WBC 47.1, tachycradia, and tachypnea. Chest x-ray showed right lung base infiltrate with moderate right-sided pleural effusion.  Admitted to [facility] with sepsis secondary to right empyema and acute hypoxic respiratory failure. Right chest tube placed 4/27.  Assessment: loculated right pleural effusion Chest pain and dyspnea COPD with acute exacerbation Chronic systolic CHF Dementia History of breast cancer  Recommendations/Plan: Continue current medical treatment -son is considering transition to full comfort care in house but has made no final decisions Continue DNR/DNI as previously  documented Son's goal is for patient discharge to LTC facility with hospice.  He is requesting placement in [place], [place] to be closer to him (ZIP Code [phone])  -TOC notified and consult placed PMT will continue to follow and support holistically  Goals of Care and Additional Recommendations: Limitations on Scope of Treatment: Full Scope Treatment and No Tracheostomy  Code Status:    Code Status Orders  (From admission, onward)         Start     Ordered   09/19/20 1409  Do not attempt resuscitation (DNR)  Continuous       Question Answer Comment  In the event of cardiac or respiratory ARREST Do not call a "code blue"   In the event of cardiac or respiratory ARREST Do not perform Intubation, CPR, defibrillation or ACLS   In the event of cardiac or respiratory ARREST Use medication by any route, position, wound care, and other measures to relive pain and suffering. May use oxygen, suction and manual treatment of airway obstruction as needed for comfort.      09/19/20 1408        Code Status History    Date Active Date Inactive Code Status Order ID Comments User Context   09/18/2020 1527 09/19/2020 1408 Full Code [id number]  Norval [place], MD ED   02/26/2016 1324 02/27/2016 1446 Full Code [id number]  Coralie Keens, MD Inpatient   09/26/2015 2255 09/27/2015 1841 DNR [id number]  [name], MD ED   07/08/2013 0327 07/14/2013 1942 Full Code [id number]  [name], MD ED   Advance Care Planning Activity      Prognosis: Poor in the setting of advanced age, dementia, poor p.o. intake, and multiple comorbidities  Likely less than 6 months  Discharge Planning: [facility] with Hospice  Care plan was discussed with primary RN, patient's son, Dr. [place] Pink, [facility]  Thank you for allowing the Palliative Medicine Team to assist in the care of this patient.   Total Time  70 minutes Prolonged Time Billed  no       Greater than 50%  of this time was spent counseling and coordinating care related to the above assessment and plan.  Lin Landsman, NP  Please contact Palliative Medicine Team phone at [phone] for  questions and concerns.   *Portions of this note are a verbal dictation therefore any spelling and/or grammatical errors are due to the "[employer]" system interpretation.

## 2020-09-24 NOTE — Progress Notes (Signed)
  Speech Language Pathology Treatment: Dysphagia  Patient Details Name: [name] MRN: [id number] DOB: [birth year] Today's Date: 09/24/2020 Time: 4481-8563 SLP Time Calculation (min) (ACUTE ONLY): 9 min  Assessment / Plan / Recommendation Clinical Impression  Pt was seen briefly for swallowing therapy, as she reported increased pain on her R side around the area of her CT with swallowing. Suspect that this could be attributed to the coughing that occurs with swallowing, both spontaneously and volitionally. Pt had immediate coughing after her first sip, which was fairly large despite cueing from SLP for smaller volumes. No further spontaneous coughing was noted, but pt was cued to volitionally cough or clear her throat to try to clear any possible penetrates. Pt only wanted a small amount of nectar thick liquids this morning; no solids. Would maintain current diet and precautions for now.   HPI HPI: Pt is a 80 yo female presenting with SOB, admitted with sepsis secondary to R empyema with respiratory failure. CXR concerning for R-sided PNA with moderate pleural effusion. Family reported coughing while eating and talking. PMH includes: dementia, COPD, systolic CHF, hypothyroidism, breast ca s/p L mastectomy, recent fall      SLP Plan  Continue with current plan of care       Recommendations  Diet recommendations: Dysphagia 2 (fine chop);Nectar-thick liquid Liquids provided via: Cup;No straw Medication Administration: Crushed with puree Supervision: Patient able to self feed;Full supervision/cueing for compensatory strategies Compensations: Minimize environmental distractions;Slow rate;Small sips/bites;Clear throat intermittently Postural Changes and/or Swallow Maneuvers: Seated upright 90 degrees;Upright 30-60 min after meal                Oral Care Recommendations: Oral care QID Follow up Recommendations: Skilled Nursing facility SLP Visit Diagnosis: Dysphagia, oropharyngeal phase  (R13.12) Plan: Continue with current plan of care       GO                Osie Bond., M.A. [facility] Acute Rehabilitation Services Pager [phone] Office [phone]  09/24/2020, 10:35 AM

## 2020-09-24 NOTE — Progress Notes (Signed)
Physical Therapy Treatment Patient Details Name: [name] MRN: [id number] DOB: [birth year] Today's Date: 09/24/2020    History of Present Illness Pt is a 80 y.o. female who presented 4/26 with SOB, increased choking and coughing with eating and talking, and weight loss. Of note, pt in ED 2 days PTA with a fall and complaints of chest pain. CT chest demonstrated loculated right basilar effusion. S/p R chest tube placed. s/p pleural fibrinolysis 09/23/20. PMH: dementia, osteopenia, hyperthyroidism, hypercholesterolemia, COPD, CHF, cancer, arthritis, and anxiety.    PT Comments    Patient progressing slowly towards PT goals. Continues to be confused asking about if she can go home and find her dog. Requires mod A for bed mobility and standing with use of RW for support. Tolerated gait training today short distances with use of RW and Min A for balance/safety. Sp02 mid 80s on RA, donned 3L/min 02 [place] and able to maintain Sp02 in 90s. Tolerated there ex sitting in chair. Will continue to follow and progress as able.    Follow Up Recommendations  Home health PT;Supervision/Assistance - 24 hour     Equipment Recommendations  Rolling walker with 5" wheels    Recommendations for Other Services       Precautions / Restrictions Precautions Precautions: Fall;Other (comment) Precaution Comments: Rt chest tube, monitor SpO2 Restrictions Weight Bearing Restrictions: No    Mobility  Bed Mobility Overal bed mobility: Needs Assistance Bed Mobility: Rolling;Sidelying to Sit Rolling: Min assist Sidelying to sit: HOB elevated;Mod assist       General bed mobility comments: Assist with LEs and trunk to get to EOB with increased time and cues for technique/use of rail.    Transfers Overall transfer level: Needs assistance Equipment used: Rolling walker (2 wheeled) Transfers: Sit to/from Stand Sit to Stand: Mod assist         General transfer comment: Assist to power to standing with cues  for hand placement/technique. Stood from [employer], transferred to chair post ambulation. Flexed trunk in standing.  Ambulation/Gait Ambulation/Gait assistance: Min assist Gait Distance (Feet): 6 Feet Assistive device: Rolling walker (2 wheeled) Gait Pattern/deviations: Step-through pattern;Decreased stride length;Trunk flexed Gait velocity: decreased   General Gait Details: Slow, mildly unsteady gait with Min A for balance and RW management. Sp02 in mid 80s on RA, no DOE noted. Fatigues.   Stairs             Wheelchair Mobility    Modified Rankin (Stroke Patients Only)       Balance Overall balance assessment: Needs assistance Sitting-balance support: Feet supported;No upper extremity supported Sitting balance-Leahy Scale: Fair Sitting balance - Comments: CLose Min guard for safety.   Standing balance support: During functional activity Standing balance-Leahy Scale: Poor Standing balance comment: Reliant on bil UE support and external assist.                            Cognition Arousal/Alertness: Awake/alert Behavior During Therapy: WFL for tasks assessed/performed Overall Cognitive Status: History of cognitive impairments - at baseline                                 General Comments: Very confused today; had removed 02 upon PT arrival with Sp02 in mid 80s trying to cut her finger nails. Continually asking about her dog and stating she does not know where she lives, "I cannot remember anything today"  Exercises General Exercises - Lower Extremity Ankle Circles/Pumps: AROM;Both;10 reps;Seated Heel Slides: AROM;Both;10 reps;Seated    General Comments General comments (skin integrity, edema, etc.): Sp02 mid 80s on RA upon arrival, donned 3L and able to maintain in 90s.      Pertinent Vitals/Pain Pain Assessment: Faces Faces Pain Scale: Hurts little more Pain Location: back with movement Pain Descriptors / Indicators:  Grimacing;Guarding;Sore Pain Intervention(s): Monitored during session;Repositioned;Limited activity within patient's tolerance    Home Living                      Prior Function            PT Goals (current goals can now be found in the care plan section) Progress towards PT goals: Progressing toward goals (slowly)    Frequency    Min 3X/week      PT Plan Current plan remains appropriate    Co-evaluation              AM-PAC PT "6 Clicks" Mobility   Outcome Measure  Help needed turning from your back to your side while in a flat bed without using bedrails?: A Little Help needed moving from lying on your back to sitting on the side of a flat bed without using bedrails?: A Lot Help needed moving to and from a bed to a chair (including a wheelchair)?: A Lot Help needed standing up from a chair using your arms (e.g., wheelchair or bedside chair)?: A Lot Help needed to walk in hospital room?: A Little Help needed climbing 3-5 steps with a railing? : Total 6 Click Score: 13    End of Session Equipment Utilized During Treatment: Oxygen;Gait belt Activity Tolerance: Patient limited by pain;Patient limited by fatigue Patient left: in chair;with call bell/phone within reach;with chair alarm set Nurse Communication: Mobility status PT Visit Diagnosis: Unsteadiness on feet (R26.81);Other abnormalities of gait and mobility (R26.89);Muscle weakness (generalized) (M62.81);History of falling (Z91.81);Difficulty in walking, not elsewhere classified (R26.2);Pain Pain - part of body:  (back)     Time: 6213-0865 PT Time Calculation (min) (ACUTE ONLY): 20 min  Charges:  $Therapeutic Activity: 8-22 mins                     Marisa Severin, PT, DPT Acute Rehabilitation Services Pager [phone] Office [place] 09/24/2020, 12:41 PM

## 2020-09-24 NOTE — Care Management Important Message (Signed)
Important Message  Patient Details  Name: [name] MRN: [id number] Date of Birth: 08-12-1940   Medicare Important Message Given:  Yes     Shelda Altes 09/24/2020, 11:06 AM

## 2020-09-24 NOTE — Progress Notes (Addendum)
[facility] PROGRESS NOTE   [name] [id number] DOB: 1940-08-06 DOA: 09/18/2020  PCP: [name], MD  Brief History/Interval Summary: 80 y.o. female with medical history significant of COPD,  chronic systolic CHF [id number], hypothyroidism, breast cancer s/p left mastectomy presents from [facility] due to shortness of breath.    Patient with history of dementia and so history was limited.  Supposed to be on home oxygen at 2 L/min.  Presented with shortness of breath.  Was found to have a loculated right-sided pleural effusion.  Hospitalized for further management.    Consultants: Cardiothoracic surgery.  Interventional radiology.  Pulmonology  Procedures: Right-sided chest tube placement  Antibiotics: Anti-infectives (From admission, onward)   Start     Dose/Rate Route Frequency Ordered Stop   09/22/20 1200  Ampicillin-Sulbactam (UNASYN) 3 g in sodium chloride 0.9 % 100 mL IVPB        3 g 200 mL/hr over 30 Minutes Intravenous Every 8 hours 09/22/20 1050     09/19/20 1100  vancomycin (VANCOREADY) IVPB 750 mg/150 mL  Status:  Discontinued        750 mg 150 mL/hr over 60 Minutes Intravenous Every 24 hours 09/18/20 1255 09/21/20 1038   09/18/20 1600  cefTRIAXone (ROCEPHIN) 2 g in sodium chloride 0.9 % 100 mL IVPB  Status:  Discontinued        2 g 200 mL/hr over 30 Minutes Intravenous Every 24 hours 09/18/20 1526 09/22/20 1025   09/18/20 1600  azithromycin (ZITHROMAX) 500 mg in sodium chloride 0.9 % 250 mL IVPB  Status:  Discontinued        500 mg 250 mL/hr over 60 Minutes Intravenous Every 24 hours 09/18/20 1526 09/22/20 1025   09/18/20 0945  vancomycin (VANCOREADY) IVPB 1250 mg/250 mL        1,250 mg 166.7 mL/hr over 90 Minutes Intravenous  Once 09/18/20 0932 09/18/20 1334   09/18/20 0930  ceFEPIme (MAXIPIME) 2 g in sodium chloride 0.9 % 100 mL IVPB        2 g 200 mL/hr over 30 Minutes Intravenous  Once 09/18/20 0926 09/18/20 1043      Subjective/Interval  History: Remains pleasantly confused.  Complains of pain over the chest tube site.     Assessment/Plan:  Loculated right-sided pleural effusion/severe sepsis present on admission/acute respiratory failure with hypoxia Due to loculated effusion cardiothoracic surgery was consulted.  Seen by Dr. Cyndia Bent.  Not thought to be a surgical candidate.  He recommended evaluation by IR.  Discussed with patient's son who was agreeable to procedures to drain this collection.  Patient underwent chest tube placement on 4/27. Gram stain did not show any organisms.  Total WBC in the pleural fluid was 1264.  Cultures are negative so far.  Vancomycin was discontinued.  Patient was continued on ceftriaxone and azithromycin.  Procalcitonin level improved to 0.49.   WBC had been improving and was down to 12.9 but then started rising again.  Could be due to tPA per pulmonology notes.  Noted to be better this morning.  She remains afebrile.   Her antibiotics were narrowed down to Unasyn.  Appreciate pulmonology assistance with management of chest tube.  Dornase/tPA instillation per pulmonology. She has been weaned down to 2 L of oxygen by nasal cannula.  Keep saturations greater than 90%. There was some concern for aspiration.  Patient was seen by speech therapy.  Currently on a dysphagia diet.   Chest pain and dyspnea Symptoms likely due to her pleural  effusion.  No ischemic findings noted on EKG.  Continue to treat symptomatically for now.  She is not a candidate for aggressive work-up.    COPD with acute exacerbation Seems to have improved with steroids and nebulizer treatments.  No wheezing appreciated on examination.  Mildly elevated troponin Likely due to demand ischemia.  EKG without any acute changes.  Normocytic anemia Hemoglobin is stable.  No evidence of overt blood loss.  Chronic systolic CHF Last echocardiogram showed a EF of 40 to 45%.  Basic metabolic panel actually shows rising BUN and creatinine.   Patient has been having poor oral intake for last couple days.  She could actually be somewhat hypovolemic.  We will gently hydrate.  Essential hypertension Continue home medications.  Monitor blood pressures.  Potassium abnormalities Initially hyperkalemic and then subsequently hypokalemic.  Supplement as needed.  History of dementia Patient apparently has significant dementia per family.  Reorient daily.  Delirium precautions.  Hypothyroidism TSH noted to be normal.  Continue levothyroxine.  History of breast cancer Seems to be stable.  Noted to be on anastrozole.  History of GERD Continue Pepcid.  Goals of care Palliative care is following and communicating with family.  As per the notes if patient does not show any improvement in her condition then they may consider transitioning her to comfort care.    DVT Prophylaxis: Lovenox Code Status: DNR Family Communication: Son being updated regularly.   Disposition Plan: Disposition to be determined.  PT and OT recommends home health.  Status is: Inpatient  Remains inpatient appropriate because:IV treatments appropriate due to intensity of illness or inability to take PO and Inpatient level of care appropriate due to severity of illness   Dispo: The patient is from: Home              Anticipated d/c is to: Home              Patient currently is not medically stable to d/c.   Difficult to place patient No       Medications:  Scheduled: . anastrozole  1 mg Oral Daily  . arformoterol  15 mcg Nebulization BID  . atorvastatin  40 mg Oral QHS  . budesonide (PULMICORT) nebulizer solution  0.5 mg Nebulization BID  . carvedilol  3.125 mg Oral BID WC  . docusate sodium  100 mg Oral BID  . enoxaparin (LOVENOX) injection  40 mg Subcutaneous Q24H  . famotidine  40 mg Oral QHS  . gabapentin  200 mg Oral BID  . ipratropium-albuterol  3 mL Nebulization TID  . levothyroxine  25 mcg Oral QAC breakfast  . memantine  10 mg Oral BID   . mirabegron ER  25 mg Oral Daily  . polyethylene glycol  17 g Oral Daily  . venlafaxine XR  150 mg Oral Q breakfast   Continuous: . ampicillin-sulbactam (UNASYN) IV 3 g (09/24/20 0448)   ION:[id number], albuterol, morphine injection, ondansetron (ZOFRAN) IV, oxyCODONE, Resource ThickenUp Clear   Objective:  Vital Signs  Vitals:   09/23/20 1351 09/23/20 2016 09/24/20 0509 09/24/20 0833  BP:  111/62 121/65   Pulse:  88 92   Resp:  19 18   Temp:  98 F (36.7 C) (!) 97.4 F (36.3 C)   TempSrc:  Oral Oral   SpO2: 99% 92% 94% 92%  Weight:   53 kg   Height:        Intake/Output Summary (Last 24 hours) at 09/24/2020 1033 Last data filed at 09/24/2020  0600 Gross per 24 hour  Intake 740 ml  Output 780 ml  Net -40 ml   Filed Weights   09/22/20 0455 09/23/20 0357 09/24/20 0509  Weight: 49.7 kg 49.8 kg 53 kg    General appearance: Awake alert.  In no distress.  Pleasantly confused Resp: Mildly tachypneic with coarse breath sounds and crackles on the right.  No wheezing.  Chest tube noted on the right. Cardio: S1-S2 is normal regular.  No S3-S4.  No rubs murmurs or bruit GI: Abdomen is soft.  Nontender nondistended.  Bowel sounds are present normal.  No masses organomegaly Extremities: No edema.   Neurologic: No focal neurological deficits.      Lab Results:  Data Reviewed: I have personally reviewed following labs and imaging studies  CBC: Recent Labs  Lab 09/19/20 0729 09/20/20 0341 09/21/20 0410 09/22/20 0202 09/23/20 0215 09/24/20 0750  WBC 39.8* 28.3* 12.9* 24.6* 32.6* 21.6*  NEUTROABS 36.5*  --   --   --   --   --   HGB 11.8* 10.8* 11.3* 12.9 13.1 11.7*  HCT 35.8* 32.4* 35.3* 40.1 41.5 36.6  MCV 89.7 90.3 92.2 92.2 92.6 91.5  PLT 400 336 362 375 350 952    Basic Metabolic Panel: Recent Labs  Lab 09/19/20 0729 09/20/20 0341 09/21/20 0410 09/22/20 0202 09/23/20 0215  NA 139 138 138 135 136  K 3.8 3.3* 3.5 4.4 3.7  CL 107 101 102 98 98  CO2 23  26 26 24 27   GLUCOSE 139* 123* 124* 121* 173*  BUN 25* 19 16 13 23   CREATININE 0.75 0.68 0.57 0.66 0.97  CALCIUM 8.7* 8.6* 8.6* 8.3* 8.5*    GFR: Estimated Creatinine Clearance: 37.2 mL/min (by C-G formula based on SCr of 0.97 mg/dL).  Liver Function Tests: Recent Labs  Lab 09/18/20 0945  AST QUANTITY NOT SUFFICIENT, UNABLE TO PERFORM TEST  ALT 6  ALKPHOS 172*  BILITOT 0.1*  PROT 7.2  ALBUMIN 2.6*      Recent Results (from the past 240 hour(s))  Resp Panel by RT-PCR (Flu A&B, Covid) Nasopharyngeal Swab     Status: None   Collection Time: 09/18/20  8:00 AM   Specimen: Nasopharyngeal Swab; Nasopharyngeal(NP) swabs in vial transport medium  Result Value Ref Range Status   SARS Coronavirus 2 by RT PCR NEGATIVE NEGATIVE Final    Comment: (NOTE) SARS-CoV-2 target nucleic acids are NOT DETECTED.  The SARS-CoV-2 RNA is generally detectable in upper respiratory specimens during the acute phase of infection. The lowest concentration of SARS-CoV-2 viral copies this assay can detect is 138 copies/mL. A negative result does not preclude SARS-Cov-2 infection and should not be used as the sole basis for treatment or other patient management decisions. A negative result may occur with  improper specimen collection/handling, submission of specimen other than nasopharyngeal swab, presence of viral mutation(s) within the areas targeted by this assay, and inadequate number of viral copies(<138 copies/mL). A negative result must be combined with clinical observations, patient history, and epidemiological information. The expected result is Negative.  Fact Sheet for Patients:  EntrepreneurPulse.com.au  Fact Sheet for Healthcare Providers:  IncredibleEmployment.be  This test is no t yet approved or cleared by the [place] FDA and  has been authorized for detection and/or diagnosis of SARS-CoV-2 by FDA under an Emergency Use Authorization (EUA).  This EUA will remain  in effect (meaning this test can be used) for the duration of the COVID-19 declaration under Section 564(b)(1) of the Act, 21  U.S.C.section 360bbb-3(b)(1), unless the authorization is terminated  or revoked sooner.       Influenza A by PCR NEGATIVE NEGATIVE Final   Influenza B by PCR NEGATIVE NEGATIVE Final    Comment: (NOTE) The Xpert Xpress SARS-CoV-2/FLU/RSV plus assay is intended as an aid in the diagnosis of influenza from Nasopharyngeal swab specimens and should not be used as a sole basis for treatment. Nasal washings and aspirates are unacceptable for Xpert Xpress SARS-CoV-2/FLU/RSV testing.  Fact Sheet for Patients: EntrepreneurPulse.com.au  Fact Sheet for Healthcare Providers: IncredibleEmployment.be  This test is not yet approved or cleared by the [place] FDA and has been authorized for detection and/or diagnosis of SARS-CoV-2 by FDA under an Emergency Use Authorization (EUA). This EUA will remain in effect (meaning this test can be used) for the duration of the COVID-19 declaration under Section 564(b)(1) of the Act, 21 U.S.C. section 360bbb-3(b)(1), unless the authorization is terminated or revoked.  Performed at [facility], [place] [address] [place], [place] [address]   Blood culture (routine x 2)     Status: None   Collection Time: 09/18/20  9:22 AM   Specimen: BLOOD  Result Value Ref Range Status   Specimen Description BLOOD BLOOD RIGHT FOREARM  Final   Special Requests   Final    BOTTLES DRAWN AEROBIC AND ANAEROBIC Blood Culture results may not be optimal due to an inadequate volume of blood received in culture bottles   Culture   Final    NO GROWTH 5 DAYS Performed at [facility], [place] [address] [place], [place] [address]    Report Status 09/23/2020 FINAL  Final  Blood culture (routine x 2)     Status: None   Collection Time: 09/18/20  9:27 AM   Specimen: BLOOD  Result Value  Ref Range Status   Specimen Description BLOOD RIGHT ANTECUBITAL  Final   Special Requests   Final    BOTTLES DRAWN AEROBIC AND ANAEROBIC Blood Culture results may not be optimal due to an inadequate volume of blood received in culture bottles   Culture   Final    NO GROWTH 5 DAYS Performed at [facility], [place] [address], [place], [place] [address]    Report Status 09/23/2020 FINAL  Final  Culture, body fluid w Gram Stain-bottle     Status: None   Collection Time: 09/19/20  4:46 PM   Specimen: Fluid  Result Value Ref Range Status   Specimen Description FLUID PLEURAL RIGHT  Final   Special Requests BOTTLES DRAWN AEROBIC AND ANAEROBIC  Final   Culture   Final    NO GROWTH 5 DAYS Performed at [facility], [place] [address] [place], [place] [address]    Report Status 09/24/2020 FINAL  Final  Gram stain     Status: None   Collection Time: 09/19/20  4:46 PM   Specimen: Fluid  Result Value Ref Range Status   Specimen Description FLUID PLEURAL RIGHT  Final   Special Requests NONE  Final   Gram Stain   Final    RARE WBC PRESENT, PREDOMINANTLY PMN NO ORGANISMS SEEN Performed at [facility], [place] [address], [place], [place] [address]    Report Status 09/20/2020 FINAL  Final  MRSA PCR Screening     Status: None   Collection Time: 09/20/20  4:30 AM   Specimen: Nasal Mucosa; Nasopharyngeal  Result Value Ref Range Status   MRSA by PCR NEGATIVE NEGATIVE Final  Comment:        The GeneXpert MRSA Assay (FDA approved for NASAL specimens only), is one component of a comprehensive MRSA colonization surveillance program. It is not intended to diagnose MRSA infection nor to guide or monitor treatment for MRSA infections. Performed at [facility], [place] [address], [place], [place] [address]       Radiology Studies: No results found.     LOS: 6 days   Marquette Blodgett [employer] on [email or web address]  09/24/2020, 10:33 AM

## 2020-09-24 NOTE — Plan of Care (Signed)

## 2020-09-24 NOTE — Progress Notes (Addendum)
NAME:  [name], MRN:  [id number], DOB:  November 07, 1940, LOS: 6 ADMISSION DATE:  09/18/2020, CONSULTATION DATE:  09/24/2020 REFERRING MD:  Bonnielee Haff, MD, CHIEF COMPLAINT:  Pleural effusion  History of Present Illness:   80 y.o. woman with history of dementia who present on 4/26 from [place] home by EMS for increased work of breathing and cough.  Family and staff have noted frequent coughing and choking episodes while eating and talking and she has been losing weight unintentionally.  Upon arrival to the ED she was found to be tachycardic, tachypneic, with leukocytosis.  CT chest demonstrated loculated right basilar effusion.  CT surgery was consulted to evaluate this effusion and they recommended IR guided chest tube drainage.  Chest tube was placed by IR on 4/27.  About 600 cc of serous fluid has been drained so far.  Chest x-ray showed persistent loculation on the right base of the lung.  Pulmonary consulted to manage the pleural effusion and chest tube.  Pertinent  Medical History  Breast cancer on anastrazole  Dementia Heart failure reduced ejection fraction COPD Chronic respiratory failure on [facility]  Significant Hospital Events: Including procedures, antibiotic start and stop dates in addition to other pertinent events   . 4/26 admitted for loculated effusion, CTCS consulted, recommend IR guided chest tube placement . 4/27 chest tube placed by IR . 4/28 patient with persistent loculations . 4/29 pulmonary consulted to manage chest tube and effusion . 4/30 chest tube output 670 cc last 24h, s/p tPA/dornase . 5/01 R flank / back pain, WBC elevated on unasyn . 5/02 Pleural fluid, blood cultures negative  Interim History / Subjective:  No acute events overnight  Pt up to chair, chest tube marked at 1128ml   Objective   Blood pressure 120/64, pulse 91, temperature 97.9 F (36.6 C), temperature source Oral, resp. rate 18, height 5\' 2"  (1.575 m), weight 53 kg, SpO2 93  %.    FiO2 (%):  [32 %] 32 %   Intake/Output Summary (Last 24 hours) at 09/24/2020 1123 Last data filed at 09/24/2020 0600 Gross per 24 hour  Intake 740 ml  Output 680 ml  Net 60 ml   Filed Weights   09/22/20 0455 09/23/20 0357 09/24/20 0509  Weight: 49.7 kg 49.8 kg 53 kg    Examination: General: frail elderly female lying in bed in NAD   HEENT: MM pink/moist, fair dentition, [place] O2 Neuro: Awake, sitting in chair, pleasantly confused, oriented to self  CV: s1s2 RRR, no m/r/g PULM: non-labored on [place] O2, lungs bilaterally clear anterior, basilar crackles on R, R chest tube in place, no air leak GI: soft, bsx4 active  Extremities: warm/dry, no edema  Skin: no rashes or lesions   Labs/imaging that I havepersonally reviewed  (right click and "Reselect all SmartList Selections" daily)  Pleural fluid, cultures Blood cultures BMP - new rise in Sr Cr with AKI  CBC - WBC down trending, anemia CXR  - largely drained effusion, small amt fluid remains, CT in good position  CT Chest from admit - large effusion, loculated  Resolved Hospital Problem list     Assessment & Plan:  [name] is a 80 y.o. woman with history of dementia, aspiration admitted with PNA, parapneumonic effusion.    Complex Parapneumonic Effusion due to aspiration pneumonia, likely polymicrobial Likely dementia related aspiration events.  NOS on culture.  S/p tPA, Dornase with ongoing drainage.   -continue unasyn, D7/x.  Would plan for abx to continue 5-7  days after chest tube removed -follow intermittent CXR > largely appears to be drained but CT drainage documented would preclude removal (?accuracy of documented drainage). For 5/2, 139ml drainage -Change CT to water seal, follow up CXR in am.  If no significant fluid accumulation, plan for repeat CXR in afternoon 5/3 and possibly remove chest tube  -hold further tPA / dornase 5/2  -pulmonary hygiene - IS, mobilize -appreciate SLP efforts > cleared for D2 diet,  nectar thick liquids -aspiration precautions  Chronic Hypoxic Respiratory Failure  COPD with Emphysema -continue duoneb TID + pulmicort   AKI  -defer to primary team     Noe Gens, MSN, APRN, NP-C, AGACNP-BC [facility] 09/24/2020, 11:23 AM   Please see [email or web address] for pager details.   From 7A-7P if no response, please call [phone] After hours, please call ELink [phone]

## 2020-09-24 NOTE — Procedures (Signed)
Pleural Fibrinolytic Administration Procedure Note  [name]  [id number]  1941-05-20  Date:09/24/20  Time:12:05 AM   Provider Performing:[name]   Procedure: Pleural Fibrinolysis Subsequent day [phone])  Indication(s) Fibrinolysis of complicated pleural effusion  Consent Risks of the procedure as well as the alternatives and risks of each were explained to the patient and/or caregiver.  Consent for the procedure was obtained.   Anesthesia None   Time Out Verified patient identification, verified procedure, site/side was marked, verified correct patient position, special equipment/implants available, medications/allergies/relevant history reviewed, required imaging and test results available.   Sterile Technique Hand hygiene, gloves   Procedure Description Existing pleural catheter was cleaned and accessed in sterile manner.  10mg  of tPA in 30cc of saline and 5mg  of dornase in 30cc of sterile water were injected into pleural space using existing pleural catheter.  Catheter will be clamped for 1 hour and then placed back to suction.   Complications/Tolerance None; patient tolerated the procedure well.  EBL None   Specimen(s) None   Johnsie Cancel, NP-C [facility] Personal contact information can be found on Amion  09/24/2020, 12:05 AM

## 2020-09-25 ENCOUNTER — Inpatient Hospital Stay (HOSPITAL_COMMUNITY): Payer: Medicare Other

## 2020-09-25 ENCOUNTER — Telehealth: Payer: Self-pay | Admitting: Pulmonary Disease

## 2020-09-25 DIAGNOSIS — J441 Chronic obstructive pulmonary disease with (acute) exacerbation: Secondary | ICD-10-CM | POA: Diagnosis not present

## 2020-09-25 DIAGNOSIS — J90 Pleural effusion, not elsewhere classified: Secondary | ICD-10-CM | POA: Diagnosis not present

## 2020-09-25 DIAGNOSIS — Z4682 Encounter for fitting and adjustment of non-vascular catheter: Secondary | ICD-10-CM

## 2020-09-25 DIAGNOSIS — J189 Pneumonia, unspecified organism: Secondary | ICD-10-CM | POA: Diagnosis not present

## 2020-09-25 DIAGNOSIS — A419 Sepsis, unspecified organism: Secondary | ICD-10-CM | POA: Diagnosis not present

## 2020-09-25 DIAGNOSIS — Z9689 Presence of other specified functional implants: Secondary | ICD-10-CM | POA: Diagnosis not present

## 2020-09-25 DIAGNOSIS — F0391 Unspecified dementia with behavioral disturbance: Secondary | ICD-10-CM | POA: Diagnosis not present

## 2020-09-25 DIAGNOSIS — I5032 Chronic diastolic (congestive) heart failure: Secondary | ICD-10-CM | POA: Diagnosis not present

## 2020-09-25 LAB — CBC
HCT: 32.3 % — ABNORMAL LOW (ref 36.0–46.0)
Hemoglobin: 10.2 g/dL — ABNORMAL LOW (ref 12.0–15.0)
MCH: 29.1 pg (ref 26.0–34.0)
MCHC: 31.6 g/dL (ref 30.0–36.0)
MCV: 92 fL (ref 80.0–100.0)
Platelets: 322 10*3/uL (ref 150–400)
RBC: 3.51 MIL/uL — ABNORMAL LOW (ref 3.87–5.11)
RDW: 13.2 % (ref 11.5–15.5)
WBC: 17.1 10*3/uL — ABNORMAL HIGH (ref 4.0–10.5)
nRBC: 0 % (ref 0.0–0.2)

## 2020-09-25 LAB — BASIC METABOLIC PANEL
Anion gap: 7 (ref 5–15)
BUN: 25 mg/dL — ABNORMAL HIGH (ref 8–23)
CO2: 29 mmol/L (ref 22–32)
Calcium: 8.1 mg/dL — ABNORMAL LOW (ref 8.9–10.3)
Chloride: 99 mmol/L (ref 98–111)
Creatinine, Ser: 1.1 mg/dL — ABNORMAL HIGH (ref 0.44–1.00)
GFR, Estimated: 51 mL/min — ABNORMAL LOW (ref >60)
Glucose, Bld: 87 mg/dL (ref 70–99)
Potassium: 3.9 mmol/L (ref 3.5–5.1)
Sodium: 135 mmol/L (ref 135–145)

## 2020-09-25 LAB — URINE CULTURE
Culture: 10000 — AB
Special Requests: NORMAL

## 2020-09-25 MED ORDER — SACCHAROMYCES BOULARDII 250 MG PO CAPS
250.0000 mg | ORAL_CAPSULE | Freq: Two times a day (BID) | ORAL | Status: DC
  Administered 2020-09-25 – 2020-10-01 (×13): 250 mg via ORAL
  Filled 2020-09-25 (×13): qty 1

## 2020-09-25 MED ORDER — AMOXICILLIN-POT CLAVULANATE 875-125 MG PO TABS: 1.0000 | ORAL_TABLET | Freq: Two times a day (BID) | ORAL | Status: DC

## 2020-09-25 MED ORDER — ENOXAPARIN SODIUM 40 MG/0.4ML IJ SOSY
40.0000 mg | PREFILLED_SYRINGE | INTRAMUSCULAR | Status: DC
  Administered 2020-09-25 – 2020-09-30 (×6): 40 mg via SUBCUTANEOUS
  Filled 2020-09-25 (×6): qty 0.4

## 2020-09-25 MED ORDER — SODIUM CHLORIDE 0.9 % IV SOLN
3.0000 g | Freq: Three times a day (TID) | INTRAVENOUS | Status: DC
  Administered 2020-09-25: 3 g via INTRAVENOUS
  Filled 2020-09-25: qty 8
  Filled 2020-09-25: qty 3

## 2020-09-25 MED ORDER — AMOXICILLIN-POT CLAVULANATE 500-125 MG PO TABS
1.0000 | ORAL_TABLET | Freq: Two times a day (BID) | ORAL | Status: DC
  Administered 2020-09-25 – 2020-10-01 (×13): 500 mg via ORAL
  Filled 2020-09-25 (×14): qty 1

## 2020-09-25 NOTE — Progress Notes (Signed)
   NAME:  [name], MRN:  [id number], DOB:  02-26-1941, LOS: 7 ADMISSION DATE:  09/18/2020, CONSULTATION DATE:  09/25/2020 REFERRING MD:  Bonnielee Haff, MD, CHIEF COMPLAINT:  Pleural effusion  History of Present Illness:   80 y.o. woman with history of dementia who present on 4/26 from [place] home by EMS for increased work of breathing and cough.  Family and staff have noted frequent coughing and choking episodes while eating and talking and she has been losing weight unintentionally.  Upon arrival to the ED she was found to be tachycardic, tachypneic, with leukocytosis.  CT chest demonstrated loculated right basilar effusion.  CT surgery was consulted to evaluate this effusion and they recommended IR guided chest tube drainage.  Chest tube was placed by IR on 4/27.  About 600 cc of serous fluid has been drained so far.  Chest x-ray showed persistent loculation on the right base of the lung.  Pulmonary consulted to manage the pleural effusion and chest tube.  Pertinent  Medical History  Breast cancer on anastrazole  Dementia Heart failure reduced ejection fraction COPD Chronic respiratory failure on [facility]  Significant Hospital Events: Including procedures, antibiotic start and stop dates in addition to other pertinent events   . 4/26 admitted for loculated effusion, CTCS consulted, recommend IR guided chest tube placement . 4/27 chest tube placed by IR . 4/28 patient with persistent loculations . 4/29 pulmonary consulted to manage chest tube and effusion . 4/30 chest tube output 670 cc last 24h, s/p tPA/dornase . 5/01 R flank / back pain, WBC elevated on unasyn . 5/02 Pleural fluid, blood cultures negative . 5/3 dc chest tube  Interim History / Subjective:  No ct drainage  Objective   Blood pressure 111/61, pulse 88, temperature 98.7 F (37.1 C), temperature source Oral, resp. rate 20, height 5\' 2"  (1.575 m), weight 53.2 kg, SpO2 98 %.        Intake/Output Summary  (Last 24 hours) at 09/25/2020 0951 Last data filed at 09/25/2020 0925 Gross per 24 hour  Intake 1113.78 ml  Output 620 ml  Net 493.78 ml   Filed Weights   09/23/20 0357 09/24/20 0509 09/25/20 0415  Weight: 49.8 kg 53 kg 53.2 kg    Examination: General:  Awake and reactive HEENT: MM pink/moist Neuro: poor awareness  CV: hsr PULM:  Decreased bs bases  GI: soft, bsx4 active  Extremities: warm/dry,-  edema  Skin: no rashes or lesions    Labs/imaging that I havepersonally reviewed  (right click and "Reselect all SmartList Selections" daily)  cxr cbc  Resolved Hospital Problem list     Assessment & Plan:  [name] is a 80 y.o. woman with history of dementia, aspiration admitted with PNA, parapneumonic effusion.    Complex Parapneumonic Effusion due to aspiration pneumonia, likely polymicrobial Likely dementia related aspiration events.  NOS on culture.  S/p tPA, Dornase with no drainage.  No further drainage 5/3  5/3 order to dc chest tube written Bed side RN updated CxR for 1400 and am 0600 written Continue abx x 7 days D2 diet Aspiration precautions  s  Chronic Hypoxic Respiratory Failure  COPD with Emphysema Continue current txt   AKI  Lab Results  Component Value Date   CREATININE 1.10 (H) 09/25/2020   CREATININE 1.36 (H) 09/24/2020   CREATININE 0.97 09/23/2020    Per primary   Richardson Landry Judson Tsan ACNP Acute Care Nurse Practitioner [place] Please consult Amion 09/25/2020, 9:53 AM

## 2020-09-25 NOTE — Progress Notes (Addendum)
Pharmacy Antibiotic Note  [name] is a 80 y.o. female admitted on 09/18/2020 with pneumonia.  Pharmacy has been consulted for Unasyn dosing.   ID: PNA, also frequent cough and choking with eating concerning for aspiration events.  - WBC 47 down to 17.1. - no steroids  afeb, LA 1.7, pct 0.5 CT in IR 4/27, Scr improved 1.1 today.  Vancomycin 4/26 >> 4/28 Cefepime 4/26 x1 Ceftriaxone 4/26 >> 4/29  Azithromycin 4/26 >> 4/29 Unasyn 4/30 >>   4/26 BCx: neg 4/26 COVID PCR: neg 4/27 R pleural fluid: ngtd (gram stain neg): neg 4/28 MRSA PCR: neg 5/1: UCx: 10,000 yeast  Plan: Unasyn 3g q8hrs. Pharmacy will sign off. Please reconsult for further dosing assitance.    Height: 5\' 2"  (157.5 cm) Weight: 53.2 kg (117 lb 4.6 oz) IBW/kg (Calculated) : 50.1  Temp (24hrs), Avg:98.1 F (36.7 C), Min:97.9 F (36.6 C), Max:98.7 F (37.1 C)  Recent Labs  Lab 09/18/20 1003 09/19/20 0729 09/21/20 0410 09/22/20 0202 09/23/20 0215 09/24/20 0750 09/25/20 0549  WBC  --    < > 12.9* 24.6* 32.6* 21.6* 17.1*  CREATININE  --    < > 0.57 0.66 0.97 1.36* 1.10*  LATICACIDVEN 1.7  --   --   --   --   --   --    < > = values in this interval not displayed.    Estimated Creatinine Clearance: 32.8 mL/min (A) (by C-G formula based on SCr of 1.1 mg/dL (H)).    No Known Allergies  Haylea Schlichting S. Alford Highland, PharmD, BCPS Clinical Staff Pharmacist [email or web address]  [place] 09/25/2020 8:37 AM

## 2020-09-25 NOTE — Progress Notes (Addendum)
[facility] PROGRESS NOTE   [name] [id number] DOB: 06/16/1940 DOA: 09/18/2020  PCP: [name], MD  Brief History/Interval Summary: 80 y.o. female with medical history significant of COPD,  chronic systolic CHF [id number], hypothyroidism, breast cancer s/p left mastectomy presents from [facility] due to shortness of breath.    Patient with history of dementia and so history was limited.  Supposed to be on home oxygen at 2 L/min.  Presented with shortness of breath.  Was found to have a loculated right-sided pleural effusion.  Hospitalized for further management.    Consultants: Cardiothoracic surgery.  Interventional radiology.  Pulmonology  Procedures: Right-sided chest tube placement  Antibiotics: Anti-infectives (From admission, onward)   Start     Dose/Rate Route Frequency Ordered Stop   09/25/20 0845  Ampicillin-Sulbactam (UNASYN) 3 g in sodium chloride 0.9 % 100 mL IVPB        3 g 200 mL/hr over 30 Minutes Intravenous Every 8 hours 09/25/20 0835     09/25/20 0030  Ampicillin-Sulbactam (UNASYN) 3 g in sodium chloride 0.9 % 100 mL IVPB  Status:  Discontinued        3 g 200 mL/hr over 30 Minutes Intravenous Every 12 hours 09/24/20 1237 09/25/20 0835   09/22/20 1200  Ampicillin-Sulbactam (UNASYN) 3 g in sodium chloride 0.9 % 100 mL IVPB  Status:  Discontinued        3 g 200 mL/hr over 30 Minutes Intravenous Every 8 hours 09/22/20 1050 09/24/20 1237   09/19/20 1100  vancomycin (VANCOREADY) IVPB 750 mg/150 mL  Status:  Discontinued        750 mg 150 mL/hr over 60 Minutes Intravenous Every 24 hours 09/18/20 1255 09/21/20 1038   09/18/20 1600  cefTRIAXone (ROCEPHIN) 2 g in sodium chloride 0.9 % 100 mL IVPB  Status:  Discontinued        2 g 200 mL/hr over 30 Minutes Intravenous Every 24 hours 09/18/20 1526 09/22/20 1025   09/18/20 1600  azithromycin (ZITHROMAX) 500 mg in sodium chloride 0.9 % 250 mL IVPB  Status:  Discontinued        500 mg 250 mL/hr over 60  Minutes Intravenous Every 24 hours 09/18/20 1526 09/22/20 1025   09/18/20 0945  vancomycin (VANCOREADY) IVPB 1250 mg/250 mL        1,250 mg 166.7 mL/hr over 90 Minutes Intravenous  Once 09/18/20 0932 09/18/20 1334   09/18/20 0930  ceFEPIme (MAXIPIME) 2 g in sodium chloride 0.9 % 100 mL IVPB        2 g 200 mL/hr over 30 Minutes Intravenous  Once 09/18/20 0926 09/18/20 1043      Subjective/Interval History: Patient remains pleasantly confused.  Does not appear to be in any discomfort.     Assessment/Plan:  Loculated right-sided pleural effusion/severe sepsis present on admission/acute respiratory failure with hypoxia Due to loculated effusion cardiothoracic surgery was consulted.  Seen by Dr. Cyndia Bent.  Not thought to be a surgical candidate.  He recommended evaluation by IR.  Discussed with patient's son who was agreeable to procedures to drain this collection.  Patient underwent chest tube placement on 4/27. Gram stain did not show any organisms.  Total WBC in the pleural fluid was 1264.  Cultures negative so far.   Patient's antibiotic regimen was changed.  Vancomycin was discontinued.  Changed over to Unasyn from ceftriaxone and azithromycin.  Procalcitonin level improved to 0.49.   WBC had been improving and was down to 12.9 but then started rising  again.  Could be due to tPA per pulmonology notes.  To be improving again.  Remains afebrile.   Appreciate pulmonology assistance with management of chest tube.  She underwent dornase/tPA instillation per pulmonology.  Significant improvement in the pleural effusion noted on serial chest x-rays.  Chest tube to be discontinued this morning. Discussed with nursing staff and they will continue to wean her down on her oxygen requirements as much as possible.  She may need home oxygen. There was some concern for aspiration.  Patient was seen by speech therapy.  Currently on a dysphagia diet.  We will change her to Augmentin to complete on  10/02/20.  Chest pain and dyspnea Symptoms likely due to her pleural effusion.  No ischemic findings noted on EKG.  Continue to treat symptomatically for now.  She is not a candidate for aggressive work-up.    Acute kidney injury She had a significant rise in creatinine from 0.97-1.36 with rising BUN as well.  This was most likely due to poor oral intake.  Gently hydrated and has improved today.  We will recheck labs tomorrow.  Discontinue IV fluids.  COPD with acute exacerbation Seems to have improved with steroids and nebulizer treatments.  No wheezing appreciated on examination.  Mildly elevated troponin Likely due to demand ischemia.  EKG without any acute changes.  Normocytic anemia Hemoglobin is stable.  No evidence of overt blood loss.  Chronic systolic CHF Last echocardiogram showed a EF of 40 to 45%.  Stable for the most part.  Essential hypertension Continue home medications.  Monitor blood pressures.  Potassium abnormalities Initially hyperkalemic and then subsequently hypokalemic.  Supplement as needed.  History of dementia Patient apparently has significant dementia per family.  Reorient daily.  Delirium precautions.  Hypothyroidism TSH noted to be normal.  Continue levothyroxine.  History of breast cancer Seems to be stable.  Noted to be on anastrozole.  History of GERD Continue Pepcid.  Goals of care Palliative care is following and communicating with family.  At this time plan is for patient to go to long-term care with hospice.  Patient's son lives in [place] and would prefer for the patient to be close to him.  TOC is following.    DVT Prophylaxis: Lovenox Code Status: DNR Family Communication: Son being updated regularly.   Disposition Plan: Disposition to be determined.  PT and OT recommends home health.  Will likely need home oxygen as well.  Status is: Inpatient  Remains inpatient appropriate because:IV treatments appropriate due to intensity of  illness or inability to take PO and Inpatient level of care appropriate due to severity of illness   Dispo: The patient is from: Home              Anticipated d/c is to: Home              Patient currently is not medically stable to d/c.   Difficult to place patient No       Medications:  Scheduled: . anastrozole  1 mg Oral Daily  . arformoterol  15 mcg Nebulization BID  . atorvastatin  40 mg Oral QHS  . budesonide (PULMICORT) nebulizer solution  0.5 mg Nebulization BID  . carvedilol  3.125 mg Oral BID WC  . docusate sodium  100 mg Oral BID  . enoxaparin (LOVENOX) injection  40 mg Subcutaneous Q24H  . famotidine  40 mg Oral QHS  . gabapentin  200 mg Oral BID  . levothyroxine  25 mcg Oral QAC  breakfast  . memantine  10 mg Oral BID  . mirabegron ER  25 mg Oral Daily  . polyethylene glycol  17 g Oral Daily  . venlafaxine XR  150 mg Oral Q breakfast   Continuous: . sodium chloride 50 mL/hr at 09/25/20 0733  . ampicillin-sulbactam (UNASYN) IV 3 g (09/25/20 0853)   [id number], albuterol, morphine injection, ondansetron (ZOFRAN) IV, oxyCODONE, Resource ThickenUp Clear   Objective:  Vital Signs  Vitals:   09/24/20 2021 09/25/20 0415 09/25/20 0814 09/25/20 1158  BP:  111/61  111/61  Pulse:  88  78  Resp:  20  16  Temp:  98.7 F (37.1 C)  98.6 F (37 C)  TempSrc:  Oral  Oral  SpO2: 94% 97% 98% 99%  Weight:  53.2 kg    Height:        Intake/Output Summary (Last 24 hours) at 09/25/2020 1252 Last data filed at 09/25/2020 0925 Gross per 24 hour  Intake 1113.78 ml  Output 620 ml  Net 493.78 ml   Filed Weights   09/23/20 0357 09/24/20 0509 09/25/20 0415  Weight: 49.8 kg 53 kg 53.2 kg    General appearance: Awake alert.  In no distress.  Pleasantly confused Resp: Normal effort noted today.  Improved air entry right lower lung. Cardio: S1-S2 is normal regular.  No S3-S4.  No rubs murmurs or bruit GI: Abdomen is soft.  Nontender nondistended.  Bowel sounds are  present normal.  No masses organomegaly Extremities: No edema.   Neurologic:   No focal neurological deficits.     Lab Results:  Data Reviewed: I have personally reviewed following labs and imaging studies  CBC: Recent Labs  Lab 09/19/20 0729 09/20/20 0341 09/21/20 0410 09/22/20 0202 09/23/20 0215 09/24/20 0750 09/25/20 0549  WBC 39.8*   < > 12.9* 24.6* 32.6* 21.6* 17.1*  NEUTROABS 36.5*  --   --   --   --   --   --   HGB 11.8*   < > 11.3* 12.9 13.1 11.7* 10.2*  HCT 35.8*   < > 35.3* 40.1 41.5 36.6 32.3*  MCV 89.7   < > 92.2 92.2 92.6 91.5 92.0  PLT 400   < > 362 375 350 338 322   < > = values in this interval not displayed.    Basic Metabolic Panel: Recent Labs  Lab 09/21/20 0410 09/22/20 0202 09/23/20 0215 09/24/20 0750 09/25/20 0549  NA 138 135 136 137 135  K 3.5 4.4 3.7 4.5 3.9  CL 102 98 98 98 99  CO2 26 24 27 27 29   GLUCOSE 124* 121* 173* 99 87  BUN 16 13 23  31* 25*  CREATININE 0.57 0.66 0.97 1.36* 1.10*  CALCIUM 8.6* 8.3* 8.5* 8.6* 8.1*    GFR: Estimated Creatinine Clearance: 32.8 mL/min (A) (by C-G formula based on SCr of 1.1 mg/dL (H)).     Recent Results (from the past 240 hour(s))  Resp Panel by RT-PCR (Flu A&B, Covid) Nasopharyngeal Swab     Status: None   Collection Time: 09/18/20  8:00 AM   Specimen: Nasopharyngeal Swab; Nasopharyngeal(NP) swabs in vial transport medium  Result Value Ref Range Status   SARS Coronavirus 2 by RT PCR NEGATIVE NEGATIVE Final    Comment: (NOTE) SARS-CoV-2 target nucleic acids are NOT DETECTED.  The SARS-CoV-2 RNA is generally detectable in upper respiratory specimens during the acute phase of infection. The lowest concentration of SARS-CoV-2 viral copies this assay can detect is 138 copies/mL. A negative  result does not preclude SARS-Cov-2 infection and should not be used as the sole basis for treatment or other patient management decisions. A negative result may occur with  improper specimen  collection/handling, submission of specimen other than nasopharyngeal swab, presence of viral mutation(s) within the areas targeted by this assay, and inadequate number of viral copies(<138 copies/mL). A negative result must be combined with clinical observations, patient history, and epidemiological information. The expected result is Negative.  Fact Sheet for Patients:  EntrepreneurPulse.com.au  Fact Sheet for Healthcare Providers:  IncredibleEmployment.be  This test is no t yet approved or cleared by the [place] FDA and  has been authorized for detection and/or diagnosis of SARS-CoV-2 by FDA under an Emergency Use Authorization (EUA). This EUA will remain  in effect (meaning this test can be used) for the duration of the COVID-19 declaration under Section 564(b)(1) of the Act, 21 U.S.C.section 360bbb-3(b)(1), unless the authorization is terminated  or revoked sooner.       Influenza A by PCR NEGATIVE NEGATIVE Final   Influenza B by PCR NEGATIVE NEGATIVE Final    Comment: (NOTE) The Xpert Xpress SARS-CoV-2/FLU/RSV plus assay is intended as an aid in the diagnosis of influenza from Nasopharyngeal swab specimens and should not be used as a sole basis for treatment. Nasal washings and aspirates are unacceptable for Xpert Xpress SARS-CoV-2/FLU/RSV testing.  Fact Sheet for Patients: EntrepreneurPulse.com.au  Fact Sheet for Healthcare Providers: IncredibleEmployment.be  This test is not yet approved or cleared by the [place] FDA and has been authorized for detection and/or diagnosis of SARS-CoV-2 by FDA under an Emergency Use Authorization (EUA). This EUA will remain in effect (meaning this test can be used) for the duration of the COVID-19 declaration under Section 564(b)(1) of the Act, 21 U.S.C. section 360bbb-3(b)(1), unless the authorization is terminated or revoked.  Performed at [facility], [place] [address], [place], [place] [address]   Blood culture (routine x 2)     Status: None   Collection Time: 09/18/20  9:22 AM   Specimen: BLOOD  Result Value Ref Range Status   Specimen Description BLOOD BLOOD RIGHT FOREARM  Final   Special Requests   Final    BOTTLES DRAWN AEROBIC AND ANAEROBIC Blood Culture results may not be optimal due to an inadequate volume of blood received in culture bottles   Culture   Final    NO GROWTH 5 DAYS Performed at [facility], [place] [address] [place], [place] [address]    Report Status 09/23/2020 FINAL  Final  Blood culture (routine x 2)     Status: None   Collection Time: 09/18/20  9:27 AM   Specimen: BLOOD  Result Value Ref Range Status   Specimen Description BLOOD RIGHT ANTECUBITAL  Final   Special Requests   Final    BOTTLES DRAWN AEROBIC AND ANAEROBIC Blood Culture results may not be optimal due to an inadequate volume of blood received in culture bottles   Culture   Final    NO GROWTH 5 DAYS Performed at [facility], [place] [address], [place],  [address]    Report Status 09/23/2020 FINAL  Final  Culture, body fluid w Gram Stain-bottle     Status: None   Collection Time: 09/19/20  4:46 PM   Specimen: Fluid  Result Value Ref Range Status   Specimen Description FLUID PLEURAL RIGHT  Final   Special Requests BOTTLES DRAWN AEROBIC AND ANAEROBIC  Final   Culture  Final    NO GROWTH 5 DAYS Performed at [facility], [place] [address], [place], [place] [address]    Report Status 09/24/2020 FINAL  Final  Gram stain     Status: None   Collection Time: 09/19/20  4:46 PM   Specimen: Fluid  Result Value Ref Range Status   Specimen Description FLUID PLEURAL RIGHT  Final   Special Requests NONE  Final   Gram Stain   Final    RARE WBC PRESENT, PREDOMINANTLY PMN NO ORGANISMS SEEN Performed at [facility], [place] [address] [place], [place] [address]    Report Status 09/20/2020 FINAL  Final  MRSA PCR  Screening     Status: None   Collection Time: 09/20/20  4:30 AM   Specimen: Nasal Mucosa; Nasopharyngeal  Result Value Ref Range Status   MRSA by PCR NEGATIVE NEGATIVE Final    Comment:        The GeneXpert MRSA Assay (FDA approved for NASAL specimens only), is one component of a comprehensive MRSA colonization surveillance program. It is not intended to diagnose MRSA infection nor to guide or monitor treatment for MRSA infections. Performed at [facility], [place] [address] [place], [place] [address]   Urine Culture     Status: Abnormal   Collection Time: 09/23/20  6:56 PM   Specimen: Urine, Random  Result Value Ref Range Status   Specimen Description URINE, RANDOM  Final   Special Requests   Final    Unasyn Normal Performed at [facility], [place] [address] [place], [place] [address]    Culture 10,000 COLONIES/mL YEAST (A)  Final   Report Status 09/25/2020 FINAL  Final      Radiology Studies: DG Chest 2 View  Result Date: 09/24/2020 CLINICAL DATA:  Recent pleural effusion. History of breast carcinoma EXAM: CHEST - 2 VIEW COMPARISON:  September 21, 2020. FINDINGS: PleurX catheter appears more peripherally position in the inferior lateral right base region compared to recent study. No pneumothorax. The previously noted apparent loculated effusion on the right has largely resolved. A small amount of residual pleural effusion on the right with right base atelectasis is noted. There is a stable calcified granuloma in the medial right upper lobe. Lungs elsewhere are clear. Heart size and pulmonary vascularity are normal. No adenopathy. No bone lesions. IMPRESSION: PleurX catheter as described. Right pleural effusion much smaller with small residual right pleural effusion and right base atelectasis. No pneumothorax. Stable calcified granuloma medial right upper lobe. Lungs elsewhere clear. Heart size normal. Electronically Signed   By: Lowella Grip III M.D.   On: 09/24/2020  11:17   DG CHEST PORT 1 VIEW  Result Date: 09/25/2020 CLINICAL DATA:  Pleural effusion.  Chest tube. EXAM: PORTABLE CHEST 1 VIEW COMPARISON:  09/24/2020. FINDINGS: Right chest tube in stable position. No pneumothorax. Mild right base atelectasis again noted. Stable calcification left upper lung most consistent granuloma. Tiny right pleural effusion cannot be excluded. Heart size normal. Mild right chest wall subcutaneous emphysema. IMPRESSION: Right chest tube in stable position. No pneumothorax. Mild right base atelectasis again noted. Tiny right pleural effusion cannot be excluded. Electronically Signed   By: Marcello Moores  Register   On: 09/25/2020 05:45       LOS: 7 days   [facility] Pager on [email or web address]  09/25/2020, 12:52 PM

## 2020-09-25 NOTE — Progress Notes (Signed)
Occupational Therapy Treatment Patient Details Name: [name] MRN: [id number] DOB: 07-12-1940 Today's Date: 09/25/2020    History of present illness Pt is a 80 y.o. female who presented 09/18/20 with SOB, choking/coughing with eating and talking, weight loss. Of note, pt in ED 2 days PTA with a fall and c/o chest pain. CT chest demonstrated loculated right basilar effusion. S/p R chest tube placement 4/27. PMH includes dementia, osteopenia, hyperthyroidism, COPD, CHF, cancer, arthritis, anxiety.   OT comments  Pt making progress with functional goals. Session focused on functional mobility with RW, toilet transfers, toileting and dressing tasks. Pt with impaired cognition, balance, endurance and strength. Pt requires mutlimodal cues for task intiation and continuation/carryover. OT will continue to follow acutely to maximize level of function and safety  Follow Up Recommendations  Home health OT;Supervision/Assistance - 24 hour;Other (comment) (back to ALF)    Equipment Recommendations  None recommended by OT    Recommendations for Other Services      Precautions / Restrictions Precautions Precautions: Fall;Other (comment) Precaution Comments: R-side chest tube (to d/c 5/3), monitor SpO2 Restrictions Weight Bearing Restrictions: No       Mobility Bed Mobility               General bed mobility comments: Received sitting in recliner    Transfers Overall transfer level: Needs assistance Equipment used: 4-wheeled walker Transfers: Sit to/from Stand Sit to Stand: Min assist;Min guard         General transfer comment: cues for safety to lock brakes    Balance Overall balance assessment: Needs assistance Sitting-balance support: Feet supported;No upper extremity supported Sitting balance-Leahy Scale: Fair     Standing balance support: Bilateral upper extremity supported;During functional activity Standing balance-Leahy Scale: Poor Standing balance comment:  Reliant on UE support                           ADL either performed or assessed with clinical judgement   ADL Overall ADL's : Needs assistance/impaired     Grooming: Wash/dry hands;Wash/dry face;Standing;Cueing for safety;Cueing for sequencing;Min guard           Upper Body Dressing : Minimal assistance;Sitting;Cueing for sequencing   Lower Body Dressing: Minimal assistance Lower Body Dressing Details (indicate cue type and reason): donning socks seated in recliner, mod verbal cues to initiate and for carryover Toilet Transfer: Minimal assistance;Min guard;Ambulation;RW   Toileting- Clothing Manipulation and Hygiene: Minimal assistance;Sit to/from stand Toileting - Clothing Manipulation Details (indicate cue type and reason): min verbal cues to intiate clothing mgt     Functional mobility during ADLs: Minimal assistance;Min guard;Rolling walker;Cueing for safety;Cueing for sequencing General ADL Comments: pt required multimodal cues to initiate ADL tasks and for task continuation     Vision Patient Visual Report: No change from baseline     Perception     Praxis      Cognition Arousal/Alertness: Awake/alert Behavior During Therapy: WFL for tasks assessed/performed Overall Cognitive Status: History of cognitive impairments - at baseline Area of Impairment: Orientation;Attention;Memory;Following commands;Safety/judgement;Awareness;Problem solving                 Orientation Level: Disoriented to;Place;Time;Situation Current Attention Level: Focused;Sustained Memory: Decreased short-term memory Following Commands: Follows one step commands consistently Safety/Judgement: Decreased awareness of safety;Decreased awareness of deficits Awareness: Intellectual Problem Solving: Slow processing;Decreased initiation;Difficulty sequencing;Requires verbal cues General Comments: Pt repeatedly asking same questions, apparent decreased attention and poor short-term  memory; following simple commands appropriately, but suspect  pt forgets current task she was asked to complete then moves onto something else (i.e. will stand up when asked, but quick to return to sitting after a few seconds)        Exercises Other Exercises Other Exercises: Repeated sit<>stands, marching in place   Shoulder Instructions       General Comments SpO2 99% on 3L O2 upon arrival, maintaining 88-93% on RA; HR 80s-90s    Pertinent Vitals/ Pain       Pain Assessment: Faces Faces Pain Scale: No hurt Pain Intervention(s): Monitored during session;Repositioned  Home Living                                          Prior Functioning/Environment              Frequency  Min 2X/week        Progress Toward Goals  OT Goals(current goals can now be found in the care plan section)  Progress towards OT goals: Progressing toward goals     Plan Discharge plan remains appropriate    Co-evaluation                 AM-PAC OT "6 Clicks" Daily Activity     Outcome Measure   Help from another person eating meals?: None Help from another person taking care of personal grooming?: A Little Help from another person toileting, which includes using toliet, bedpan, or urinal?: A Lot Help from another person bathing (including washing, rinsing, drying)?: A Lot Help from another person to put on and taking off regular upper body clothing?: A Little Help from another person to put on and taking off regular lower body clothing?: A Lot 6 Click Score: 16    End of Session Equipment Utilized During Treatment: Gait belt;Rolling walker  OT Visit Diagnosis: Unsteadiness on feet (R26.81);Pain;Other symptoms and signs involving cognitive function;History of falling (Z91.81)   Activity Tolerance Patient tolerated treatment well   Patient Left in bed;with call bell/phone within reach;with chair alarm set   Nurse Communication          Time: 6195-0932 OT  Time Calculation (min): 20 min  Charges: OT General Charges $OT Visit: 1 Visit OT Treatments $Self Care/Home Management : 8-22 mins     Britt Bottom 09/25/2020, 12:14 PM

## 2020-09-25 NOTE — NC FL2 (Signed)
[place] LEVEL OF CARE SCREENING TOOL     IDENTIFICATION  Patient Name: [name] Birthdate: 04/28/1941 Sex: female Admission Date (Current Location): 09/18/2020  [facility] and [place] Number:  Herbalist and Address:  [facility] [place], [place] [address], [place], [place] [address]      Provider Number: [id number]  Attending Physician Name and Address:  Bonnielee Haff, MD  Relative Name and Phone Number:  Zyanne, Schumm)   [phone]    Current Level of Care: Other (Comment) ([facility]) Recommended Level of Care: Assisted Living Facility Prior Approval Number:    Date Approved/Denied:   PASRR Number:    Discharge Plan: Other (Comment) (ALF ([facility]))    Current Diagnoses: Patient Active Problem List   Diagnosis Date Noted  . Pleural effusion   . Empyema lung ([place]) 09/18/2020  . Sepsis with acute respiratory failure ([place]) 09/18/2020  . Thrombocytosis 09/18/2020  . Leukocytosis 09/18/2020  . Elevated troponin 09/18/2020  . Dementia ([place]) 09/18/2020  . COPD with acute exacerbation ([place]) 01/30/2016  . Cough 01/30/2016  . Breast cancer of lower-inner quadrant of left female breast ([place]) 01/29/2016  . GI bleed 09/26/2015  . Acute GI bleeding 09/26/2015  . Chest tube in place 09/26/2015  . HLD (hyperlipidemia) 09/26/2015  . GI bleeding 09/26/2015  . Acute pulmonary embolism ([place]) 07/13/2013  . Acute renal failure ([place]) 07/08/2013  . Metabolic acidosis 78/46/9629  . Hypokalemia 07/08/2013  . Anemia 07/08/2013  . History of breast cancer 07/08/2013  . CHF (congestive heart failure) ([place]) 07/08/2013  . Diarrhea 07/08/2013  . Renal failure 07/07/2013    Orientation RESPIRATION BLADDER Height & Weight     Self  Normal External catheter Weight: 117 lb 4.6 oz (53.2 kg) Height:  5\' 2"  (157.5 cm)  BEHAVIORAL SYMPTOMS/MOOD NEUROLOGICAL BOWEL NUTRITION STATUS      Continent Diet (See DC Summary)  AMBULATORY  STATUS COMMUNICATION OF NEEDS Skin   Limited Assist Verbally                         Personal Care Assistance Level of Assistance  Bathing,Feeding,Dressing Bathing Assistance: Limited assistance Feeding assistance: Independent Dressing Assistance: Limited assistance     Functional Limitations Info  Sight,Hearing,Speech Sight Info: Adequate Hearing Info: Adequate Speech Info: Adequate    SPECIAL CARE FACTORS FREQUENCY  PT (By licensed PT),OT (By licensed OT)     PT Frequency: 3x a week OT Frequency: 3x a week            Contractures Contractures Info: Not present    Additional Factors Info  Code Status,Allergies Code Status Info: DNR Allergies Info: NKA           Current Medications (09/25/2020):  This is the current hospital active medication list Current Facility-Administered Medications  Medication Dose Route Frequency Provider Last Rate Last Admin  . acetaminophen (TYLENOL) tablet 650 mg  650 mg Oral Q6H PRN Bonnielee Haff, MD   650 mg at 09/20/20 1606  . albuterol (PROVENTIL) (2.5 MG/3ML) 0.083% nebulizer solution 2.5 mg  2.5 mg Nebulization Q2H PRN Fuller Plan A, MD   2.5 mg at 09/19/20 0707  . amoxicillin-clavulanate (AUGMENTIN) 500-125 MG per tablet 500 mg  1 tablet Oral Q12H Karren Cobble, RPH      . anastrozole (ARIMIDEX) tablet 1 mg  1 mg Oral Daily Tamala Julian, Rondell A, MD   1 mg at 09/25/20 0853  . arformoterol (BROVANA) nebulizer  solution 15 mcg  15 mcg Nebulization BID Fuller Plan A, MD   15 mcg at 09/25/20 0813  . atorvastatin (LIPITOR) tablet 40 mg  40 mg Oral QHS Fuller Plan A, MD   40 mg at 09/24/20 2132  . budesonide (PULMICORT) nebulizer solution 0.5 mg  0.5 mg Nebulization BID Fuller Plan A, MD   0.5 mg at 09/25/20 0813  . carvedilol (COREG) tablet 3.125 mg  3.125 mg Oral BID WC Smith, Rondell A, MD   3.125 mg at 09/25/20 0854  . docusate sodium (COLACE) capsule 100 mg  100 mg Oral BID Bonnielee Haff, MD   100 mg at 09/25/20  0854  . enoxaparin (LOVENOX) injection 40 mg  40 mg Subcutaneous Q24H Karren Cobble, [place]      . famotidine (PEPCID) tablet 40 mg  40 mg Oral QHS Fuller Plan A, MD   40 mg at 09/24/20 2132  . gabapentin (NEURONTIN) capsule 200 mg  200 mg Oral BID Fuller Plan A, MD   200 mg at 09/25/20 0854  . levothyroxine (SYNTHROID) tablet 25 mcg  25 mcg Oral QAC breakfast Fuller Plan A, MD   25 mcg at 09/25/20 0516  . memantine (NAMENDA) tablet 10 mg  10 mg Oral BID Fuller Plan A, MD   10 mg at 09/25/20 0854  . mirabegron ER (MYRBETRIQ) tablet 25 mg  25 mg Oral Daily Fuller Plan A, MD   25 mg at 09/25/20 0854  . ondansetron (ZOFRAN) injection 4 mg  4 mg Intravenous Q6H PRN Bonnielee Haff, MD      . oxyCODONE (Oxy IR/ROXICODONE) immediate release tablet 5 mg  5 mg Oral Q4H PRN Bonnielee Haff, MD   5 mg at 09/23/20 1216  . polyethylene glycol (MIRALAX / GLYCOLAX) packet 17 g  17 g Oral Daily Bonnielee Haff, MD   17 g at 09/25/20 0853  . Resource ThickenUp Clear   Oral PRN Bonnielee Haff, MD      . saccharomyces boulardii (FLORASTOR) capsule 250 mg  250 mg Oral BID Bonnielee Haff, MD      . venlafaxine XR [facility]) 24 hr capsule 150 mg  150 mg Oral Q breakfast Fuller Plan A, MD   150 mg at 09/25/20 1308     Discharge Medications: Please see discharge summary for a list of discharge medications.  Relevant Imaging Results:  Relevant Lab Results:   Additional Information SSN# [id number]  Reece Agar, [place]

## 2020-09-25 NOTE — TOC Initial Note (Addendum)
Transition of Care [facility]) - Initial/Assessment Note    Patient Details  Name: [name] MRN: [id number] Date of Birth: [birth year]  [facility] CM/SW Contact:    Tresa Endo Phone Number: 09/25/2020, 1:22 PM  Clinical Narrative:                  1:20pm- CSW spoke with [facility] for follow up on pt return to facility with [facility] PT, facility stated they will look over pt FL2 and contact CSW to see if they are able to manage patient's ordered pt.  Palliative spoke with pt son, he wants pt to go to [place] with him. Disregard CSW previous note. CSW will follow up.  3:30 pm- Csw spoke with pt son about LTF here in [place], he is needing help with getting placement for pt. He says he does not have family support to help him here but his daughter sometimes to visits her grandmother. CSW explained that we could fax her out to SNF long term and see if any of those facilities are willing to take pt. Pt son explained that his mother has LTCF insurance but CSW made him aware that insurance will reimburse and will not get paid up front. CSW is pending offers and will follow up with son on any offers.       Patient Goals and CMS Choice        Expected Discharge Plan and Services                                                Prior Living Arrangements/Services                       Activities of Daily Living      Permission Sought/Granted                  Emotional Assessment              Admission diagnosis:  Shortness of breath [R06.02] Hyperkalemia [E87.5] Pneumonia [J18.9] COPD exacerbation ([place]) [J44.1] Community acquired pneumonia of right lower lobe of lung [J18.9] Patient Active Problem List   Diagnosis Date Noted  . Pleural effusion   . Empyema lung ([place]) 09/18/2020  . Sepsis with acute respiratory failure ([place]) 09/18/2020  . Thrombocytosis 09/18/2020  . Leukocytosis 09/18/2020  . Elevated troponin 09/18/2020  .  Dementia ([place]) 09/18/2020  . COPD with acute exacerbation ([place]) 01/30/2016  . Cough 01/30/2016  . Breast cancer of lower-inner quadrant of left female breast ([place]) 01/29/2016  . GI bleed 09/26/2015  . Acute GI bleeding 09/26/2015  . Chest tube in place 09/26/2015  . HLD (hyperlipidemia) 09/26/2015  . GI bleeding 09/26/2015  . Acute pulmonary embolism ([place]) 07/13/2013  . Acute renal failure ([place]) 07/08/2013  . Metabolic acidosis 41/28/7867  . Hypokalemia 07/08/2013  . Anemia 07/08/2013  . History of breast cancer 07/08/2013  . CHF (congestive heart failure) ([place]) 07/08/2013  . Diarrhea 07/08/2013  . Renal failure 07/07/2013   PCP:  [name], MD Pharmacy:  No Pharmacies Listed    Social Determinants of Health (SDOH) Interventions    Readmission Risk Interventions No flowsheet data found.

## 2020-09-25 NOTE — Telephone Encounter (Signed)
Please schedule patient for follow up with me or the nurse practitioners in 3-4 weeks with a chest radiograph to monitor a right pleural effusion. Let me know the date and time so I can add it to her discharge paper work.  Thanks, Wille Glaser

## 2020-09-25 NOTE — Progress Notes (Signed)
Physical Therapy Treatment Patient Details Name: [name] MRN: [id number] DOB: February 09, 1941 Today's Date: 09/25/2020    History of Present Illness Pt is a 80 y.o. female who presented 09/18/20 with SOB, choking/coughing with eating and talking, weight loss. Of note, pt in ED 2 days PTA with a fall and c/o chest pain. CT chest demonstrated loculated right basilar effusion. S/p R chest tube placement 4/27. PMH includes dementia, osteopenia, hyperthyroidism, COPD, CHF, cancer, arthritis, anxiety.   PT Comments    Pt slowly progressing with mobility. Pt tolerated multiple bouts of standing activity with BUE support and intermittent minA. Pt limited by decreased activity tolerance, generalized weakness, poor balance and cognitive impairment, including short-term memory deficits and decreased attention. Little to no DOE noted this session, although pt endorses fatigue. Will continue to follow acutely to address established goals.  SATURATION QUALIFICATIONS: Patient Saturations on Room Air at Rest = 93% Patient Saturations on [employer] while Ambulating = 88% Patient Saturations on -- Liters of oxygen while Ambulating = N/A     Follow Up Recommendations  Home health PT;Supervision/Assistance - 24 hour (at ALF)     Equipment Recommendations  Rolling walker with 5" wheels    Recommendations for Other Services       Precautions / Restrictions Precautions Precautions: Fall;Other (comment) Precaution Comments: R-side chest tube (to d/c 5/3), monitor SpO2 Restrictions Weight Bearing Restrictions: No    Mobility  Bed Mobility               General bed mobility comments: Received sitting in recliner    Transfers   Equipment used: 4-wheeled walker Transfers: Sit to/from Stand Sit to Stand: Min assist;Min guard         General transfer comment: Multiple sit<>stands from recliner to rollator, assist to lock rollator brakes; able to stand 2x with min guard for balance, requiring  increased assist with fatigue, 2x additional stands with minA for trunk elevation; increased time and effort. Pt asks, "How many times doI have to do this?"  Ambulation/Gait         Gait velocity: Decreased   General Gait Details: Pt standing for ~10-sec then attempting to return to sitting, therefore performed bouts of marching in place and steps forwards/backwards in front of recliner with RW and minA for stability; pt performing multiple short bouts of standing activity as she frequently returns to sitting, when asked, pt reports due to fatigue. SpO2 >/88% on RA   Stairs             Wheelchair Mobility    Modified Rankin (Stroke Patients Only)       Balance Overall balance assessment: Needs assistance Sitting-balance support: Feet supported;No upper extremity supported Sitting balance-Leahy Scale: Fair     Standing balance support: During functional activity Standing balance-Leahy Scale: Poor Standing balance comment: Reliant on UE support                            Cognition Arousal/Alertness: Awake/alert Behavior During Therapy: WFL for tasks assessed/performed Overall Cognitive Status: History of cognitive impairments - at baseline Area of Impairment: Orientation;Attention;Memory;Following commands;Safety/judgement;Awareness;Problem solving                 Orientation Level: Disoriented to;Place;Time;Situation Current Attention Level: Focused;Sustained Memory: Decreased short-term memory Following Commands: Follows one step commands consistently Safety/Judgement: Decreased awareness of safety;Decreased awareness of deficits Awareness: Intellectual Problem Solving: Slow processing;Decreased initiation;Difficulty sequencing;Requires verbal cues General Comments: Pt repeatedly asking same  questions, apparent decreased attention and poor short-term memory; following simple commands appropriately, but suspect pt forgets current task she was asked  to complete then moves onto something else (i.e. will stand up when asked, but quick to return to sitting after a few seconds)      Exercises Other Exercises Other Exercises: Repeated sit<>stands, marching in place    General Comments General comments (skin integrity, edema, etc.): SpO2 99% on 3L O2 upon arrival, maintaining 88-93% on RA; HR 80s-90s      Pertinent Vitals/Pain Pain Assessment: Faces Faces Pain Scale: No hurt Pain Intervention(s): Monitored during session    Home Living                      Prior Function            PT Goals (current goals can now be found in the care plan section) Progress towards PT goals: Progressing toward goals    Frequency    Min 3X/week      PT Plan Current plan remains appropriate    Co-evaluation              AM-PAC PT "6 Clicks" Mobility   Outcome Measure  Help needed turning from your back to your side while in a flat bed without using bedrails?: A Little Help needed moving from lying on your back to sitting on the side of a flat bed without using bedrails?: A Little Help needed moving to and from a bed to a chair (including a wheelchair)?: A Little Help needed standing up from a chair using your arms (e.g., wheelchair or bedside chair)?: A Little Help needed to walk in hospital room?: A Little Help needed climbing 3-5 steps with a railing? : A Lot 6 Click Score: 17    End of Session   Activity Tolerance: Patient limited by fatigue Patient left: in chair;with call bell/phone within reach;with chair alarm set Nurse Communication: Mobility status PT Visit Diagnosis: Unsteadiness on feet (R26.81);Other abnormalities of gait and mobility (R26.89);Muscle weakness (generalized) (M62.81);History of falling (Z91.81)     Time: 9678-9381 PT Time Calculation (min) (ACUTE ONLY): 19 min  Charges:  $Therapeutic Exercise: 8-22 mins                     [name], PT, DPT Acute Rehabilitation Services  Pager  [phone] Office [place] 09/25/2020, 11:21 AM

## 2020-09-25 NOTE — Telephone Encounter (Signed)
JD-  Pt was scheduled on 05/24 at 10 with Geraldo Pitter and I have placed the order for her to get the CXR first.  She will need to show up about 15-20 mins early.  Thanks

## 2020-09-25 NOTE — Progress Notes (Signed)
Daily Progress Note   Patient Name: [name] Vaughn       Date: 09/25/2020 DOB: 04-20-1941  Age: 80 y.o. MRN#: [id number] Attending Physician: [name] Haff, MD Primary Care Physician: [name] Pepper, MD Admit Date: 09/18/2020  Reason for Consultation/Follow-up: Establishing goals of care  Subjective: Chart review performed.  Received report from primary RN -no acute concerns.  RN states chest tube was removed today and patient tolerating well; she is still requiring supplemental oxygen.  Per RN, patient remains confused and continues with poor oral intake.  Reviewed sons goal for patient to discharge to [name]  with hospice in [place] with TOC.   Went to visit patient at bedside -no family/visitors present.  Patient was sitting up in chair awake, alert, oriented to self only, and able to participate in simple conversation.  She is not able to make complex medical decisions. No signs or non-verbal gestures of pain or discomfort noted. No respiratory distress, increased work of breathing, or secretions noted.  She is on 4 L O2 nasal cannula.   Called son/[name] Vaughn to provide support.  Reviewed interval history since yesterday.  [name] Vaughn states goals remain the same -he would still like LTC on discharge with hospice to follow.  [name] Vaughn states today that he feels very relieved that TOC can assist with patient's transition.  [name] Vaughn reiterates that family are not able to care for patient at discharge and returning to ALF is not the safest option for her, which I agree -he request patient not be discharged until safe disposition can be arranged.  Validated that the medical team's goal is to have safe disposition arranged before discharge.  [name] Vaughn states that pending information from [facility] he may consider patient remain in [place] for LTC placement with hospice.  We discussed transportation options per his request which include private vehicle versus nonemergent transport -he is interested in also discussing these options with TOC.  All questions and concerns addressed. Encouraged to call with questions and/or concerns. PMT number previously provided.  Length of Stay: 7  Current Medications: Scheduled Meds:  . amoxicillin-clavulanate  1 tablet Oral Q12H  . anastrozole  1 mg Oral Daily  . arformoterol  15 mcg Nebulization BID  . atorvastatin  40 mg Oral QHS  . budesonide (PULMICORT) nebulizer solution  0.5 mg Nebulization BID  . carvedilol  3.125 mg Oral BID WC  . docusate sodium  100 mg Oral BID  . enoxaparin (LOVENOX) injection  40 mg Subcutaneous Q24H  . famotidine  40 mg Oral QHS  . gabapentin  200 mg Oral BID  . levothyroxine  25 mcg Oral QAC breakfast  . memantine  10 mg Oral BID  . mirabegron ER  25 mg Oral Daily  . polyethylene glycol  17 g Oral Daily  . saccharomyces boulardii  250 mg Oral BID  . venlafaxine XR  150 mg Oral Q breakfast    Continuous Infusions:   PRN Meds: acetaminophen, albuterol, ondansetron (ZOFRAN) IV, oxyCODONE, Resource ThickenUp Clear  Physical Exam Vitals and nursing note reviewed.  Constitutional:      General: She is not in acute distress.    Appearance: She is cachectic. She is ill-appearing.  Pulmonary:     Effort: No respiratory distress.  Skin:    General: Skin is warm and dry.  Neurological:     Mental Status: She is alert. She is disoriented and confused.     Motor: Weakness present.  Psychiatric:        Attention and Perception: Attention normal.        Behavior: Behavior is cooperative.        Cognition and Memory: Cognition is impaired. Memory is impaired.             Vital Signs: BP 111/61   Pulse 78   Temp 98.6 F (37 C) (Oral)   Resp 16   Ht 5\' 2"  (1.575 m)   Wt 53.2 kg   SpO2 99%   BMI 21.45 kg/m  SpO2: SpO2: 99 % O2 Device:  O2 Device: Nasal Cannula O2 Flow Rate: O2 Flow Rate (L/min): 2 L/min  Intake/output summary:   Intake/Output Summary (Last 24 hours) at 09/25/2020 1416 Last data filed at 09/25/2020 6644 Gross per 24 hour  Intake 1113.78 ml  Output 470 ml  Net 643.78 ml   LBM: Last BM Date: 09/23/20 Baseline Weight: Weight: 55 kg Most recent weight: Weight: 53.2 kg       Palliative Assessment/Data: 30 to 40%      Patient Active Problem List   Diagnosis Date Noted  . Pleural effusion   . Empyema lung ([place]) 09/18/2020  . Sepsis with acute respiratory failure ([place]) 09/18/2020  . Thrombocytosis 09/18/2020  . Leukocytosis 09/18/2020  . Elevated troponin 09/18/2020  . Dementia ([place]) 09/18/2020  . COPD with acute exacerbation ([place]) 01/30/2016  . Cough 01/30/2016  . Breast cancer of lower-inner quadrant of left female breast ([place]) 01/29/2016  . GI bleed 09/26/2015  . Acute GI bleeding 09/26/2015  . Chest tube in place 09/26/2015  . HLD (hyperlipidemia) 09/26/2015  . GI bleeding 09/26/2015  . Acute pulmonary embolism ([place]) 07/13/2013  . Acute renal failure ([place]) 07/08/2013  . Metabolic acidosis 03/47/4259  . Hypokalemia 07/08/2013  . Anemia 07/08/2013  . History of breast cancer 07/08/2013  . CHF (congestive heart failure) ([place]) 07/08/2013  . Diarrhea 07/08/2013  . Renal failure 07/07/2013    Palliative Care Assessment & Plan   Patient Profile: 80 y.o.femalewith past medical history of COPD, systolic CHF (EF 56-38%), hypothyroidism, dementia, and breast cancer s/p left mastectomy who presented to the emergency departmenton 4/26/2022with shortness of breath.At baseline, she wears oxygen 2L at night. Family reporting that patient was getting choked up and coughing while eating and talking. ED Course: Required NRB mask to maintain O2 saturations. WBC 47.1, tachycradia, and tachypnea. Chest  x-ray showed right lung base infiltrate with moderate right-sided pleural effusion.  Ad[facility] to  [facility] with sepsis secondary to right empyema and acute hypoxic respiratory failure. Right chest tube placed 4/27.  Assessment: loculated right pleural effusion Chest pain and dyspnea COPD with acute exacerbation Chronic systolic CHF Dementia History of breast cancer  Recommendations/Plan: Continue current medical treatment Continue DNR/DNI as previously documented Son's goal remains for patient discharge to LTC with hospice.  He originally wanted patient transported to [place] to be closer to him; however, pending his conversation with TOC he may decide for patient to remain in [place] notified PMT will continue to follow and support holistically  Goals of Care and Additional Recommendations: Limitations on Scope of Treatment: Full Scope Treatment, No Artificial Feeding and No Tracheostomy  Code Status:    Code Status Orders  (From admission, onward)         Start     Ordered   09/19/20 1409  Do not attempt resuscitation (DNR)  Continuous       Question Answer Comment  In the event of cardiac or respiratory ARREST Do not call a "code blue"   In the event of cardiac or respiratory ARREST Do not perform Intubation, CPR, defibrillation or ACLS   In the event of cardiac or respiratory ARREST Use medication by any route, position, wound care, and other measures to relive pain and suffering. May use oxygen, suction and manual treatment of airway obstruction as needed for comfort.      09/19/20 1408        Code Status History    Date Active Date Inactive Code Status Order ID Comments User Context   09/18/2020 1527 09/19/2020 1408 Full Code [id number]  Norval Morton, MD ED   02/26/2016 1324 02/27/2016 1446 Full Code [id number]  Coralie Keens, MD Inpatient   09/26/2015 2255 09/27/2015 1841 DNR [id number]  [name], MD ED   07/08/2013 0327 07/14/2013 1942 Full Code [id number]  [name], MD ED   Advance Care Planning Activity      Prognosis:  Poor in  the setting of advanced age, dementia, poor p.o. intake, increased risk for recurrent aspiration pneumonia, and multiple comorbidities   Discharge Planning: [place] with Hospice  Care plan was discussed with primary RN, patient's son, TOC, Dr. [place] Pink  Thank you for allowing the Palliative Medicine Team to assist in the care of this patient.   Total Time  31 minutes Prolonged Time Billed  no       Greater than 50%  of this time was spent counseling and coordinating care related to the above assessment and plan.  Lin Landsman, NP  Please contact Palliative Medicine Team phone at [phone] for questions and concerns.   *Portions of this note are a verbal dictation therefore any spelling and/or grammatical errors are due to the "[employer]" system interpretation.

## 2020-09-25 NOTE — Progress Notes (Signed)
[name] is a 80 y.o. female  s/p right chest tube placement for loculated right pleural effusion on 09/20/20 with Dr. Annamaria Boots.   Request was made by primary/pulmonary team to remove the chest tube.   Chest tube was removed at the bedside.  Retention suture was not able to be removed, cut at the skin level.  Petroleum gauze and tegaderm was placed.   Patient tolerated procedure well.   CXR ordered.   Please call IR for questions and concerns regarding chest tube removal.    [name] H Mary Pizana PA-C 09/25/2020 12:05 PM

## 2020-09-26 ENCOUNTER — Inpatient Hospital Stay (HOSPITAL_COMMUNITY): Payer: Medicare Other

## 2020-09-26 DIAGNOSIS — R1310 Dysphagia, unspecified: Secondary | ICD-10-CM

## 2020-09-26 DIAGNOSIS — D72829 Elevated white blood cell count, unspecified: Secondary | ICD-10-CM | POA: Diagnosis not present

## 2020-09-26 LAB — CBC
HCT: 31.9 % — ABNORMAL LOW (ref 36.0–46.0)
Hemoglobin: 10.5 g/dL — ABNORMAL LOW (ref 12.0–15.0)
MCH: 29.4 pg (ref 26.0–34.0)
MCHC: 32.9 g/dL (ref 30.0–36.0)
MCV: 89.4 fL (ref 80.0–100.0)
Platelets: 325 10*3/uL (ref 150–400)
RBC: 3.57 MIL/uL — ABNORMAL LOW (ref 3.87–5.11)
RDW: 13.1 % (ref 11.5–15.5)
WBC: 18.4 10*3/uL — ABNORMAL HIGH (ref 4.0–10.5)
nRBC: 0 % (ref 0.0–0.2)

## 2020-09-26 LAB — BASIC METABOLIC PANEL
Anion gap: 8 (ref 5–15)
BUN: 20 mg/dL (ref 8–23)
CO2: 28 mmol/L (ref 22–32)
Calcium: 8.1 mg/dL — ABNORMAL LOW (ref 8.9–10.3)
Chloride: 101 mmol/L (ref 98–111)
Creatinine, Ser: 1 mg/dL (ref 0.44–1.00)
GFR, Estimated: 57 mL/min — ABNORMAL LOW (ref >60)
Glucose, Bld: 51 mg/dL — ABNORMAL LOW (ref 70–99)
Potassium: 3.6 mmol/L (ref 3.5–5.1)
Sodium: 137 mmol/L (ref 135–145)

## 2020-09-26 LAB — GLUCOSE, CAPILLARY
Glucose-Capillary: 136 mg/dL — ABNORMAL HIGH (ref 70–99)
Glucose-Capillary: 145 mg/dL — ABNORMAL HIGH (ref 70–99)
Glucose-Capillary: 157 mg/dL — ABNORMAL HIGH (ref 70–99)
Glucose-Capillary: 50 mg/dL — ABNORMAL LOW (ref 70–99)
Glucose-Capillary: 71 mg/dL (ref 70–99)
Glucose-Capillary: 94 mg/dL (ref 70–99)

## 2020-09-26 NOTE — Progress Notes (Signed)
[facility] PCCM  CXR from this morning reviewed, no significant change Patient to follow up with Dr. Erin Fulling in clinic No new recommendations PCCM to sign off  Roselie Awkward, MD [place] [facility] Pager: [phone] Cell: [phone] If no response, please call [phone] until 7pm After 7:00 pm call Elink  [phone]

## 2020-09-26 NOTE — Progress Notes (Signed)
Patient blood glucose noted to be low from AM labs, rechecked with glucometer. Proceeded to give orange juice (per normal hypoglycemia protocol), recheck at 0655. Glucose then 71. Recheck again at [facility]. Hospitalist notified.

## 2020-09-26 NOTE — Progress Notes (Signed)
[facility] PROGRESS NOTE   [name] LGX:[id number] DOB: 1941/04/16 DOA: 09/18/2020  PCP: Farris Has, MD  Brief History/Interval Summary: 80 y.o. female with medical history significant of COPD,  chronic systolic CHF 40-45%, hypothyroidism, breast cancer s/p left mastectomy presents from [facility] due to shortness of breath.  Patient with history of dementia and so history was limited.  Supposed to be on home oxygen at 2 L/min.  Presented with shortness of breath.  Was found to have a loculated right-sided pleural effusion.  Hospitalized for further management.    Consultants: Cardiothoracic surgery.  Interventional radiology.  Pulmonology  Procedures: Right-sided chest tube placement  Antibiotics: Anti-infectives (From admission, onward)   Start     Dose/Rate Route Frequency Ordered Stop   09/25/20 1400  amoxicillin-clavulanate (AUGMENTIN) 875-125 MG per tablet 1 tablet  Status:  Discontinued        1 tablet Oral Every 12 hours 09/25/20 1300 09/25/20 1312   09/25/20 1400  amoxicillin-clavulanate (AUGMENTIN) 500-125 MG per tablet 500 mg        1 tablet Oral Every 12 hours 09/25/20 1313 10/03/20 0959   09/25/20 0845  Ampicillin-Sulbactam (UNASYN) 3 g in sodium chloride 0.9 % 100 mL IVPB  Status:  Discontinued        3 g 200 mL/hr over 30 Minutes Intravenous Every 8 hours 09/25/20 0835 09/25/20 1300   09/25/20 0030  Ampicillin-Sulbactam (UNASYN) 3 g in sodium chloride 0.9 % 100 mL IVPB  Status:  Discontinued        3 g 200 mL/hr over 30 Minutes Intravenous Every 12 hours 09/24/20 1237 09/25/20 0835   09/22/20 1200  Ampicillin-Sulbactam (UNASYN) 3 g in sodium chloride 0.9 % 100 mL IVPB  Status:  Discontinued        3 g 200 mL/hr over 30 Minutes Intravenous Every 8 hours 09/22/20 1050 09/24/20 1237   09/19/20 1100  vancomycin (VANCOREADY) IVPB 750 mg/150 mL  Status:  Discontinued        750 mg 150 mL/hr over 60 Minutes Intravenous Every 24 hours 09/18/20 1255 09/21/20  1038   09/18/20 1600  cefTRIAXone (ROCEPHIN) 2 g in sodium chloride 0.9 % 100 mL IVPB  Status:  Discontinued        2 g 200 mL/hr over 30 Minutes Intravenous Every 24 hours 09/18/20 1526 09/22/20 1025   09/18/20 1600  azithromycin (ZITHROMAX) 500 mg in sodium chloride 0.9 % 250 mL IVPB  Status:  Discontinued        500 mg 250 mL/hr over 60 Minutes Intravenous Every 24 hours 09/18/20 1526 09/22/20 1025   09/18/20 0945  vancomycin (VANCOREADY) IVPB 1250 mg/250 mL        1,250 mg 166.7 mL/hr over 90 Minutes Intravenous  Once 09/18/20 0932 09/18/20 1334   09/18/20 0930  ceFEPIme (MAXIPIME) 2 g in sodium chloride 0.9 % 100 mL IVPB        2 g 200 mL/hr over 30 Minutes Intravenous  Once 09/18/20 0926 09/18/20 1043      Subjective/Interval History: "A little belly pain" but denies any other complaints.  Specifically denies dyspnea or chest pain.  As per RN, choked on breakfast (eggs) this morning and in the process of requesting speech therapy to reevaluate.  Also with high colored urine.   Assessment/Plan:  Loculated right-sided pleural effusion/severe sepsis present on admission/acute on chronic respiratory failure with hypoxia Due to loculated effusion. Cardiothoracic surgery was consulted.  Seen by Dr. Laneta Simmers.  Not thought  to be a surgical candidate.  He recommended evaluation by IR.  Discussed with patient's son who was agreeable to procedures to drain this collection.  Patient underwent chest tube placement on 4/27. Gram stain did not show any organisms.  Total WBC in the pleural fluid was 1264.  Pleural fluid culture from 4/28: Negative, final report. Patient's antibiotic regimen was changed.  IV cefepime 4/26.  IV azithromycin, ceftriaxone 4/26/4/29, IV vancomycin 4/26/4/29, IV Unasyn 4/30- 5/3, Augmentin 5/3- 5/10.  Procalcitonin level improved to 0.49.   WBC had been improving and was down to 12.9 but then started rising again.  Could be due to tPA per pulmonology notes.  Hovering in the  17-18 range for the last 2 days.  Afebrile. Appreciate pulmonology assistance with management of chest tube.  She underwent dornase/tPA instillation per pulmonology.  Significant improvement in the pleural effusion noted on serial chest x-rays.  Chest tube discontinued 5/3. Discussed with nursing staff and they will continue to wean her down on her oxygen requirements as much as possible.  She may need home oxygen.  As per earlier notes, reportedly on 2 L/min home oxygen. There was some concern for aspiration.  Patient was seen by speech therapy.  Currently on a dysphagia 2 diet and nectar thickened liquids but patient reportedly choked on her breakfast 5/4 and hence requesting ST to reassess.  Antibiotics changed to Augmentin to complete course 10/02/2020. Chest x-ray stable.  PCCM signed off 5/4.  Outpatient follow-up with Dr. Erin Fulling, PCCM.  Dysphagia As per speech therapy evaluation 5/2, was on dysphagia 2 diet and nectar thickened liquids but ongoing issues with choking this morning.  Requesting ST to reassess.  Hypoglycemia CBG 50 along with blood glucose 51 on labs.  Treated per hypoglycemia protocol.  Likely due to poor oral intake.  Monitor closely and treat as needed  Leukocytosis  Unclear if this is all only related to infectious etiology.  Presented with WBC as high as 47, neutrophilic.  This has come down to 17-18 range, continue to follow  Chest pain and dyspnea Symptoms likely due to her pleural effusion.  No ischemic findings noted on EKG.  Continue to treat symptomatically for now.  She is not a candidate for aggressive work-up.  No recurrence of chest pain.  Acute kidney injury Creatinine had peaked to 1.36, likely due to poor oral intake.  Briefly hydrated with IV fluids.  AKI resolved but remains at risk for recurrent dehydration and AKI if she has inconsistent oral intake  COPD with acute exacerbation Seems to have improved with steroids and nebulizer treatments.  Acute  exacerbation resolved  Mildly elevated troponin Likely due to demand ischemia.  EKG without any acute changes.  No angina.  Normocytic anemia Hemoglobin is stable in the 10 g range.  No evidence of overt blood loss.  Chronic systolic CHF Last echocardiogram showed a EF of 40 to 45%.  Clinically euvolemic.  Essential hypertension Continue home medications.  Monitor blood pressures.  Potassium abnormalities Initially hyperkalemic and then subsequently hypokalemic.  Supplement as needed.  Resolved.  History of dementia Patient apparently has significant dementia per family.  Reorient daily.  Delirium precautions.  Hypothyroidism TSH noted to be normal.  Continue levothyroxine.  History of breast cancer Seems to be stable.  Noted to be on anastrozole.  History of GERD Continue Pepcid.  Goals of care Palliative care is following and communicating with family.  At this time plan is for patient to go to long-term care with  hospice.  Patient's son lives in [place] and would prefer for the patient to be close to him.  TOC is following.    DVT Prophylaxis: Lovenox Code Status: DNR Family Communication: None at bedside today. Disposition Plan: Disposition to be determined.  PT and OT recommends home health.  Will likely need home oxygen as well.  Status is: Inpatient  Remains inpatient appropriate because:IV treatments appropriate due to intensity of illness or inability to take PO and Inpatient level of care appropriate due to severity of illness.  Current issues with dysphagia/choking and hypoglycemia this morning.   Dispo: The patient is from: Home              Anticipated d/c is to: LTC with hospice?  Locally in [place] versus [place] where son lives              Patient currently is not medically stable to d/c.   Difficult to place patient No       Medications:  Scheduled: . amoxicillin-clavulanate  1 tablet Oral Q12H  . anastrozole  1 mg Oral Daily  .  arformoterol  15 mcg Nebulization BID  . atorvastatin  40 mg Oral QHS  . budesonide (PULMICORT) nebulizer solution  0.5 mg Nebulization BID  . carvedilol  3.125 mg Oral BID WC  . docusate sodium  100 mg Oral BID  . enoxaparin (LOVENOX) injection  40 mg Subcutaneous Q24H  . famotidine  40 mg Oral QHS  . gabapentin  200 mg Oral BID  . levothyroxine  25 mcg Oral QAC breakfast  . memantine  10 mg Oral BID  . mirabegron ER  25 mg Oral Daily  . polyethylene glycol  17 g Oral Daily  . saccharomyces boulardii  250 mg Oral BID  . venlafaxine XR  150 mg Oral Q breakfast   Continuous:  NWG:[id number], albuterol, ondansetron (ZOFRAN) IV, oxyCODONE, Resource ThickenUp Clear   Objective:  Vital Signs  Vitals:   09/25/20 1953 09/26/20 0409 09/26/20 0738 09/26/20 0739  BP:  134/74    Pulse: 81 79    Resp: 16 18    Temp:  (!) 97.5 F (36.4 C)    TempSrc:  Oral    SpO2: 94% 92% 90% 90%  Weight:  51.4 kg    Height:        Intake/Output Summary (Last 24 hours) at 09/26/2020 1046 Last data filed at 09/26/2020 0400 Gross per 24 hour  Intake 120 ml  Output 300 ml  Net -180 ml   Filed Weights   09/24/20 0509 09/25/20 0415 09/26/20 0409  Weight: 53 kg 53.2 kg 51.4 kg    General exam: Pleasant elderly female, moderately built and nourished lying comfortably propped up in bed without distress. Respiratory system: Diminished breath sounds in the bases with occasional crackles but otherwise clear to auscultation without wheezing or rhonchi.  No increased work of breathing. Cardiovascular system: S1 & S2 heard, RRR. No JVD, murmurs, rubs, gallops or clicks. No pedal edema.  Telemetry personally reviewed: Sinus rhythm. Gastrointestinal system: Abdomen is nondistended, soft and nontender. No organomegaly or masses felt. Normal bowel sounds heard. Central nervous system: Alert and oriented only to self. No focal neurological deficits. Extremities: Symmetric 5 x 5 power. Skin: No rashes, lesions  or ulcers Psychiatry: Judgement and insight impaired. Mood & affect appropriate.       Lab Results:  Data Reviewed: I have personally reviewed following labs and imaging studies  CBC: Recent Labs  Lab 09/22/20  0202 09/23/20 0215 09/24/20 0750 09/25/20 0549 09/26/20 0448  WBC 24.6* 32.6* 21.6* 17.1* 18.4*  HGB 12.9 13.1 11.7* 10.2* 10.5*  HCT 40.1 41.5 36.6 32.3* 31.9*  MCV 92.2 92.6 91.5 92.0 89.4  PLT 375 350 338 322 [id number]    Basic Metabolic Panel: Recent Labs  Lab 09/22/20 0202 09/23/20 0215 09/24/20 0750 09/25/20 0549 09/26/20 0448  NA 135 136 137 135 137  K 4.4 3.7 4.5 3.9 3.6  CL 98 98 98 99 101  CO2 24 27 27 29 28   GLUCOSE 121* 173* 99 87 51*  BUN 13 23 31* 25* 20  CREATININE 0.66 0.97 1.36* 1.10* 1.00  CALCIUM 8.3* 8.5* 8.6* 8.1* 8.1*    GFR: Estimated Creatinine Clearance: 36.1 mL/min (by C-G formula based on SCr of 1 mg/dL).     Recent Results (from the past 240 hour(s))  Resp Panel by RT-PCR (Flu A&B, Covid) Nasopharyngeal Swab     Status: None   Collection Time: 09/18/20  8:00 AM   Specimen: Nasopharyngeal Swab; Nasopharyngeal(NP) swabs in vial transport medium  Result Value Ref Range Status   SARS Coronavirus 2 by RT PCR NEGATIVE NEGATIVE Final    Comment: (NOTE) SARS-CoV-2 target nucleic acids are NOT DETECTED.  The SARS-CoV-2 RNA is generally detectable in upper respiratory specimens during the acute phase of infection. The lowest concentration of SARS-CoV-2 viral copies this assay can detect is 138 copies/mL. A negative result does not preclude SARS-Cov-2 infection and should not be used as the sole basis for treatment or other patient management decisions. A negative result may occur with  improper specimen collection/handling, submission of specimen other than nasopharyngeal swab, presence of viral mutation(s) within the areas targeted by this assay, and inadequate number of viral copies(<138 copies/mL). A negative result must be  combined with clinical observations, patient history, and epidemiological information. The expected result is Negative.  Fact Sheet for Patients:  EntrepreneurPulse.com.au  Fact Sheet for Healthcare Providers:  IncredibleEmployment.be  This test is no t yet approved or cleared by the [place] FDA and  has been authorized for detection and/or diagnosis of SARS-CoV-2 by FDA under an Emergency Use Authorization (EUA). This EUA will remain  in effect (meaning this test can be used) for the duration of the COVID-19 declaration under Section 564(b)(1) of the Act, 21 U.S.C.section 360bbb-3(b)(1), unless the authorization is terminated  or revoked sooner.       Influenza A by PCR NEGATIVE NEGATIVE Final   Influenza B by PCR NEGATIVE NEGATIVE Final    Comment: (NOTE) The Xpert Xpress SARS-CoV-2/FLU/RSV plus assay is intended as an aid in the diagnosis of influenza from Nasopharyngeal swab specimens and should not be used as a sole basis for treatment. Nasal washings and aspirates are unacceptable for Xpert Xpress SARS-CoV-2/FLU/RSV testing.  Fact Sheet for Patients: EntrepreneurPulse.com.au  Fact Sheet for Healthcare Providers: IncredibleEmployment.be  This test is not yet approved or cleared by the [place] FDA and has been authorized for detection and/or diagnosis of SARS-CoV-2 by FDA under an Emergency Use Authorization (EUA). This EUA will remain in effect (meaning this test can be used) for the duration of the COVID-19 declaration under Section 564(b)(1) of the Act, 21 U.S.C. section 360bbb-3(b)(1), unless the authorization is terminated or revoked.  Performed at [facility], [place] [address], [place], [place] [address]   Blood culture (routine x 2)     Status: None   Collection Time: 09/18/20  9:22 AM   Specimen: BLOOD  Result Value Ref Range Status   Specimen Description BLOOD BLOOD  RIGHT FOREARM  Final   Special Requests   Final    BOTTLES DRAWN AEROBIC AND ANAEROBIC Blood Culture results may not be optimal due to an inadequate volume of blood received in culture bottles   Culture   Final    NO GROWTH 5 DAYS Performed at [facility], [place] [address], [place], [place] [address]    Report Status 09/23/2020 FINAL  Final  Blood culture (routine x 2)     Status: None   Collection Time: 09/18/20  9:27 AM   Specimen: BLOOD  Result Value Ref Range Status   Specimen Description BLOOD RIGHT ANTECUBITAL  Final   Special Requests   Final    BOTTLES DRAWN AEROBIC AND ANAEROBIC Blood Culture results may not be optimal due to an inadequate volume of blood received in culture bottles   Culture   Final    NO GROWTH 5 DAYS Performed at [facility], [place] [address], [place], [place] [address]    Report Status 09/23/2020 FINAL  Final  Culture, body fluid w Gram Stain-bottle     Status: None   Collection Time: 09/19/20  4:46 PM   Specimen: Fluid  Result Value Ref Range Status   Specimen Description FLUID PLEURAL RIGHT  Final   Special Requests BOTTLES DRAWN AEROBIC AND ANAEROBIC  Final   Culture   Final    NO GROWTH 5 DAYS Performed at [facility], [place] [address] [place], [place] [address]    Report Status 09/24/2020 FINAL  Final  Gram stain     Status: None   Collection Time: 09/19/20  4:46 PM   Specimen: Fluid  Result Value Ref Range Status   Specimen Description FLUID PLEURAL RIGHT  Final   Special Requests NONE  Final   Gram Stain   Final    RARE WBC PRESENT, PREDOMINANTLY PMN NO ORGANISMS SEEN Performed at [facility], [place] [address] [place], [place] [address]    Report Status 09/20/2020 FINAL  Final  MRSA PCR Screening     Status: None   Collection Time: 09/20/20  4:30 AM   Specimen: Nasal Mucosa; Nasopharyngeal  Result Value Ref Range Status   MRSA by PCR NEGATIVE NEGATIVE Final    Comment:        The GeneXpert MRSA Assay  (FDA approved for NASAL specimens only), is one component of a comprehensive MRSA colonization surveillance program. It is not intended to diagnose MRSA infection nor to guide or monitor treatment for MRSA infections. Performed at [facility], [place] [address] [place], [place] [address]   Urine Culture     Status: Abnormal   Collection Time: 09/23/20  6:56 PM   Specimen: Urine, Random  Result Value Ref Range Status   Specimen Description URINE, RANDOM  Final   Special Requests   Final    Unasyn Normal Performed at [facility], [place] [address], [place], [place] [address]    Culture 10,000 COLONIES/mL YEAST (A)  Final   Report Status 09/25/2020 FINAL  Final      Radiology Studies: DG Chest Port 1 View  Result Date: 09/26/2020 CLINICAL DATA:  Shortness of breath.  Chest tube removal. EXAM: PORTABLE CHEST 1 VIEW COMPARISON:  09/25/2020. FINDINGS: Mediastinum and hilar structures normal. Mild right base atelectasis/infiltrate. No pleural effusion or pneumothorax. Stable mild left apical pleural thickening consistent scarring. Heart size normal.  No acute bony abnormality. IMPRESSION: Mild right base atelectasis/infiltrate.  No pneumothorax. Electronically Signed   By: Marcello Moores  Register   On: 09/26/2020 07:20   DG Chest Port 1 View  Result Date: 09/25/2020 CLINICAL DATA:  Chest tube removal. EXAM: PORTABLE CHEST 1 VIEW COMPARISON:  No prior. FINDINGS: Mediastinum and hilar structures normal. Mild atelectasis/infiltrate right lung base. No pleural effusion or pneumothorax. Oral contrast in the colon. Heart size normal. Degenerative changes scoliosis thoracic spine. IMPRESSION: Mild atelectasis right lung base.  No evidence of pneumothorax. Electronically Signed   By: Marcello Moores  Register   On: 09/25/2020 15:25   DG Chest Port 1 View  Result Date: 09/25/2020 CLINICAL DATA:  Status post chest tube removal today. EXAM: PORTABLE CHEST 1 VIEW COMPARISON:  Single-view of the chest earlier  today. FINDINGS: Pigtail catheter in the right chest has been removed. No pneumothorax. Mild right basilar opacities are unchanged. The left lung is expanded and clear. Heart size is normal. Aortic atherosclerosis. IMPRESSION: Negative for pneumothorax after right chest tube removal. Electronically Signed   By: Inge Rise M.D.   On: 09/25/2020 13:20   DG CHEST PORT 1 VIEW  Result Date: 09/25/2020 CLINICAL DATA:  Pleural effusion.  Chest tube. EXAM: PORTABLE CHEST 1 VIEW COMPARISON:  09/24/2020. FINDINGS: Right chest tube in stable position. No pneumothorax. Mild right base atelectasis again noted. Stable calcification left upper lung most consistent granuloma. Tiny right pleural effusion cannot be excluded. Heart size normal. Mild right chest wall subcutaneous emphysema. IMPRESSION: Right chest tube in stable position. No pneumothorax. Mild right base atelectasis again noted. Tiny right pleural effusion cannot be excluded. Electronically Signed   By: Marcello Moores  Register   On: 09/25/2020 05:45       LOS: 8 days   Vernell Leep, MD, [place], [place]. [facility]  To contact the attending provider between 7A-7P or the covering provider during after hours 7P-7A, please log into the web site [email or web address] and access using universal [place] password for that web site. If you do not have the password, please call the hospital operator.  09/26/2020, 10:46 AM

## 2020-09-26 NOTE — Plan of Care (Signed)
  Problem: Education: Goal: Knowledge of General Education information will improve Description Including pain rating scale, medication(s)/side effects and non-pharmacologic comfort measures Outcome: Progressing   Problem: Health Behavior/Discharge Planning: Goal: Ability to manage health-related needs will improve Outcome: Progressing   

## 2020-09-26 NOTE — Progress Notes (Signed)
  Speech Language Pathology Treatment: Dysphagia  Patient Details Name: [name] MRN: [id number] DOB: 16-Jan-1941 Today's Date: 09/26/2020 Time: 5784-6962 SLP Time Calculation (min) (ACUTE ONLY): 12 min  Assessment / Plan / Recommendation Clinical Impression  Pt was seen for f/u dysphagia tx with RN reporting coughing and concern for aspiration specifically with chopped foods at breakfast. SLP provided trials of purees with a prolonged coughing episode that seemed to occur when pt started taking larger bites at a faster rate. Given MBS results, pt is at risk for aspiration with solids considering pharyngeal residue, and she continues to need frequent cues to reinforce precautions to regulate pacing and use throat clearing to try to expel penetrates. Will adjust diet to Dys 1 (pureed) solids and nectar thick liquids, still with full supervision to reduce risk as much as possible, although given level of dysphagia and cognitive impairments, risk may not be fully eliminated. Per palliative care notes, family is more interested in pursuing hospice care and would not want artificial feeding, so this may be the least restrictive diet at the moment.   HPI HPI: Pt is a 80 yo female presenting with SOB, admitted with sepsis secondary to R empyema with respiratory failure. CXR concerning for R-sided PNA with moderate pleural effusion. Family reported coughing while eating and talking. PMH includes: dementia, COPD, systolic CHF, hypothyroidism, breast ca s/p L mastectomy, recent fall      SLP Plan  Continue with current plan of care       Recommendations  Diet recommendations: Dysphagia 1 (puree);Nectar-thick liquid Liquids provided via: Cup;Straw Medication Administration: Crushed with puree Supervision: Patient able to self feed;Full supervision/cueing for compensatory strategies Compensations: Minimize environmental distractions;Slow rate;Small sips/bites;Clear throat intermittently Postural  Changes and/or Swallow Maneuvers: Seated upright 90 degrees;Upright 30-60 min after meal                Oral Care Recommendations: Oral care QID Follow up Recommendations: Other (comment) (LTC with hospice) SLP Visit Diagnosis: Dysphagia, oropharyngeal phase (R13.12) Plan: Continue with current plan of care       GO                [name]., M.A. [facility] Pager [phone] Office [phone]  09/26/2020, 11:19 AM

## 2020-09-27 DIAGNOSIS — R1310 Dysphagia, unspecified: Secondary | ICD-10-CM | POA: Diagnosis not present

## 2020-09-27 LAB — CBC WITH DIFFERENTIAL/PLATELET
Abs Immature Granulocytes: 0.31 10*3/uL — ABNORMAL HIGH (ref 0.00–0.07)
Basophils Absolute: 0 10*3/uL (ref 0.0–0.1)
Basophils Relative: 0 %
Eosinophils Absolute: 0.2 10*3/uL (ref 0.0–0.5)
Eosinophils Relative: 1 %
HCT: 32.9 % — ABNORMAL LOW (ref 36.0–46.0)
Hemoglobin: 10.5 g/dL — ABNORMAL LOW (ref 12.0–15.0)
Immature Granulocytes: 2 %
Lymphocytes Relative: 9 %
Lymphs Abs: 1.5 10*3/uL (ref 0.7–4.0)
MCH: 29 pg (ref 26.0–34.0)
MCHC: 31.9 g/dL (ref 30.0–36.0)
MCV: 90.9 fL (ref 80.0–100.0)
Monocytes Absolute: 1.2 10*3/uL — ABNORMAL HIGH (ref 0.1–1.0)
Monocytes Relative: 7 %
Neutro Abs: 13.6 10*3/uL — ABNORMAL HIGH (ref 1.7–7.7)
Neutrophils Relative %: 81 %
Platelets: 329 10*3/uL (ref 150–400)
RBC: 3.62 MIL/uL — ABNORMAL LOW (ref 3.87–5.11)
RDW: 13.2 % (ref 11.5–15.5)
WBC: 16.8 10*3/uL — ABNORMAL HIGH (ref 4.0–10.5)
nRBC: 0 % (ref 0.0–0.2)

## 2020-09-27 LAB — GLUCOSE, CAPILLARY
Glucose-Capillary: 100 mg/dL — ABNORMAL HIGH (ref 70–99)
Glucose-Capillary: 103 mg/dL — ABNORMAL HIGH (ref 70–99)
Glucose-Capillary: 117 mg/dL — ABNORMAL HIGH (ref 70–99)
Glucose-Capillary: 94 mg/dL (ref 70–99)
Glucose-Capillary: 102 mg/dL — ABNORMAL HIGH (ref 70–99)

## 2020-09-27 NOTE — TOC Progression Note (Signed)
Transition of Care [facility]) - Progression Note    Patient Details  Name: [name] MRN: [id number] Date of Birth: Oct 26, 1940  [facility] CM/SW Contact  Reece Agar, [place] Phone Number: 09/27/2020, 2:09 PM  Clinical Narrative:    2:00pm- CSW contacted son to provide update on facilities that are able to accept pt. There was no answer or voicemail set up CSW will follow up.        Expected Discharge Plan and Services                                                 Social Determinants of Health (SDOH) Interventions    Readmission Risk Interventions No flowsheet data found.

## 2020-09-27 NOTE — Plan of Care (Signed)

## 2020-09-27 NOTE — Progress Notes (Signed)
Occupational Therapy Treatment Patient Details Name: [name] MRN: [id number] DOB: 12-06-1940 Today's Date: 09/27/2020    History of present illness Pt is a 80 y.o. female who presented 09/18/20 with SOB, choking/coughing with eating and talking, weight loss. Of note, pt in ED 2 days PTA with a fall and c/o chest pain. CT chest demonstrated loculated right basilar effusion. S/p R chest tube placement 4/27. PMH includes dementia, osteopenia, hyperthyroidism, COPD, CHF, cancer, arthritis, anxiety.   OT comments  Pt making progress with functional goals. Pt in happy mood and smiling; pt's sister from out of town visiting with her. Session focused on bed mobility for OOB, functional mobility with RW to bathroom, toilet transfers, toileting tasks, grooming/hygiene standing at sink and functional activity tolerance. Pt left in recliner eating lunch at end of session with Posey alarm in place. OT will continue to follow acutely to maximize level of function and safety  Follow Up Recommendations  Home health OT;Supervision/Assistance - 24 hour;Other (comment) (pt's son wanting LTC with hospice?)    Equipment Recommendations  None recommended by OT    Recommendations for Other Services      Precautions / Restrictions Precautions Precautions: Fall;Other (comment) Precaution Comments: [id number] diet and nectar thickened liquids Restrictions Weight Bearing Restrictions: No       Mobility Bed Mobility Overal bed mobility: Needs Assistance       Supine to sit: Min assist;HOB elevated     General bed mobility comments: min A to scoot hips forward using pad underneath pt    Transfers Overall transfer level: Needs assistance Equipment used: 4-wheeled walker Transfers: Sit to/from Stand Sit to Stand: Min assist;Min guard              Balance Overall balance assessment: Needs assistance Sitting-balance support: Feet supported;No upper extremity supported Sitting balance-Leahy  Scale: Fair   Postural control: Right lateral lean Standing balance support: Bilateral upper extremity supported;During functional activity Standing balance-Leahy Scale: Poor                             ADL either performed or assessed with clinical judgement   ADL Overall ADL's : Needs assistance/impaired     Grooming: Wash/dry hands;Wash/dry face;Standing;Cueing for safety;Cueing for sequencing;Min guard           Upper Body Dressing : Min guard;Sitting Upper Body Dressing Details (indicate cue type and reason): donned clean gown     Toilet Transfer: Minimal assistance;Min guard;Ambulation;RW   Toileting- Water quality scientist and Hygiene: Min guard;Sit to/from stand Toileting - Clothing Manipulation Details (indicate cue type and reason): verbal cues to use grab bar to steady herself     Functional mobility during ADLs: Minimal assistance;Min guard;Rolling walker;Cueing for safety;Cueing for sequencing       Vision Patient Visual Report: No change from baseline     Perception     Praxis      Cognition Arousal/Alertness: Awake/alert Behavior During Therapy: WFL for tasks assessed/performed Overall Cognitive Status: History of cognitive impairments - at baseline Area of Impairment: Orientation;Attention;Memory;Following commands;Safety/judgement;Awareness;Problem solving                 Orientation Level: Disoriented to;Place;Time;Situation   Memory: Decreased short-term memory Following Commands: Follows one step commands consistently Safety/Judgement: Decreased awareness of safety;Decreased awareness of deficits   Problem Solving: Slow processing;Decreased initiation;Difficulty sequencing;Requires verbal cues          Exercises     Shoulder Instructions  General Comments      Pertinent Vitals/ Pain       Pain Assessment: Faces Faces Pain Scale: Hurts a little bit Pain Location: in belly with movement Pain Descriptors /  Indicators: Grimacing;Sore Pain Intervention(s): Monitored during session;Repositioned  Home Living                                          Prior Functioning/Environment              Frequency  Min 2X/week        Progress Toward Goals  OT Goals(current goals can now be found in the care plan section)  Progress towards OT goals: Progressing toward goals  Acute Rehab OT Goals Patient Stated Goal: none stated  Plan Discharge plan remains appropriate    Co-evaluation                 AM-PAC OT "6 Clicks" Daily Activity     Outcome Measure   Help from another person eating meals?: None Help from another person taking care of personal grooming?: A Little Help from another person toileting, which includes using toliet, bedpan, or urinal?: A Little Help from another person bathing (including washing, rinsing, drying)?: A Lot Help from another person to put on and taking off regular upper body clothing?: A Little Help from another person to put on and taking off regular lower body clothing?: A Lot 6 Click Score: 17    End of Session Equipment Utilized During Treatment: Gait belt;Rolling walker  OT Visit Diagnosis: Unsteadiness on feet (R26.81);Pain;Other symptoms and signs involving cognitive function;History of falling (Z91.81) Pain - part of body:  (belly)   Activity Tolerance Patient tolerated treatment well   Patient Left with call bell/phone within reach;in chair;with chair alarm set   Nurse Communication          Time: 1884-1660 OT Time Calculation (min): 23 min  Charges: OT General Charges $OT Visit: 1 Visit OT Treatments $Self Care/Home Management : 8-22 mins $Therapeutic Activity: 8-22 mins     Britt Bottom 09/27/2020, 1:38 PM

## 2020-09-27 NOTE — Progress Notes (Signed)
Physical Therapy Treatment Patient Details Name: [name] MRN: [id number] DOB: 1940-12-21 Today's Date: 09/27/2020    History of Present Illness Pt is a 80 y.o. female who presented 09/18/20 with SOB, choking/coughing with eating and talking, weight loss. Of note, pt in ED 2 days PTA with a fall and c/o chest pain. CT chest demonstrated loculated right basilar effusion. S/p R chest tube placement 4/27, removed 5/3. PMH includes dementia, osteopenia, hyperthyroidism, COPD, CHF, cancer, arthritis, anxiety.    PT Comments    Pt limited by fatigue at this time, tolerating short household distances of mobility. Pt with significant cognitive deficits, incontinent of stool upon arrival and often forgetful of PT instruction within session. Pt does require physical assistance to mobilize in bed and assistance to maintain safety during all upright activity. Pt's cognitive deficits exacerbate her falls risk as she is less aware of her current mobility deficits. Pt will continue to benefit from acute PT services in an effort to improve activity tolerance and reduce caregiver burden.   Follow Up Recommendations  Other (comment) (per chart likely D/C w/ hospice. If not then ALF with HHPT services and assist for all out of bed mobility)     Equipment Recommendations  Rolling walker with 5" wheels    Recommendations for Other Services       Precautions / Restrictions Precautions Precautions: Fall Precaution Comments: [id number] diet and nectar thickened liquids Restrictions Weight Bearing Restrictions: No    Mobility  Bed Mobility Overal bed mobility: Needs Assistance Bed Mobility: Supine to Sit     Supine to sit: Min assist;HOB elevated     General bed mobility comments: hand hold to pull trunk into upright position    Transfers Overall transfer level: Needs assistance Equipment used: Rolling walker (2 wheeled) Transfers: Sit to/from Stand Sit to Stand: Min guard             Ambulation/Gait Ambulation/Gait assistance: Min guard Gait Distance (Feet): 20 Feet Assistive device: Rolling walker (2 wheeled) Gait Pattern/deviations: Step-to pattern;Trunk flexed Gait velocity: reduced Gait velocity interpretation: <1.31 ft/sec, indicative of household ambulator General Gait Details: pt with slowed step-to gait, increased trunk flexion, cues for turns   Marine scientist Rankin (Stroke Patients Only)       Balance Overall balance assessment: Needs assistance Sitting-balance support: No upper extremity supported;Feet supported Sitting balance-Leahy Scale: Poor Sitting balance - Comments: posterior LOB requiring UE support Postural control: Posterior lean Standing balance support: Single extremity supported;Bilateral upper extremity supported Standing balance-Leahy Scale: Poor Standing balance comment: Reliant on UE support                            Cognition Arousal/Alertness: Awake/alert Behavior During Therapy: WFL for tasks assessed/performed Overall Cognitive Status: History of cognitive impairments - at baseline Area of Impairment: Orientation;Attention;Memory;Following commands;Safety/judgement;Awareness;Problem solving                 Orientation Level: Disoriented to;Time;Situation Current Attention Level: Focused Memory: Decreased recall of precautions;Decreased short-term memory Following Commands: Follows one step commands with increased time Safety/Judgement: Decreased awareness of safety;Decreased awareness of deficits Awareness: Intellectual Problem Solving: Slow processing;Difficulty sequencing;Requires verbal cues        Exercises      General Comments General comments (skin integrity, edema, etc.): nasal canula laying next to pt upon PT arrival with 1L running from wall. Pt  sats at 87% with bed mobility on room air, PT places pt on 2L, and then 3L with improvement in  sats to mid-90s. Pt sats well on 3L [address] when mobilizing, weaned back to 1L [place] at end of session. Pt also noted to be incontinent of stool during session, assisted with hygiene tasks.      Pertinent Vitals/Pain Pain Assessment: No/denies pain Faces Pain Scale: Hurts a little bit Pain Location: in belly with movement Pain Descriptors / Indicators: Grimacing;Sore Pain Intervention(s): Monitored during session;Repositioned    Home Living                      Prior Function            PT Goals (current goals can now be found in the care plan section) Acute Rehab PT Goals Patient Stated Goal: none stated Progress towards PT goals: Not progressing toward goals - comment (limited by fatigue)    Frequency    Min 3X/week      PT Plan Current plan remains appropriate    Co-evaluation              AM-PAC PT "6 Clicks" Mobility   Outcome Measure  Help needed turning from your back to your side while in a flat bed without using bedrails?: A Little Help needed moving from lying on your back to sitting on the side of a flat bed without using bedrails?: A Little Help needed moving to and from a bed to a chair (including a wheelchair)?: A Little Help needed standing up from a chair using your arms (e.g., wheelchair or bedside chair)?: A Little Help needed to walk in hospital room?: A Little Help needed climbing 3-5 steps with a railing? : A Lot 6 Click Score: 17    End of Session Equipment Utilized During Treatment: Oxygen Activity Tolerance: Patient limited by fatigue Patient left: in chair;with call bell/phone within reach;with chair alarm set;with family/visitor present Nurse Communication: Mobility status PT Visit Diagnosis: Unsteadiness on feet (R26.81);Other abnormalities of gait and mobility (R26.89);Muscle weakness (generalized) (M62.81);History of falling (Z91.81)     Time: 1620-1650 PT Time Calculation (min) (ACUTE ONLY): 30 min  Charges:  $Therapeutic  Activity: 23-37 mins                     Zenaida Niece, PT, DPT Acute Rehabilitation Pager: [phone]    Zenaida Niece 09/27/2020, 5:09 PM

## 2020-09-27 NOTE — Progress Notes (Signed)
[facility] PROGRESS NOTE   [name] [id number] DOB: 1940-08-17 DOA: 09/18/2020  PCP: [name], MD  Brief History/Interval Summary: 80 y.o. female with medical history significant of COPD,  chronic systolic CHF [id number], hypothyroidism, breast cancer s/p left mastectomy presents from [facility] due to shortness of breath.  Patient with history of dementia and so history was limited.  Supposed to be on home oxygen at 2 L/min.  Presented with shortness of breath.  Was found to have a loculated right-sided pleural effusion.  Hospitalized for further management.  Patient has been medically optimized for DC and awaiting discharge dispo from family/TOC.  Consultants: Cardiothoracic surgery.  Interventional radiology.  Pulmonology  Procedures: Right-sided chest tube placement  Antibiotics: Anti-infectives (From admission, onward)   Start     Dose/Rate Route Frequency Ordered Stop   09/25/20 1400  amoxicillin-clavulanate (AUGMENTIN) 875-125 MG per tablet 1 tablet  Status:  Discontinued        1 tablet Oral Every 12 hours 09/25/20 1300 09/25/20 1312   09/25/20 1400  amoxicillin-clavulanate (AUGMENTIN) 500-125 MG per tablet 500 mg        1 tablet Oral Every 12 hours 09/25/20 1313 10/03/20 0959   09/25/20 0845  Ampicillin-Sulbactam (UNASYN) 3 g in sodium chloride 0.9 % 100 mL IVPB  Status:  Discontinued        3 g 200 mL/hr over 30 Minutes Intravenous Every 8 hours 09/25/20 0835 09/25/20 1300   09/25/20 0030  Ampicillin-Sulbactam (UNASYN) 3 g in sodium chloride 0.9 % 100 mL IVPB  Status:  Discontinued        3 g 200 mL/hr over 30 Minutes Intravenous Every 12 hours 09/24/20 1237 09/25/20 0835   09/22/20 1200  Ampicillin-Sulbactam (UNASYN) 3 g in sodium chloride 0.9 % 100 mL IVPB  Status:  Discontinued        3 g 200 mL/hr over 30 Minutes Intravenous Every 8 hours 09/22/20 1050 09/24/20 1237   09/19/20 1100  vancomycin (VANCOREADY) IVPB 750 mg/150 mL  Status:  Discontinued         750 mg 150 mL/hr over 60 Minutes Intravenous Every 24 hours 09/18/20 1255 09/21/20 1038   09/18/20 1600  cefTRIAXone (ROCEPHIN) 2 g in sodium chloride 0.9 % 100 mL IVPB  Status:  Discontinued        2 g 200 mL/hr over 30 Minutes Intravenous Every 24 hours 09/18/20 1526 09/22/20 1025   09/18/20 1600  azithromycin (ZITHROMAX) 500 mg in sodium chloride 0.9 % 250 mL IVPB  Status:  Discontinued        500 mg 250 mL/hr over 60 Minutes Intravenous Every 24 hours 09/18/20 1526 09/22/20 1025   09/18/20 0945  vancomycin (VANCOREADY) IVPB 1250 mg/250 mL        1,250 mg 166.7 mL/hr over 90 Minutes Intravenous  Once 09/18/20 0932 09/18/20 1334   09/18/20 0930  ceFEPIme (MAXIPIME) 2 g in sodium chloride 0.9 % 100 mL IVPB        2 g 200 mL/hr over 30 Minutes Intravenous  Once 09/18/20 0926 09/18/20 1043      Subjective/Interval History: Sitting up in bed eating breakfast.  Denies complaints.  "I am good".  Sister at bedside, has not seen her in 3 years, feels like she was on oxygen only at bedtime but is not sure.  Patient denies cough or pain issues.   Assessment/Plan:  Loculated right-sided pleural effusion/severe sepsis present on admission/acute on chronic respiratory failure with hypoxia Due to  loculated effusion. Cardiothoracic surgery was consulted.  Seen by Dr. Cyndia Bent.  Not thought to be a surgical candidate.  He recommended evaluation by IR.  Discussed with patient's son who was agreeable to procedures to drain this collection.  Patient underwent chest tube placement on 4/27. Gram stain did not show any organisms.  Total WBC in the pleural fluid was 1264.  Pleural fluid culture from 4/28: Negative, final report. Patient's antibiotic regimen was changed.  IV cefepime 4/26.  IV azithromycin, ceftriaxone 4/26/4/29, IV vancomycin 4/26/4/29, IV Unasyn 4/30- 5/3, Augmentin 5/3- 5/10.  Procalcitonin level improved to 0.49.   WBC had been improving and was down to 12.9 but then started rising  again.  Could be due to tPA per pulmonology notes.  Leukocytosis gradually improving.  Afebrile. Appreciate pulmonology assistance with management of chest tube.  She underwent dornase/tPA instillation per pulmonology.  Significant improvement in the pleural effusion noted on serial chest x-rays.  Chest tube discontinued 5/3. Discussed with nursing staff and they will continue to wean her down on her oxygen requirements as much as possible.  She may need home oxygen.  As per earlier notes, reportedly on 2 L/min home oxygen.  Discussed with RN at bedside who will try to contact patient's nursing facility for details. There was some concern for aspiration.  Patient was seen by speech therapy again on 5/4 and diet was downgraded to dysphagia 1 diet and nectar thickened liquids. Antibiotics changed to Augmentin to complete course 10/02/2020. Chest x-ray stable.  PCCM signed off 5/4.  Outpatient follow-up with Dr. Erin Fulling, PCCM.  Dysphagia Speech therapy reassessed patient: 5/4 and downgraded diet to dysphagia 1 diet and nectar thickened liquids.  Hypoglycemia CBG 50 along with blood glucose 51 on labs on 5/4 morning.  Treated per hypoglycemia protocol.  Likely due to poor oral intake.  Monitor closely and treat as needed.  No further hypoglycemic episodes over the last 24 hours.  Leukocytosis  Unclear if this is all only related to infectious etiology.  Presented with WBC as high as 47, neutrophilic.  Improving, down to 16.8.  Chest pain and dyspnea Symptoms likely due to her pleural effusion.  No ischemic findings noted on EKG.  Continue to treat symptomatically for now.  She is not a candidate for aggressive work-up.  No recurrence of chest pain.  Acute kidney injury Creatinine had peaked to 1.36, likely due to poor oral intake.  Briefly hydrated with IV fluids.  AKI resolved but remains at risk for recurrent dehydration and AKI if she has inconsistent oral intake  COPD with acute  exacerbation Seems to have improved with steroids and nebulizer treatments.  Acute exacerbation resolved  Mildly elevated troponin Likely due to demand ischemia.  EKG without any acute changes.  No angina.  Normocytic anemia Hemoglobin is stable in the 10 g range.  No evidence of overt blood loss.  Chronic systolic CHF Last echocardiogram showed a EF of 40 to 45%.  Clinically euvolemic.  Essential hypertension Continue home medications.  Monitor blood pressures.  Potassium abnormalities Initially hyperkalemic and then subsequently hypokalemic.  Supplement as needed.  Resolved.  History of dementia Patient apparently has significant dementia per family.  Reorient daily.  Delirium precautions.  Hypothyroidism TSH noted to be normal.  Continue levothyroxine.  History of breast cancer Seems to be stable.  Noted to be on anastrozole.  History of GERD Continue Pepcid.  Goals of care Palliative care is following and communicating with family.  At this time plan is  for patient to go to long-term care with hospice.  Patient's son lives in [place] and would prefer for the patient to be close to him.  TOC is following.    DVT Prophylaxis: Lovenox Code Status: DNR Family Communication: None at bedside today. Disposition Plan: Disposition to be determined.  PT and OT recommends home health.  Will likely need home oxygen as well.  Status is: Inpatient  Remains inpatient appropriate because:IV treatments appropriate due to intensity of illness or inability to take PO and Inpatient level of care appropriate due to severity of illness.  Current issues with dysphagia/choking and hypoglycemia this morning.   Dispo: The patient is from: Home              Anticipated d/c is to: LTC with hospice?  Locally in [place] versus [place] where son lives              Patient currently is medically optimized for discharge pending disposition decision by family/TOC input.   Difficult to place  patient No       Medications:  Scheduled: . amoxicillin-clavulanate  1 tablet Oral Q12H  . anastrozole  1 mg Oral Daily  . arformoterol  15 mcg Nebulization BID  . atorvastatin  40 mg Oral QHS  . budesonide (PULMICORT) nebulizer solution  0.5 mg Nebulization BID  . carvedilol  3.125 mg Oral BID WC  . docusate sodium  100 mg Oral BID  . enoxaparin (LOVENOX) injection  40 mg Subcutaneous Q24H  . famotidine  40 mg Oral QHS  . gabapentin  200 mg Oral BID  . levothyroxine  25 mcg Oral QAC breakfast  . memantine  10 mg Oral BID  . mirabegron ER  25 mg Oral Daily  . polyethylene glycol  17 g Oral Daily  . saccharomyces boulardii  250 mg Oral BID  . venlafaxine XR  150 mg Oral Q breakfast   Continuous:  TJQ:[id number], albuterol, ondansetron (ZOFRAN) IV, oxyCODONE, Resource ThickenUp Clear   Objective:  Vital Signs  Vitals:   09/27/20 0422 09/27/20 0801 09/27/20 0802 09/27/20 0858  BP: 129/66   130/65  Pulse: 84   90  Resp: 18   18  Temp: 97.7 F (36.5 C)   (!) 97.5 F (36.4 C)  TempSrc: Oral   Oral  SpO2: 100% 100% 100% 100%  Weight: 50.1 kg     Height:        Intake/Output Summary (Last 24 hours) at 09/27/2020 6333 Last data filed at 09/26/2020 1737 Gross per 24 hour  Intake 336 ml  Output --  Net 336 ml   Filed Weights   09/25/20 0415 09/26/20 0409 09/27/20 0422  Weight: 53.2 kg 51.4 kg 50.1 kg    General exam: Pleasant elderly female, moderately built and nourished sitting up comfortably in bed eating breakfast.  Appears to be in good spirits.  Sister at bedside. Respiratory system: Slightly diminished breath sounds in bases but otherwise clear to auscultation.  No increased work of breathing. Cardiovascular system: S1 and S2 heard, RRR.  No JVD, murmurs or pedal edema. Gastrointestinal system: Abdomen is nondistended, soft and nontender. No organomegaly or masses felt. Normal bowel sounds heard. Central nervous system: Alert and oriented only to self and  place. No focal neurological deficits. Extremities: Symmetric 5 x 5 power. Skin: No rashes, lesions or ulcers Psychiatry: Judgement and insight impaired. Mood & affect appropriate.       Lab Results:  Data Reviewed: I have personally reviewed following  labs and imaging studies  CBC: Recent Labs  Lab 09/23/20 0215 09/24/20 0750 09/25/20 0549 09/26/20 0448 09/27/20 0257  WBC 32.6* 21.6* 17.1* 18.4* 16.8*  NEUTROABS  --   --   --   --  13.6*  HGB 13.1 11.7* 10.2* 10.5* 10.5*  HCT 41.5 36.6 32.3* 31.9* 32.9*  MCV 92.6 91.5 92.0 89.4 90.9  PLT 350 338 322 325 027    Basic Metabolic Panel: Recent Labs  Lab 09/22/20 0202 09/23/20 0215 09/24/20 0750 09/25/20 0549 09/26/20 0448  NA 135 136 137 135 137  K 4.4 3.7 4.5 3.9 3.6  CL 98 98 98 99 101  CO2 24 27 27 29 28   GLUCOSE 121* 173* 99 87 51*  BUN 13 23 31* 25* 20  CREATININE 0.66 0.97 1.36* 1.10* 1.00  CALCIUM 8.3* 8.5* 8.6* 8.1* 8.1*    GFR: Estimated Creatinine Clearance: 36.1 mL/min (by C-G formula based on SCr of 1 mg/dL).     Recent Results (from the past 240 hour(s))  Resp Panel by RT-PCR (Flu A&B, Covid) Nasopharyngeal Swab     Status: None   Collection Time: 09/18/20  8:00 AM   Specimen: Nasopharyngeal Swab; Nasopharyngeal(NP) swabs in vial transport medium  Result Value Ref Range Status   SARS Coronavirus 2 by RT PCR NEGATIVE NEGATIVE Final    Comment: (NOTE) SARS-CoV-2 target nucleic acids are NOT DETECTED.  The SARS-CoV-2 RNA is generally detectable in upper respiratory specimens during the acute phase of infection. The lowest concentration of SARS-CoV-2 viral copies this assay can detect is 138 copies/mL. A negative result does not preclude SARS-Cov-2 infection and should not be used as the sole basis for treatment or other patient management decisions. A negative result may occur with  improper specimen collection/handling, submission of specimen other than nasopharyngeal swab, presence of  viral mutation(s) within the areas targeted by this assay, and inadequate number of viral copies(<138 copies/mL). A negative result must be combined with clinical observations, patient history, and epidemiological information. The expected result is Negative.  Fact Sheet for Patients:  EntrepreneurPulse.com.au  Fact Sheet for Healthcare Providers:  IncredibleEmployment.be  This test is no t yet approved or cleared by the [place] FDA and  has been authorized for detection and/or diagnosis of SARS-CoV-2 by FDA under an Emergency Use Authorization (EUA). This EUA will remain  in effect (meaning this test can be used) for the duration of the COVID-19 declaration under Section 564(b)(1) of the Act, 21 U.S.C.section 360bbb-3(b)(1), unless the authorization is terminated  or revoked sooner.       Influenza A by PCR NEGATIVE NEGATIVE Final   Influenza B by PCR NEGATIVE NEGATIVE Final    Comment: (NOTE) The Xpert Xpress SARS-CoV-2/FLU/RSV plus assay is intended as an aid in the diagnosis of influenza from Nasopharyngeal swab specimens and should not be used as a sole basis for treatment. Nasal washings and aspirates are unacceptable for Xpert Xpress SARS-CoV-2/FLU/RSV testing.  Fact Sheet for Patients: EntrepreneurPulse.com.au  Fact Sheet for Healthcare Providers: IncredibleEmployment.be  This test is not yet approved or cleared by the [place] FDA and has been authorized for detection and/or diagnosis of SARS-CoV-2 by FDA under an Emergency Use Authorization (EUA). This EUA will remain in effect (meaning this test can be used) for the duration of the COVID-19 declaration under Section 564(b)(1) of the Act, 21 U.S.C. section 360bbb-3(b)(1), unless the authorization is terminated or revoked.  Performed at [facility], [place] [address] [place], [place] [address]  Blood culture (routine x 2)      Status: None   Collection Time: 09/18/20  9:22 AM   Specimen: BLOOD  Result Value Ref Range Status   Specimen Description BLOOD BLOOD RIGHT FOREARM  Final   Special Requests   Final    BOTTLES DRAWN AEROBIC AND ANAEROBIC Blood Culture results may not be optimal due to an inadequate volume of blood received in culture bottles   Culture   Final    NO GROWTH 5 DAYS Performed at [facility], [place] [address], [place], [place] [address]    Report Status 09/23/2020 FINAL  Final  Blood culture (routine x 2)     Status: None   Collection Time: 09/18/20  9:27 AM   Specimen: BLOOD  Result Value Ref Range Status   Specimen Description BLOOD RIGHT ANTECUBITAL  Final   Special Requests   Final    BOTTLES DRAWN AEROBIC AND ANAEROBIC Blood Culture results may not be optimal due to an inadequate volume of blood received in culture bottles   Culture   Final    NO GROWTH 5 DAYS Performed at [facility], [place] [address], [place], [place] [address]    Report Status 09/23/2020 FINAL  Final  Culture, body fluid w Gram Stain-bottle     Status: None   Collection Time: 09/19/20  4:46 PM   Specimen: Fluid  Result Value Ref Range Status   Specimen Description FLUID PLEURAL RIGHT  Final   Special Requests BOTTLES DRAWN AEROBIC AND ANAEROBIC  Final   Culture   Final    NO GROWTH 5 DAYS Performed at [facility], [place] [address] [place], [place] [address]    Report Status 09/24/2020 FINAL  Final  Gram stain     Status: None   Collection Time: 09/19/20  4:46 PM   Specimen: Fluid  Result Value Ref Range Status   Specimen Description FLUID PLEURAL RIGHT  Final   Special Requests NONE  Final   Gram Stain   Final    RARE WBC PRESENT, PREDOMINANTLY PMN NO ORGANISMS SEEN Performed at [facility], [place] [address] [place], [place] [address]    Report Status 09/20/2020 FINAL  Final  MRSA PCR Screening     Status: None   Collection Time: 09/20/20  4:30 AM   Specimen: Nasal Mucosa;  Nasopharyngeal  Result Value Ref Range Status   MRSA by PCR NEGATIVE NEGATIVE Final    Comment:        The GeneXpert MRSA Assay (FDA approved for NASAL specimens only), is one component of a comprehensive MRSA colonization surveillance program. It is not intended to diagnose MRSA infection nor to guide or monitor treatment for MRSA infections. Performed at [facility], [place] [address], [place], [place] [address]   Urine Culture     Status: Abnormal   Collection Time: 09/23/20  6:56 PM   Specimen: Urine, Random  Result Value Ref Range Status   Specimen Description URINE, RANDOM  Final   Special Requests   Final    Unasyn Normal Performed at [facility], [place] [address] [place], [place] [address]    Culture 10,000 COLONIES/mL YEAST (A)  Final   Report Status 09/25/2020 FINAL  Final      Radiology Studies: DG Chest Port 1 View  Result Date: 09/26/2020 CLINICAL DATA:  Shortness of breath.  Chest tube removal. EXAM: PORTABLE CHEST 1 VIEW COMPARISON:  09/25/2020. FINDINGS: Mediastinum  and hilar structures normal. Mild right base atelectasis/infiltrate. No pleural effusion or pneumothorax. Stable mild left apical pleural thickening consistent scarring. Heart size normal. No acute bony abnormality. IMPRESSION: Mild right base atelectasis/infiltrate.  No pneumothorax. Electronically Signed   By: Marcello Moores  Register   On: 09/26/2020 07:20   DG Chest Port 1 View  Result Date: 09/25/2020 CLINICAL DATA:  Chest tube removal. EXAM: PORTABLE CHEST 1 VIEW COMPARISON:  No prior. FINDINGS: Mediastinum and hilar structures normal. Mild atelectasis/infiltrate right lung base. No pleural effusion or pneumothorax. Oral contrast in the colon. Heart size normal. Degenerative changes scoliosis thoracic spine. IMPRESSION: Mild atelectasis right lung base.  No evidence of pneumothorax. Electronically Signed   By: Marcello Moores  Register   On: 09/25/2020 15:25   DG Chest Port 1 View  Result Date:  09/25/2020 CLINICAL DATA:  Status post chest tube removal today. EXAM: PORTABLE CHEST 1 VIEW COMPARISON:  Single-view of the chest earlier today. FINDINGS: Pigtail catheter in the right chest has been removed. No pneumothorax. Mild right basilar opacities are unchanged. The left lung is expanded and clear. Heart size is normal. Aortic atherosclerosis. IMPRESSION: Negative for pneumothorax after right chest tube removal. Electronically Signed   By: Inge Rise M.D.   On: 09/25/2020 13:20       LOS: 9 days   Vernell Leep, MD, [place], [facility]. [facility]  To contact the attending provider between 7A-7P or the covering provider during after hours 7P-7A, please log into the web site [email or web address] and access using universal [place] password for that web site. If you do not have the password, please call the hospital operator.  09/27/2020, 9:07 AM

## 2020-09-28 DIAGNOSIS — R0602 Shortness of breath: Secondary | ICD-10-CM

## 2020-09-28 LAB — CBC
HCT: 31.9 % — ABNORMAL LOW (ref 36.0–46.0)
Hemoglobin: 10.3 g/dL — ABNORMAL LOW (ref 12.0–15.0)
MCH: 29.2 pg (ref 26.0–34.0)
MCHC: 32.3 g/dL (ref 30.0–36.0)
MCV: 90.4 fL (ref 80.0–100.0)
Platelets: 298 10*3/uL (ref 150–400)
RBC: 3.53 MIL/uL — ABNORMAL LOW (ref 3.87–5.11)
RDW: 13.2 % (ref 11.5–15.5)
WBC: 16 10*3/uL — ABNORMAL HIGH (ref 4.0–10.5)
nRBC: 0 % (ref 0.0–0.2)

## 2020-09-28 LAB — BASIC METABOLIC PANEL
Anion gap: 6 (ref 5–15)
BUN: 8 mg/dL (ref 8–23)
CO2: 32 mmol/L (ref 22–32)
Calcium: 8 mg/dL — ABNORMAL LOW (ref 8.9–10.3)
Chloride: 98 mmol/L (ref 98–111)
Creatinine, Ser: 0.8 mg/dL (ref 0.44–1.00)
GFR, Estimated: >60 mL/min (ref >60)
Glucose, Bld: 75 mg/dL (ref 70–99)
Potassium: 3.3 mmol/L — ABNORMAL LOW (ref 3.5–5.1)
Sodium: 136 mmol/L (ref 135–145)

## 2020-09-28 LAB — GLUCOSE, CAPILLARY
Glucose-Capillary: 100 mg/dL — ABNORMAL HIGH (ref 70–99)
Glucose-Capillary: 81 mg/dL (ref 70–99)
Glucose-Capillary: 135 mg/dL — ABNORMAL HIGH (ref 70–99)

## 2020-09-28 MED ORDER — POTASSIUM CHLORIDE 20 MEQ PO PACK
40.0000 meq | PACK | Freq: Once | ORAL | Status: AC
  Administered 2020-09-28: 40 meq via ORAL
  Filled 2020-09-28: qty 2

## 2020-09-28 NOTE — Progress Notes (Signed)
[facility]  PROGRESS NOTE  [name] FUX:[id number] DOB: [birth year] DOA: 09/18/2020 PCP: [name], MD   Brief HPI:   [age] year old female with medical history of COPD, chronic systolic CHF 40 to 02%, hypothyroidism, breast cancer s/p left mastectomy presented from [facility] due to shortness of breath.  Patient has history of dementia so history was limited.  She was supposed to be on home oxygen at 2 L/min.  Presented with shortness of breath.  Found to have loculated right-sided pleural effusion.  Hospitalist for further management.    Subjective   Patient seen and examined, denies any shortness of breath.   Assessment/Plan:     1. Loculated right-sided pleural effusion-severe sepsis present on admission, acute on chronic respiratory failure with hypoxemia.  Patient presented with loculated effusion, CT surgery was consulted.  She was felt not to be a surgical candidate.  IR was consulted for chest tube placement.  Patient underwent chest tube placement on 4/27.  Gram stain did not show any organism and pleural fluid, total WBC and pleural fluid was 1264.  Pleural fluid culture from 4/28 was negative.  Patient antibiotic was changed. IV cefepime 4/26.  IV azithromycin, ceftriaxone 4/26/4/29, IV vancomycin 4/26/4/29, IV Unasyn 4/30- 5/3, Augmentin 5/3- 5/10.  Procalcitonin level improved to 0.49.    WBC still elevated at 16,000.  Pulmonology was consulted for management of chest tube.  She underwent dornase/tPA instillation per pulmonology.  Significant improvement in the pleural effusion noted on serial chest x-rays.  Chest tube was discontinued on 09/25/2020.  Currently she is on 2 L/min oxygen.  We will try to wean off oxygen, otherwise we will send patient home on 2 L/min.  There was concern for dysphagia, patient was seen by speech therapy on 5/4 and diet was downgraded to dysphagia 1 diet with nectar thick liquids.  Antibiotics changed to Augmentin to complete course until  10/02/2020.  Chest x-ray stable.  PCCM signed off on 5//22.  Outpatient follow-up with Dr. Erin Fulling PCCM. 2. Dysphagia-was being seen by speech therapy, diet changed to dysphagia 1 diet on 5/4 with nectar thick liquids. 3. Hypoglycemia-resolved, CBG has been on low side.  We will continue to monitor CBG. 4. Leukocytosis-unclear if it is due to infectious etiology, she presented with WBC of 47,000, it has improved to 16,000. 5. Chest pain dyspnea-likely from pleural effusion.  No ischemic changes noted on EKG.  She is not a candidate for aggressive work-up.  No recurrence of chest pain. 6. Acute kidney injury-creatinine peaked to 1.36, due to poor p.o. intake.  She was given hydration with IV fluids.  Creatinine is down to 0.8. 7. Elevated troponin-mild, likely from demand ischemia.  No anginal symptoms. 8. Normocytic anemia-globin is stable at 10 g range. 9. Chronic systolic CHF-last echocardiogram showed EF of 40 to 45%.  Currently euvolemic. 10. Hypertension-continue home medications 11. Hypothyroidism-continue Synthroid 12. Hypokalemia-potassium is 3.3, will replace potassium and follow BMP in am. 13. History of dementia-stable, no behavioral disturbance 14. History of breast cancer-continue anastrozole 15. History of GERD-continue Pepcid 16. Goals of care-palliative care has been consulted, at this time plan is to go long-term care with hospice.  Patient's son lives in [place] and would prefer patient with close to him.  TOC is following   Scheduled medications:   . amoxicillin-clavulanate  1 tablet Oral Q12H  . anastrozole  1 mg Oral Daily  . arformoterol  15 mcg Nebulization BID  . atorvastatin  40 mg Oral QHS  .  budesonide (PULMICORT) nebulizer solution  0.5 mg Nebulization BID  . carvedilol  3.125 mg Oral BID WC  . docusate sodium  100 mg Oral BID  . enoxaparin (LOVENOX) injection  40 mg Subcutaneous Q24H  . famotidine  40 mg Oral QHS  . gabapentin  200 mg Oral BID  . levothyroxine   25 mcg Oral QAC breakfast  . memantine  10 mg Oral BID  . mirabegron ER  25 mg Oral Daily  . polyethylene glycol  17 g Oral Daily  . saccharomyces boulardii  250 mg Oral BID  . venlafaxine XR  150 mg Oral Q breakfast         Data Reviewed:   CBG:  Recent Labs  Lab 09/27/20 1119 09/27/20 1618 09/27/20 2235 09/28/20 0107 09/28/20 0407  GLUCAP 117* 94 102* 100* 81    SpO2: 100 % O2 Flow Rate (L/min): 2 L/min FiO2 (%): 32 %    Vitals:   09/28/20 0733 09/28/20 0734 09/28/20 0800 09/28/20 1120  BP:    115/62  Pulse:    86  Resp:   18 18  Temp:    98.4 F (36.9 C)  TempSrc:    Oral  SpO2: 99% 99%  100%  Weight:      Height:         Intake/Output Summary (Last 24 hours) at 09/28/2020 1715 Last data filed at 09/28/2020 1259 Gross per 24 hour  Intake 674 ml  Output 400 ml  Net 274 ml    05/04 1901 - 05/06 0700 In: 320 [P.O.:320] Out: 400 [Urine:400]  Filed Weights   09/26/20 0409 09/27/20 0422 09/28/20 0349  Weight: 51.4 kg 50.1 kg 51.2 kg    CBC:  Recent Labs  Lab 09/24/20 0750 09/25/20 0549 09/26/20 0448 09/27/20 0257 09/28/20 0435  WBC 21.6* 17.1* 18.4* 16.8* 16.0*  HGB 11.7* 10.2* 10.5* 10.5* 10.3*  HCT 36.6 32.3* 31.9* 32.9* 31.9*  PLT 338 322 325 329 298  MCV 91.5 92.0 89.4 90.9 90.4  MCH 29.3 29.1 29.4 29.0 29.2  MCHC 32.0 31.6 32.9 31.9 32.3  RDW 13.2 13.2 13.1 13.2 13.2  LYMPHSABS  --   --   --  1.5  --   MONOABS  --   --   --  1.2*  --   EOSABS  --   --   --  0.2  --   BASOSABS  --   --   --  0.0  --     Complete metabolic panel:  Recent Labs  Lab 09/23/20 0215 09/24/20 0750 09/25/20 0549 09/26/20 0448 09/28/20 0435  NA 136 137 135 137 136  K 3.7 4.5 3.9 3.6 3.3*  CL 98 98 99 101 98  CO2 27 27 29 28  32  GLUCOSE 173* 99 87 51* 75  BUN 23 31* 25* 20 8  CREATININE 0.97 1.36* 1.10* 1.00 0.80  CALCIUM 8.5* 8.6* 8.1* 8.1* 8.0*    No results for input(s): LIPASE, AMYLASE in the last 168 hours.  No results for input(s):  CRP, DDIMER, BNP, PROCALCITON, SARSCOV2NAA in the last 168 hours.  Invalid input(s): LACTICACID  ------------------------------------------------------------------------------------------------------------------ No results for input(s): CHOL, HDL, LDLCALC, TRIG, CHOLHDL, LDLDIRECT in the last 72 hours.  No results found for: HGBA1C ------------------------------------------------------------------------------------------------------------------ No results for input(s): TSH, T4TOTAL, T3FREE, THYROIDAB in the last 72 hours.  Invalid input(s): FREET3 ------------------------------------------------------------------------------------------------------------------ No results for input(s): VITAMINB12, FOLATE, FERRITIN, TIBC, IRON, RETICCTPCT in the last 72 hours.  Coagulation profile No results for  input(s): INR, PROTIME in the last 168 hours. No results for input(s): DDIMER in the last 72 hours.  Cardiac Enzymes No results for input(s): CKTOTAL, CKMB, CKMBINDEX, TROPONINI in the last 168 hours.  ------------------------------------------------------------------------------------------------------------------    Component Value Date/Time   BNP 296.9 (H) 09/18/2020 0802     Antibiotics: Anti-infectives (From admission, onward)   Start     Dose/Rate Route Frequency Ordered Stop   09/25/20 1400  amoxicillin-clavulanate (AUGMENTIN) 875-125 MG per tablet 1 tablet  Status:  Discontinued        1 tablet Oral Every 12 hours 09/25/20 1300 09/25/20 1312   09/25/20 1400  amoxicillin-clavulanate (AUGMENTIN) 500-125 MG per tablet 500 mg        1 tablet Oral Every 12 hours 09/25/20 1313 10/03/20 0959   09/25/20 0845  Ampicillin-Sulbactam (UNASYN) 3 g in sodium chloride 0.9 % 100 mL IVPB  Status:  Discontinued        3 g 200 mL/hr over 30 Minutes Intravenous Every 8 hours 09/25/20 0835 09/25/20 1300   09/25/20 0030  Ampicillin-Sulbactam (UNASYN) 3 g in sodium chloride 0.9 % 100 mL IVPB  Status:   Discontinued        3 g 200 mL/hr over 30 Minutes Intravenous Every 12 hours 09/24/20 1237 09/25/20 0835   09/22/20 1200  Ampicillin-Sulbactam (UNASYN) 3 g in sodium chloride 0.9 % 100 mL IVPB  Status:  Discontinued        3 g 200 mL/hr over 30 Minutes Intravenous Every 8 hours 09/22/20 1050 09/24/20 1237   09/19/20 1100  vancomycin (VANCOREADY) IVPB 750 mg/150 mL  Status:  Discontinued        750 mg 150 mL/hr over 60 Minutes Intravenous Every 24 hours 09/18/20 1255 09/21/20 1038   09/18/20 1600  cefTRIAXone (ROCEPHIN) 2 g in sodium chloride 0.9 % 100 mL IVPB  Status:  Discontinued        2 g 200 mL/hr over 30 Minutes Intravenous Every 24 hours 09/18/20 1526 09/22/20 1025   09/18/20 1600  azithromycin (ZITHROMAX) 500 mg in sodium chloride 0.9 % 250 mL IVPB  Status:  Discontinued        500 mg 250 mL/hr over 60 Minutes Intravenous Every 24 hours 09/18/20 1526 09/22/20 1025   09/18/20 0945  vancomycin (VANCOREADY) IVPB 1250 mg/250 mL        1,250 mg 166.7 mL/hr over 90 Minutes Intravenous  Once 09/18/20 0932 09/18/20 1334   09/18/20 0930  ceFEPIme (MAXIPIME) 2 g in sodium chloride 0.9 % 100 mL IVPB        2 g 200 mL/hr over 30 Minutes Intravenous  Once 09/18/20 0926 09/18/20 1043       Radiology Reports  No results found.    DVT prophylaxis: Lovenox  Code Status: DNR  Family Communication: No family at bedside   Consultants:  Cardiothoracic surgery  Interventional neurology  Pulmonology  Procedures:  Right-sided chest tube placement    Objective    Physical Examination:    General-appears in no acute distress  Heart-S1-S2, regular, no murmur auscultated  Lungs-clear to auscultation bilaterally, no wheezing or crackles auscultated  Abdomen-soft, nontender, no organomegaly  Extremities-no edema in the lower extremities  Neuro-alert, oriented x3, no focal deficit noted   Status is: Inpatient  Dispo: The patient is from: Home               Anticipated d/c is to: LTC with hospice  Anticipated d/c date is: 10/01/2020              Patient currently not stable for discharge  Barrier to discharge-none  COVID-19 Labs  No results for input(s): DDIMER, FERRITIN, LDH, CRP in the last 72 hours.  Lab Results  Component Value Date   [place] NEGATIVE 09/18/2020    Microbiology  Recent Results (from the past 240 hour(s))  Culture, body fluid w Gram Stain-bottle     Status: None   Collection Time: 09/19/20  4:46 PM   Specimen: Fluid  Result Value Ref Range Status   Specimen Description FLUID PLEURAL RIGHT  Final   Special Requests BOTTLES DRAWN AEROBIC AND ANAEROBIC  Final   Culture   Final    NO GROWTH 5 DAYS Performed at [facility], [address], [place], [place] [address]    Report Status 09/24/2020 FINAL  Final  Gram stain     Status: None   Collection Time: 09/19/20  4:46 PM   Specimen: Fluid  Result Value Ref Range Status   Specimen Description FLUID PLEURAL RIGHT  Final   Special Requests NONE  Final   Gram Stain   Final    RARE WBC PRESENT, PREDOMINANTLY PMN NO ORGANISMS SEEN Performed at [facility], [place] [address] [place], [place] [address]    Report Status 09/20/2020 FINAL  Final  MRSA PCR Screening     Status: None   Collection Time: 09/20/20  4:30 AM   Specimen: Nasal Mucosa; Nasopharyngeal  Result Value Ref Range Status   MRSA by PCR NEGATIVE NEGATIVE Final    Comment:        The GeneXpert MRSA Assay (FDA approved for NASAL specimens only), is one component of a comprehensive MRSA colonization surveillance program. It is not intended to diagnose MRSA infection nor to guide or monitor treatment for MRSA infections. Performed at [facility], [place] [address] [place], [place] [address]   Urine Culture     Status: Abnormal   Collection Time: 09/23/20  6:56 PM   Specimen: Urine, Random  Result Value Ref Range Status   Specimen Description URINE, RANDOM  Final    Special Requests   Final    Unasyn Normal Performed at [facility], [place] [address], [place], [place] [address]    Culture 10,000 COLONIES/mL YEAST (A)  Final   Report Status 09/25/2020 FINAL  Final             Oswald Hillock   [facility] If 7PM-7AM, please contact night-coverage at [email or web address], Office  [phone]   09/28/2020, 5:15 PM  LOS: 10 days

## 2020-09-28 NOTE — Progress Notes (Signed)
Daily Progress Note   Patient Name: [name]       Date: 09/28/2020 DOB: [birth year]  Age: 80 y.o. MRN#: [id number] Attending Physician: Oswald Hillock, MD Primary Care Physician: [name], MD Admit Date: 09/18/2020  Reason for Consultation/Follow-up: goals of care  Subjective: 16:45--Patient is alert, denies pain or shortness of breath at present time. Patient's sister [name] is visiting from [place] and is at bedside. She expresses concern that patient became very fatigued during the PT session earlier today and inquires if she will benefit from continued PT. I provide counseling that if the goal of care is comfort, then PT is usually not continued.   17:00--I spoke with son [name] and daughter-in-law [name] by phone. Confirmed that goal is for patient to return to [facility] to a level of care where she can receive 24/7 supervision. Also confirmed that they want her to receive hospice services on discharge. Discussed that she will not receive additional PT once she is enrolled in hospice care - family verbalizes understanding.  [name] expresses concern about the possibility of recurrent fluid in her lungs and inquires whether hospice can manage this with a comfort care approach. I provided reassurance that hospice will review her medical records to understand her underlying conditions, and will be able to appropriately manage any symptoms of pain and/or dyspnea to ensure comfort in the setting of end-stage illness.   Length of Stay: 10  Current Medications: Scheduled Meds:  . amoxicillin-clavulanate  1 tablet Oral Q12H  . anastrozole  1 mg Oral Daily  . arformoterol  15 mcg Nebulization BID  . atorvastatin  40 mg Oral QHS  . budesonide (PULMICORT) nebulizer solution  0.5 mg  Nebulization BID  . carvedilol  3.125 mg Oral BID WC  . docusate sodium  100 mg Oral BID  . enoxaparin (LOVENOX) injection  40 mg Subcutaneous Q24H  . famotidine  40 mg Oral QHS  . gabapentin  200 mg Oral BID  . levothyroxine  25 mcg Oral QAC breakfast  . memantine  10 mg Oral BID  . mirabegron ER  25 mg Oral Daily  . polyethylene glycol  17 g Oral Daily  . saccharomyces boulardii  250 mg Oral BID  . venlafaxine XR  150 mg Oral Q breakfast     PRN Meds: acetaminophen,  albuterol, ondansetron (ZOFRAN) IV, oxyCODONE, Resource ThickenUp Clear  Physical Exam Constitutional:      General: She is not in acute distress.    Appearance: She is ill-appearing.  Cardiovascular:     Rate and Rhythm: Normal rate and regular rhythm.  Pulmonary:     Effort: Pulmonary effort is normal.  Neurological:     Mental Status: She is alert.     Motor: Weakness present.  Psychiatric:        Cognition and Memory: Cognition is impaired.             Vital Signs: BP 115/62 (BP Location: Right Arm)   Pulse 86   Temp 98.4 F (36.9 C) (Oral)   Resp 18   Ht 5\' 2"  (1.575 m)   Wt 51.2 kg   SpO2 100%   BMI 20.65 kg/m  SpO2: SpO2: 100 % O2 Device: O2 Device: Nasal Cannula O2 Flow Rate: O2 Flow Rate (L/min): 2 L/min  Intake/output summary:   Intake/Output Summary (Last 24 hours) at 09/28/2020 1653 Last data filed at 09/28/2020 1259 Gross per 24 hour  Intake 674 ml  Output 400 ml  Net 274 ml   LBM: Last BM Date: 09/26/20 Baseline Weight: Weight: 55 kg Most recent weight: Weight: 51.2 kg       Palliative Assessment/Data: PPS 30-40%      Patient Active Problem List   Diagnosis Date Noted  . Pleural effusion   . Empyema lung ([place]) 09/18/2020  . Sepsis with acute respiratory failure ([place]) 09/18/2020  . Thrombocytosis 09/18/2020  . Leukocytosis 09/18/2020  . Elevated troponin 09/18/2020  . Dementia ([place]) 09/18/2020  . COPD with acute exacerbation ([place]) 01/30/2016  . Cough 01/30/2016  .  Breast cancer of lower-inner quadrant of left female breast ([place]) 01/29/2016  . GI bleed 09/26/2015  . Acute GI bleeding 09/26/2015  . Chest tube in place 09/26/2015  . HLD (hyperlipidemia) 09/26/2015  . GI bleeding 09/26/2015  . Acute pulmonary embolism ([place]) 07/13/2013  . Acute renal failure ([place]) 07/08/2013  . Metabolic acidosis 28/41/3244  . Hypokalemia 07/08/2013  . Anemia 07/08/2013  . History of breast cancer 07/08/2013  . CHF (congestive heart failure) ([place]) 07/08/2013  . Diarrhea 07/08/2013  . Renal failure 07/07/2013    Palliative Care Assessment & Plan   Patient Profile: 80 y.o.femalewith past medical history of COPD, systolic CHF (EF 01-02%), hypothyroidism, dementia, and breast cancer s/p left mastectomy who presented to the emergency departmenton 4/26/2022with shortness of breath.At baseline, she wears oxygen 2L at night. Family reporting that patient was getting choked up and coughing while eating and talking. ED Course: Required NRB mask to maintain O2 saturations. WBC 47.1, tachycradia, and tachypnea. Chest x-ray showed right lung base infiltrate with moderate right-sided pleural effusion.  Admitted to [facility] with sepsis secondary to right empyema and acute hypoxic respiratory failure. Right chest tube placed 4/27.  Assessment: loculated right pleural effusion Chest pain and dyspnea COPD with acute exacerbation Chronic systolic CHF Dementia History of breast cancer  Recommendations/Plan: DNR/DNI as previously documented Continue current medical care Goal of care is to return to [facility] with 24/7 supervision (staff has verbalized to son they can provide this) Family requests hospice services at discharge - preference is [facility] - TOC order placed PMT will continue to follow  Goals of Care and Additional Recommendations: Limitations on Scope of Treatment: no artificial feeding, no tracheostomy  Code  Status: DNR/DNI  Prognosis: Less than 6 months  Discharge Planning:  Skilled nursing versus AL with hospice services  Care plan was discussed with TOC  Thank you for allowing the Palliative Medicine Team to assist in the care of this patient.   Total Time 35 minutes Prolonged Time Billed  no      Greater than 50%  of this time was spent counseling and coordinating care related to the above assessment and plan.  Lavena Bullion, NP  Please contact Palliative Medicine Team phone at [phone] for questions and concerns.

## 2020-09-28 NOTE — TOC Progression Note (Addendum)
Transition of Care [facility]) - Progression Note    Patient Details  Name: Tien Aispuro MRN: [id number] Date of Birth: Mar 09, 1941  Transition of Care [facility]) CM/SW Contact  Reece Agar, [place] Phone Number: 09/28/2020, 12:11 PM  Clinical Narrative:    CSW spoke with pt son on 09/27/2020, son is still not sure where he wants to send his mother for LTC. CSW explained that he did have a couple of offers for SNF LTC and in ordered to use LTC insurance he would have to pay up front for the first 30 days in which he was agreeable. CSW explained that [facility] willing to accept pt and explained the insurance process. CSW spoke with son again and suggested filling out application to medicaid to get LTC without have to pay monthly. He will start application and CSW will follow up with [facility] to check for any assistance they may be able to offer for LTC, there was no answer CSW will follow up.  12:00pm CSW spoke with [facility] and they confirmed that they are not able to provide any 24/7 care and she may need LTC SNF, CSW followed up and contacted pt son to provide update and reached out to Dr. requesting speak with son about his mothers progress. CSW spoke with Opal Sidles at [facility] ad she stated they had a bed yesterday but can no longer take any pt's bc of staffing issues right now. CSW is following up with other SNF that may take pt for LTC.  12:43pm- CSW spoke with pt son again and he provided the names of Jackelyn Poling and Ivin Booty at [facility] who he spoke with who said they can provide 24/7 memory care for pt. CSW contacted facility to speak with Ivin Booty or Jackelyn Poling neither was available but receptionist agreed to  give Ivin Booty CSW contact info provided to follow up. CSW spoke with Ivin Booty and faxed over clinicals, Ivin Booty will contaced CSW tomorrow with follow up with decision of care for pt.        Expected Discharge Plan and Services                                                  Social Determinants of Health (SDOH) Interventions    Readmission Risk Interventions No flowsheet data found.

## 2020-09-28 NOTE — Care Management Important Message (Signed)
Important Message  Patient Details  Name: [name] MRN: [id number] Date of Birth: 02/04/1941   Medicare Important Message Given:  Yes     Shelda Altes 09/28/2020, 8:47 AM

## 2020-09-28 NOTE — Progress Notes (Signed)
  Speech Language Pathology Treatment: Dysphagia  Patient Details Name: [name] MRN: [id number] DOB: [birth year] Today's Date: 09/28/2020 Time: 3716-9678 SLP Time Calculation (min) (ACUTE ONLY): 11 min  Assessment / Plan / Recommendation Clinical Impression  Upon SLP arrival, pt was sitting upright in bed eating her breakfast. She mostly consumed the pureed foods on her tray, taking fairly small bites independently. When asked to drink her nectar thick liquids she does so in large, consecutive sips even with SLP cueing for smaller ones. Despite this, no overt s/s of aspiration are noted across consistencies. Recommend to continue with current diet for now in an attempt to reduce risk as much as possible, although note that with comfort feedings, pt could be liberalized per family preference. Will continue to follow.   HPI HPI: Pt is a 80 yo female presenting with SOB, admitted with sepsis secondary to R empyema with respiratory failure. CXR concerning for R-sided PNA with moderate pleural effusion. Family reported coughing while eating and talking. PMH includes: dementia, COPD, systolic CHF, hypothyroidism, breast ca s/p L mastectomy, recent fall      SLP Plan          Recommendations  Diet recommendations: Dysphagia 1 (puree);Nectar-thick liquid Liquids provided via: Cup;Straw Medication Administration: Crushed with puree Supervision: Patient able to self feed;Full supervision/cueing for compensatory strategies Compensations: Minimize environmental distractions;Slow rate;Small sips/bites;Clear throat intermittently Postural Changes and/or Swallow Maneuvers: Seated upright 90 degrees;Upright 30-60 min after meal                Oral Care Recommendations: Oral care QID Follow up Recommendations: Other (comment) (LTC with hospice) SLP Visit Diagnosis: Dysphagia, oropharyngeal phase (R13.12)       GO                Osie Bond., M.A. [facility] Pager [phone] Office [phone]  09/28/2020, 9:31 AM

## 2020-09-29 LAB — CBC
HCT: 33.6 % — ABNORMAL LOW (ref 36.0–46.0)
Hemoglobin: 10.7 g/dL — ABNORMAL LOW (ref 12.0–15.0)
MCH: 29.7 pg (ref 26.0–34.0)
MCHC: 31.8 g/dL (ref 30.0–36.0)
MCV: 93.3 fL (ref 80.0–100.0)
Platelets: 332 10*3/uL (ref 150–400)
RBC: 3.6 MIL/uL — ABNORMAL LOW (ref 3.87–5.11)
RDW: 13.4 % (ref 11.5–15.5)
WBC: 16.3 10*3/uL — ABNORMAL HIGH (ref 4.0–10.5)
nRBC: 0 % (ref 0.0–0.2)

## 2020-09-29 LAB — GLUCOSE, CAPILLARY
Glucose-Capillary: 112 mg/dL — ABNORMAL HIGH (ref 70–99)
Glucose-Capillary: 128 mg/dL — ABNORMAL HIGH (ref 70–99)
Glucose-Capillary: 128 mg/dL — ABNORMAL HIGH (ref 70–99)
Glucose-Capillary: 131 mg/dL — ABNORMAL HIGH (ref 70–99)
Glucose-Capillary: 136 mg/dL — ABNORMAL HIGH (ref 70–99)
Glucose-Capillary: 141 mg/dL — ABNORMAL HIGH (ref 70–99)

## 2020-09-29 LAB — BASIC METABOLIC PANEL
Anion gap: 7 (ref 5–15)
BUN: 11 mg/dL (ref 8–23)
CO2: 27 mmol/L (ref 22–32)
Calcium: 8 mg/dL — ABNORMAL LOW (ref 8.9–10.3)
Chloride: 103 mmol/L (ref 98–111)
Creatinine, Ser: 0.71 mg/dL (ref 0.44–1.00)
GFR, Estimated: >60 mL/min (ref >60)
Glucose, Bld: 121 mg/dL — ABNORMAL HIGH (ref 70–99)
Potassium: 3.9 mmol/L (ref 3.5–5.1)
Sodium: 137 mmol/L (ref 135–145)

## 2020-09-29 NOTE — Plan of Care (Signed)
  Problem: Health Behavior/Discharge Planning: Goal: Ability to manage health-related needs will improve Outcome: Progressing   Problem: Clinical Measurements: Goal: Will remain free from infection Outcome: Progressing Goal: Respiratory complications will improve Outcome: Progressing Goal: Cardiovascular complication will be avoided Outcome: Progressing   Problem: Nutrition: Goal: Adequate nutrition will be maintained Outcome: Progressing   

## 2020-09-29 NOTE — Progress Notes (Signed)
Brief Palliative Medicine Progress Note:  Chart review performed.   Called [facility]'s Referral Specialist - report provided and referral given for patient to receive hospice services at discharge, likely at [facility].   [facility] will also need referral from physician at [facility] once placement is arranged. TOC consult placed.   Thank you for allowing PMT to assist in the care of this patient.  Myiah Petkus M. Tamala Julian, FNP-BC Palliative Medicine Team Team Phone: [phone] Total time: 15 minutes  Greater than 50%  of this time was spent counseling and coordinating care related to the above assessment and plan.

## 2020-09-29 NOTE — Progress Notes (Signed)
[facility]  PROGRESS NOTE  Makhia Vosler ZOX:[id number] DOB: 06/20/1940 DOA: 09/18/2020 PCP: [name], MD   Brief HPI:   [age] year old female with medical history of COPD, chronic systolic CHF 40 to 81%, hypothyroidism, breast cancer s/p left mastectomy presented from [facility] due to shortness of breath.  Patient has history of dementia so history was limited.  She was supposed to be on home oxygen at 2 L/min.  Presented with shortness of breath.  Found to have loculated right-sided pleural effusion.  Hospitalist for further management.    Subjective   Patient seen and examined, denies shortness of breath or chest pain.   Assessment/Plan:     1. Loculated right-sided pleural effusion-severe sepsis present on admission, acute on chronic respiratory failure with hypoxemia.  Patient presented with loculated effusion, CT surgery was consulted.  She was felt not to be a surgical candidate.  IR was consulted for chest tube placement.  Patient underwent chest tube placement on 4/27.  Gram stain did not show any organism and pleural fluid, total WBC and pleural fluid was 1264.  Pleural fluid culture from 4/28 was negative.  Patient antibiotic was changed. IV cefepime 4/26.  IV azithromycin, ceftriaxone 4/26/4/29, IV vancomycin 4/26/4/29, IV Unasyn 4/30- 5/3, Augmentin 5/3- 5/10.  Procalcitonin level improved to 0.49.    WBC still elevated at 16,000.  Pulmonology was consulted for management of chest tube.  She underwent dornase/tPA instillation per pulmonology.  Significant improvement in the pleural effusion noted on serial chest x-rays.  Chest tube was discontinued on 09/25/2020.  Currently she is on 2 L/min oxygen.  We will try to wean off oxygen, otherwise we will send patient home on 2 L/min.  There was concern for dysphagia, patient was seen by speech therapy on 5/4 and diet was downgraded to dysphagia 1 diet with nectar thick liquids.  Antibiotics changed to Augmentin to complete  course until 10/02/2020.  Chest x-ray stable.  PCCM signed off on 5//22.  Outpatient follow-up with Dr. Erin Fulling PCCM. 2. Dysphagia-was being seen by speech therapy, diet changed to dysphagia 1 diet on 5/4 with nectar thick liquids. 3. Hypoglycemia-resolved, CBG has been on low side.  We will continue to monitor CBG. 4. Leukocytosis-unclear if it is due to infectious etiology, she presented with WBC of 47,000, it has improved to 16,000. 5. Chest pain dyspnea-likely from pleural effusion.  No ischemic changes noted on EKG.  She is not a candidate for aggressive work-up.  No recurrence of chest pain. 6. Acute kidney injury-creatinine peaked to 1.36, due to poor p.o. intake.  She was given hydration with IV fluids.  Creatinine is down to 0.8. 7. Elevated troponin-mild, likely from demand ischemia.  No anginal symptoms. 8. Normocytic anemia-globin is stable at 10 g range. 9. Chronic systolic CHF-last echocardiogram showed EF of 40 to 45%.  Currently euvolemic. 10. Hypertension-continue home medications 11. Hypothyroidism-continue Synthroid 12. Hypokalemia-replete.  Potassium is 3.7 today. 13. History of dementia-stable, no behavioral disturbance 14. History of breast cancer-continue anastrozole 15. History of GERD-continue Pepcid 16. Goals of care-palliative care has been consulted, at this time plan is to go long-term care with hospice.  Patient's son lives in [place] and would prefer patient with close to him.  TOC is following   Scheduled medications:   . amoxicillin-clavulanate  1 tablet Oral Q12H  . anastrozole  1 mg Oral Daily  . arformoterol  15 mcg Nebulization BID  . atorvastatin  40 mg Oral QHS  . budesonide (PULMICORT) nebulizer  solution  0.5 mg Nebulization BID  . carvedilol  3.125 mg Oral BID WC  . docusate sodium  100 mg Oral BID  . enoxaparin (LOVENOX) injection  40 mg Subcutaneous Q24H  . famotidine  40 mg Oral QHS  . gabapentin  200 mg Oral BID  . levothyroxine  25 mcg Oral QAC  breakfast  . memantine  10 mg Oral BID  . mirabegron ER  25 mg Oral Daily  . polyethylene glycol  17 g Oral Daily  . saccharomyces boulardii  250 mg Oral BID  . venlafaxine XR  150 mg Oral Q breakfast         Data Reviewed:   CBG:  Recent Labs  Lab 09/28/20 2027 09/29/20 0036 09/29/20 0416 09/29/20 0738 09/29/20 1159  GLUCAP 135* 141* 131* 136* 112*    SpO2: 99 % O2 Flow Rate (L/min): 2 L/min FiO2 (%): 28 %    Vitals:   09/29/20 0415 09/29/20 0732 09/29/20 0836 09/29/20 0841  BP: 124/87 129/73    Pulse: 90 95    Resp: 18 18    Temp: 98 F (36.7 C) 97.6 F (36.4 C)    TempSrc: Oral Oral    SpO2: 100% 99% 99% 99%  Weight: 50 kg     Height:         Intake/Output Summary (Last 24 hours) at 09/29/2020 1423 Last data filed at 09/29/2020 0900 Gross per 24 hour  Intake 460 ml  Output 250 ml  Net 210 ml    05/05 1901 - 05/07 0700 In: [id number] [P.O.:814] Out: 650 [Urine:650]  Filed Weights   09/27/20 0422 09/28/20 0349 09/29/20 0415  Weight: 50.1 kg 51.2 kg 50 kg    CBC:  Recent Labs  Lab 09/25/20 0549 09/26/20 0448 09/27/20 0257 09/28/20 0435 09/29/20 0244  WBC 17.1* 18.4* 16.8* 16.0* 16.3*  HGB 10.2* 10.5* 10.5* 10.3* 10.7*  HCT 32.3* 31.9* 32.9* 31.9* 33.6*  PLT 322 325 329 298 332  MCV 92.0 89.4 90.9 90.4 93.3  MCH 29.1 29.4 29.0 29.2 29.7  MCHC 31.6 32.9 31.9 32.3 31.8  RDW 13.2 13.1 13.2 13.2 13.4  LYMPHSABS  --   --  1.5  --   --   MONOABS  --   --  1.2*  --   --   EOSABS  --   --  0.2  --   --   BASOSABS  --   --  0.0  --   --     Complete metabolic panel:  Recent Labs  Lab 09/24/20 0750 09/25/20 0549 09/26/20 0448 09/28/20 0435 09/29/20 0244  NA 137 135 137 136 137  K 4.5 3.9 3.6 3.3* 3.9  CL 98 99 101 98 103  CO2 27 29 28  32 27  GLUCOSE 99 87 51* 75 121*  BUN 31* 25* 20 8 11   CREATININE 1.36* 1.10* 1.00 0.80 0.71  CALCIUM 8.6* 8.1* 8.1* 8.0* 8.0*    No results for input(s): LIPASE, AMYLASE in the last 168 hours.  No  results for input(s): CRP, DDIMER, BNP, PROCALCITON, SARSCOV2NAA in the last 168 hours.  Invalid input(s): LACTICACID  ------------------------------------------------------------------------------------------------------------------ No results for input(s): CHOL, HDL, LDLCALC, TRIG, CHOLHDL, LDLDIRECT in the last 72 hours.  No results found for: HGBA1C ------------------------------------------------------------------------------------------------------------------ No results for input(s): TSH, T4TOTAL, T3FREE, THYROIDAB in the last 72 hours.  Invalid input(s): FREET3 ------------------------------------------------------------------------------------------------------------------ No results for input(s): VITAMINB12, FOLATE, FERRITIN, TIBC, IRON, RETICCTPCT in the last 72 hours.  Coagulation profile No results  for input(s): INR, PROTIME in the last 168 hours. No results for input(s): DDIMER in the last 72 hours.  Cardiac Enzymes No results for input(s): CKTOTAL, CKMB, CKMBINDEX, TROPONINI in the last 168 hours.  ------------------------------------------------------------------------------------------------------------------    Component Value Date/Time   BNP 296.9 (H) 09/18/2020 0802     Antibiotics: Anti-infectives (From admission, onward)   Start     Dose/Rate Route Frequency Ordered Stop   09/25/20 1400  amoxicillin-clavulanate (AUGMENTIN) 875-125 MG per tablet 1 tablet  Status:  Discontinued        1 tablet Oral Every 12 hours 09/25/20 1300 09/25/20 1312   09/25/20 1400  amoxicillin-clavulanate (AUGMENTIN) 500-125 MG per tablet 500 mg        1 tablet Oral Every 12 hours 09/25/20 1313 10/03/20 0959   09/25/20 0845  Ampicillin-Sulbactam (UNASYN) 3 g in sodium chloride 0.9 % 100 mL IVPB  Status:  Discontinued        3 g 200 mL/hr over 30 Minutes Intravenous Every 8 hours 09/25/20 0835 09/25/20 1300   09/25/20 0030  Ampicillin-Sulbactam (UNASYN) 3 g in sodium chloride 0.9 %  100 mL IVPB  Status:  Discontinued        3 g 200 mL/hr over 30 Minutes Intravenous Every 12 hours 09/24/20 1237 09/25/20 0835   09/22/20 1200  Ampicillin-Sulbactam (UNASYN) 3 g in sodium chloride 0.9 % 100 mL IVPB  Status:  Discontinued        3 g 200 mL/hr over 30 Minutes Intravenous Every 8 hours 09/22/20 1050 09/24/20 1237   09/19/20 1100  vancomycin (VANCOREADY) IVPB 750 mg/150 mL  Status:  Discontinued        750 mg 150 mL/hr over 60 Minutes Intravenous Every 24 hours 09/18/20 1255 09/21/20 1038   09/18/20 1600  cefTRIAXone (ROCEPHIN) 2 g in sodium chloride 0.9 % 100 mL IVPB  Status:  Discontinued        2 g 200 mL/hr over 30 Minutes Intravenous Every 24 hours 09/18/20 1526 09/22/20 1025   09/18/20 1600  azithromycin (ZITHROMAX) 500 mg in sodium chloride 0.9 % 250 mL IVPB  Status:  Discontinued        500 mg 250 mL/hr over 60 Minutes Intravenous Every 24 hours 09/18/20 1526 09/22/20 1025   09/18/20 0945  vancomycin (VANCOREADY) IVPB 1250 mg/250 mL        1,250 mg 166.7 mL/hr over 90 Minutes Intravenous  Once 09/18/20 0932 09/18/20 1334   09/18/20 0930  ceFEPIme (MAXIPIME) 2 g in sodium chloride 0.9 % 100 mL IVPB        2 g 200 mL/hr over 30 Minutes Intravenous  Once 09/18/20 0926 09/18/20 1043       Radiology Reports  No results found.    DVT prophylaxis: Lovenox  Code Status: DNR  Family Communication: No family at bedside   Consultants:  Cardiothoracic surgery  Interventional neurology  Pulmonology  Procedures:  Right-sided chest tube placement    Objective    Physical Examination:    General-appears in no acute distress  Heart-S1-S2, regular, no murmur auscultated  Lungs-clear to auscultation bilaterally, no wheezing or crackles auscultated  Abdomen-soft, nontender, no organomegaly  Extremities-no edema in the lower extremities  Neuro-alert, oriented x3, no focal deficit noted   Status is: Inpatient  Dispo: The patient is from: Home               Anticipated d/c is to: LTC with hospice  Anticipated d/c date is: 10/01/2020              Patient currently not stable for discharge  Barrier to discharge-none  COVID-19 Labs  No results for input(s): DDIMER, FERRITIN, LDH, CRP in the last 72 hours.  Lab Results  Component Value Date   [place] NEGATIVE 09/18/2020    Microbiology  Recent Results (from the past 240 hour(s))  Culture, body fluid w Gram Stain-bottle     Status: None   Collection Time: 09/19/20  4:46 PM   Specimen: Fluid  Result Value Ref Range Status   Specimen Description FLUID PLEURAL RIGHT  Final   Special Requests BOTTLES DRAWN AEROBIC AND ANAEROBIC  Final   Culture   Final    NO GROWTH 5 DAYS Performed at [facility], [address] [place], [place] [address]    Report Status 09/24/2020 FINAL  Final  Gram stain     Status: None   Collection Time: 09/19/20  4:46 PM   Specimen: Fluid  Result Value Ref Range Status   Specimen Description FLUID PLEURAL RIGHT  Final   Special Requests NONE  Final   Gram Stain   Final    RARE WBC PRESENT, PREDOMINANTLY PMN NO ORGANISMS SEEN Performed at [facility], [place] [address], [place], [place] [address]    Report Status 09/20/2020 FINAL  Final  MRSA PCR Screening     Status: None   Collection Time: 09/20/20  4:30 AM   Specimen: Nasal Mucosa; Nasopharyngeal  Result Value Ref Range Status   MRSA by PCR NEGATIVE NEGATIVE Final    Comment:        The GeneXpert MRSA Assay (FDA approved for NASAL specimens only), is one component of a comprehensive MRSA colonization surveillance program. It is not intended to diagnose MRSA infection nor to guide or monitor treatment for MRSA infections. Performed at [facility], [place] [address], [place], [place] [address]   Urine Culture     Status: Abnormal   Collection Time: 09/23/20  6:56 PM   Specimen: Urine, Random  Result Value Ref Range Status   Specimen Description URINE,  RANDOM  Final   Special Requests   Final    Unasyn Normal Performed at [facility], [place] [address], [place], [place] [address]    Culture 10,000 COLONIES/mL YEAST (A)  Final   Report Status 09/25/2020 FINAL  Final             Oswald Hillock   [facility] If 7PM-7AM, please contact night-coverage at [email or web address], Office  [phone]   09/29/2020, 2:23 PM  LOS: 11 days

## 2020-09-30 LAB — GLUCOSE, CAPILLARY
Glucose-Capillary: 113 mg/dL — ABNORMAL HIGH (ref 70–99)
Glucose-Capillary: 128 mg/dL — ABNORMAL HIGH (ref 70–99)
Glucose-Capillary: 81 mg/dL (ref 70–99)
Glucose-Capillary: 97 mg/dL (ref 70–99)

## 2020-09-30 NOTE — Progress Notes (Signed)
[facility]  PROGRESS NOTE  [name] FGH:[id number] DOB: Mar 03, 1941 DOA: 09/18/2020 PCP: [name], MD   Brief HPI:   [age] year old female with medical history of COPD, chronic systolic CHF 40 to 67%, hypothyroidism, breast cancer s/p left mastectomy presented from [facility] due to shortness of breath.  Patient has history of dementia so history was limited.  She was supposed to be on home oxygen at 2 L/min.  Presented with shortness of breath.  Found to have loculated right-sided pleural effusion.  Hospitalist for further management.    Subjective   Patient seen and examined, denies shortness of breath.   Assessment/Plan:     1. Loculated right-sided pleural effusion-severe sepsis present on admission, acute on chronic respiratory failure with hypoxemia.  Patient presented with loculated effusion, CT surgery was consulted.  She was felt not to be a surgical candidate.  IR was consulted for chest tube placement.  Patient underwent chest tube placement on 4/27.  Gram stain did not show any organism and pleural fluid, total WBC and pleural fluid was 1264.  Pleural fluid culture from 4/28 was negative.  Patient antibiotic was changed. IV cefepime 4/26.  IV azithromycin, ceftriaxone 4/26/4/29, IV vancomycin 4/26/4/29, IV Unasyn 4/30- 5/3, Augmentin 5/3- 5/10.  Procalcitonin level improved to 0.49.    WBC still elevated at 16,000.  Pulmonology was consulted for management of chest tube.  She underwent dornase/tPA instillation per pulmonology.  Significant improvement in the pleural effusion noted on serial chest x-rays.  Chest tube was discontinued on 09/25/2020.  Currently she is on 2 L/min oxygen.  We will try to wean off oxygen, otherwise we will send patient home on 2 L/min.  There was concern for dysphagia, patient was seen by speech therapy on 5/4 and diet was downgraded to dysphagia 1 diet with nectar thick liquids.  Antibiotics changed to Augmentin to complete course until  10/02/2020.  Chest x-ray stable.  PCCM signed off on 5//22.  Outpatient follow-up with Dr. Erin Fulling PCCM. 2. Dysphagia-was being seen by speech therapy, diet changed to dysphagia 1 diet on 5/4 with nectar thick liquids. 3. Hypoglycemia-resolved, CBG has been on low side.  We will continue to monitor CBG. 4. Leukocytosis-unclear if it is due to infectious etiology, she presented with WBC of 47,000, it has improved to 16,000. 5. Chest pain dyspnea-likely from pleural effusion.  No ischemic changes noted on EKG.  She is not a candidate for aggressive work-up.  No recurrence of chest pain. 6. Acute kidney injury-creatinine peaked to 1.36, due to poor p.o. intake.  She was given hydration with IV fluids.  Creatinine is down to 0.8. 7. Elevated troponin-mild, likely from demand ischemia.  No anginal symptoms. 8. Normocytic anemia-globin is stable at 10 g range. 9. Chronic systolic CHF-last echocardiogram showed EF of 40 to 45%.  Currently euvolemic. 10. Hypertension-continue home medications 11. Hypothyroidism-continue Synthroid 12. Hypokalemia-replete.  Potassium is 3.7 today. 13. History of dementia-stable, no behavioral disturbance 14. History of breast cancer-continue anastrozole 15. History of GERD-continue Pepcid 16. Goals of care-palliative care has been consulted, at this time plan is to go long-term care with hospice.  Patient's son lives in [place] and would prefer patient with close to him.  TOC is following   Scheduled medications:   . amoxicillin-clavulanate  1 tablet Oral Q12H  . anastrozole  1 mg Oral Daily  . arformoterol  15 mcg Nebulization BID  . atorvastatin  40 mg Oral QHS  . budesonide (PULMICORT) nebulizer solution  0.5  mg Nebulization BID  . carvedilol  3.125 mg Oral BID WC  . docusate sodium  100 mg Oral BID  . enoxaparin (LOVENOX) injection  40 mg Subcutaneous Q24H  . famotidine  40 mg Oral QHS  . gabapentin  200 mg Oral BID  . levothyroxine  25 mcg Oral QAC breakfast  .  memantine  10 mg Oral BID  . mirabegron ER  25 mg Oral Daily  . polyethylene glycol  17 g Oral Daily  . saccharomyces boulardii  250 mg Oral BID  . venlafaxine XR  150 mg Oral Q breakfast         Data Reviewed:   CBG:  Recent Labs  Lab 09/29/20 1159 09/29/20 1628 09/29/20 2004 09/30/20 0046 09/30/20 0423  GLUCAP 112* 128* 128* 113* 81    SpO2: 98 % O2 Flow Rate (L/min): 2 L/min FiO2 (%): 28 %    Vitals:   09/29/20 2014 09/30/20 0423 09/30/20 0812 09/30/20 0838  BP:  125/69 (!) 120/58 120/62  Pulse:  83 87 85  Resp:  14 20 18   Temp:  97.7 F (36.5 C)  97.7 F (36.5 C)  TempSrc:  Oral  Oral  SpO2: 98% 99% 100% 98%  Weight:  49.7 kg    Height:         Intake/Output Summary (Last 24 hours) at 09/30/2020 1013 Last data filed at 09/30/2020 0834 Gross per 24 hour  Intake 160 ml  Output 375 ml  Net -215 ml    05/06 1901 - 05/08 0700 In: 580 [P.O.:580] Out: 625 [Urine:625]  Filed Weights   09/28/20 0349 09/29/20 0415 09/30/20 0423  Weight: 51.2 kg 50 kg 49.7 kg    CBC:  Recent Labs  Lab 09/25/20 0549 09/26/20 0448 09/27/20 0257 09/28/20 0435 09/29/20 0244  WBC 17.1* 18.4* 16.8* 16.0* 16.3*  HGB 10.2* 10.5* 10.5* 10.3* 10.7*  HCT 32.3* 31.9* 32.9* 31.9* 33.6*  PLT 322 325 329 298 332  MCV 92.0 89.4 90.9 90.4 93.3  MCH 29.1 29.4 29.0 29.2 29.7  MCHC 31.6 32.9 31.9 32.3 31.8  RDW 13.2 13.1 13.2 13.2 13.4  LYMPHSABS  --   --  1.5  --   --   MONOABS  --   --  1.2*  --   --   EOSABS  --   --  0.2  --   --   BASOSABS  --   --  0.0  --   --     Complete metabolic panel:  Recent Labs  Lab 09/24/20 0750 09/25/20 0549 09/26/20 0448 09/28/20 0435 09/29/20 0244  NA 137 135 137 136 137  K 4.5 3.9 3.6 3.3* 3.9  CL 98 99 101 98 103  CO2 27 29 28  32 27  GLUCOSE 99 87 51* 75 121*  BUN 31* 25* 20 8 11   CREATININE 1.36* 1.10* 1.00 0.80 0.71  CALCIUM 8.6* 8.1* 8.1* 8.0* 8.0*    No results for input(s): LIPASE, AMYLASE in the last 168 hours.  No  results for input(s): CRP, DDIMER, BNP, PROCALCITON, SARSCOV2NAA in the last 168 hours.  Invalid input(s): LACTICACID  ------------------------------------------------------------------------------------------------------------------ No results for input(s): CHOL, HDL, LDLCALC, TRIG, CHOLHDL, LDLDIRECT in the last 72 hours.  No results found for: HGBA1C ------------------------------------------------------------------------------------------------------------------ No results for input(s): TSH, T4TOTAL, T3FREE, THYROIDAB in the last 72 hours.  Invalid input(s): FREET3 ------------------------------------------------------------------------------------------------------------------ No results for input(s): VITAMINB12, FOLATE, FERRITIN, TIBC, IRON, RETICCTPCT in the last 72 hours.  Coagulation profile No results for input(s):  INR, PROTIME in the last 168 hours. No results for input(s): DDIMER in the last 72 hours.  Cardiac Enzymes No results for input(s): CKTOTAL, CKMB, CKMBINDEX, TROPONINI in the last 168 hours.  ------------------------------------------------------------------------------------------------------------------    Component Value Date/Time   BNP 296.9 (H) 09/18/2020 0802     Antibiotics: Anti-infectives (From admission, onward)   Start     Dose/Rate Route Frequency Ordered Stop   09/25/20 1400  amoxicillin-clavulanate (AUGMENTIN) 875-125 MG per tablet 1 tablet  Status:  Discontinued        1 tablet Oral Every 12 hours 09/25/20 1300 09/25/20 1312   09/25/20 1400  amoxicillin-clavulanate (AUGMENTIN) 500-125 MG per tablet 500 mg        1 tablet Oral Every 12 hours 09/25/20 1313 10/03/20 0959   09/25/20 0845  Ampicillin-Sulbactam (UNASYN) 3 g in sodium chloride 0.9 % 100 mL IVPB  Status:  Discontinued        3 g 200 mL/hr over 30 Minutes Intravenous Every 8 hours 09/25/20 0835 09/25/20 1300   09/25/20 0030  Ampicillin-Sulbactam (UNASYN) 3 g in sodium chloride 0.9 %  100 mL IVPB  Status:  Discontinued        3 g 200 mL/hr over 30 Minutes Intravenous Every 12 hours 09/24/20 1237 09/25/20 0835   09/22/20 1200  Ampicillin-Sulbactam (UNASYN) 3 g in sodium chloride 0.9 % 100 mL IVPB  Status:  Discontinued        3 g 200 mL/hr over 30 Minutes Intravenous Every 8 hours 09/22/20 1050 09/24/20 1237   09/19/20 1100  vancomycin (VANCOREADY) IVPB 750 mg/150 mL  Status:  Discontinued        750 mg 150 mL/hr over 60 Minutes Intravenous Every 24 hours 09/18/20 1255 09/21/20 1038   09/18/20 1600  cefTRIAXone (ROCEPHIN) 2 g in sodium chloride 0.9 % 100 mL IVPB  Status:  Discontinued        2 g 200 mL/hr over 30 Minutes Intravenous Every 24 hours 09/18/20 1526 09/22/20 1025   09/18/20 1600  azithromycin (ZITHROMAX) 500 mg in sodium chloride 0.9 % 250 mL IVPB  Status:  Discontinued        500 mg 250 mL/hr over 60 Minutes Intravenous Every 24 hours 09/18/20 1526 09/22/20 1025   09/18/20 0945  vancomycin (VANCOREADY) IVPB 1250 mg/250 mL        1,250 mg 166.7 mL/hr over 90 Minutes Intravenous  Once 09/18/20 0932 09/18/20 1334   09/18/20 0930  ceFEPIme (MAXIPIME) 2 g in sodium chloride 0.9 % 100 mL IVPB        2 g 200 mL/hr over 30 Minutes Intravenous  Once 09/18/20 0926 09/18/20 1043       Radiology Reports  No results found.    DVT prophylaxis: Lovenox  Code Status: DNR  Family Communication: No family at bedside   Consultants:  Cardiothoracic surgery  Interventional neurology  Pulmonology  Procedures:  Right-sided chest tube placement    Objective    Physical Examination:    General-appears in no acute distress  Heart-S1-S2, regular, no murmur auscultated  Lungs-clear to auscultation bilaterally, no wheezing or crackles auscultated  Abdomen-soft, nontender, no organomegaly  Extremities-no edema in the lower extremities  Neuro-alert, oriented x3, no focal deficit noted   Status is: Inpatient  Dispo: The patient is from: Home               Anticipated d/c is to: LTC with hospice  Anticipated d/c date is: 10/01/2020              Patient currently stable for discharge  Barrier to discharge-awaiting skilled nursing facility bed  COVID-19 Labs  No results for input(s): DDIMER, FERRITIN, LDH, CRP in the last 72 hours.  Lab Results  Component Value Date   [place] NEGATIVE 09/18/2020    Microbiology  Recent Results (from the past 240 hour(s))  Urine Culture     Status: Abnormal   Collection Time: 09/23/20  6:56 PM   Specimen: Urine, Random  Result Value Ref Range Status   Specimen Description URINE, RANDOM  Final   Special Requests   Final    Unasyn Normal Performed at [facility], [address], [place], [place] [address]    Culture 10,000 COLONIES/mL YEAST (A)  Final   Report Status 09/25/2020 FINAL  Final        Oswald Hillock   [facility] If 7PM-7AM, please contact night-coverage at [email or web address], Office  [phone]   09/30/2020, 10:13 AM  LOS: 12 days

## 2020-09-30 NOTE — Progress Notes (Signed)
Sent Dr. Darrick Meigs secure chat and made MD aware of blood pressure 104/56 and heart rate 90 with coreg 3.125 mg due.  MD gave order to hold 1700 dose of coreg.

## 2020-09-30 NOTE — Plan of Care (Signed)
  Problem: Education: Goal: Knowledge of General Education information will improve Description Including pain rating scale, medication(s)/side effects and non-pharmacologic comfort measures Outcome: Progressing   Problem: Health Behavior/Discharge Planning: Goal: Ability to manage health-related needs will improve Outcome: Progressing   

## 2020-10-01 LAB — GLUCOSE, CAPILLARY
Glucose-Capillary: 113 mg/dL — ABNORMAL HIGH (ref 70–99)
Glucose-Capillary: 113 mg/dL — ABNORMAL HIGH (ref 70–99)
Glucose-Capillary: 120 mg/dL — ABNORMAL HIGH (ref 70–99)
Glucose-Capillary: 87 mg/dL (ref 70–99)

## 2020-10-01 LAB — RESP PANEL BY RT-PCR (FLU A&B, COVID) ARPGX2
Influenza A by PCR: NEGATIVE
Influenza B by PCR: NEGATIVE
SARS Coronavirus 2 by RT PCR: NEGATIVE

## 2020-10-01 MED ORDER — DOCUSATE SODIUM 100 MG PO CAPS: 100.0000 mg | ORAL_CAPSULE | Freq: Two times a day (BID) | ORAL | Status: AC

## 2020-10-01 MED ORDER — RESOURCE THICKENUP CLEAR PO POWD: ORAL | Status: AC

## 2020-10-01 MED ORDER — ACETAMINOPHEN ER 650 MG PO TBCR: 650.0000 mg | EXTENDED_RELEASE_TABLET | Freq: Two times a day (BID) | ORAL | Status: AC | PRN

## 2020-10-01 MED ORDER — SACCHAROMYCES BOULARDII 250 MG PO CAPS: 250.0000 mg | ORAL_CAPSULE | Freq: Two times a day (BID) | ORAL | Status: AC

## 2020-10-01 MED ORDER — AMOXICILLIN-POT CLAVULANATE 500-125 MG PO TABS: 1.0000 | ORAL_TABLET | Freq: Two times a day (BID) | ORAL | Status: AC

## 2020-10-01 MED ORDER — POLYETHYLENE GLYCOL 3350 17 G PO PACK: 17.0000 g | PACK | Freq: Every day | ORAL | Status: AC

## 2020-10-01 MED ORDER — ALBUTEROL SULFATE (2.5 MG/3ML) 0.083% IN NEBU: 2.5000 mg | INHALATION_SOLUTION | Freq: Four times a day (QID) | RESPIRATORY_TRACT | Status: AC | PRN

## 2020-10-01 NOTE — NC FL2 (Signed)
[place] LEVEL OF CARE SCREENING TOOL     IDENTIFICATION  Patient Name: [name] Birthdate: [birth year] Sex: female Admission Date (Current Location): 09/18/2020  [facility] and [place] Number:  Herbalist and Address:  [facility], [place] [address], [place], [place] [address]      Provider Number: [id number]  Attending Physician Name and Address:  Modena Jansky, MD  Relative Name and Phone Number:  Korynn, Kenedy [phone])   [phone]    Current Level of Care: Hospital Recommended Level of Care:  ([place]) Prior Approval Number:    Date Approved/Denied:   PASRR Number:    Discharge Plan: Other (Comment) (ALF ([facility]))    Current Diagnoses: Patient Active Problem List   Diagnosis Date Noted  . Pleural effusion   . Empyema lung ([place]) 09/18/2020  . Sepsis with acute respiratory failure ([place]) 09/18/2020  . Thrombocytosis 09/18/2020  . Leukocytosis 09/18/2020  . Elevated troponin 09/18/2020  . Dementia ([place]) 09/18/2020  . COPD with acute exacerbation ([place]) 01/30/2016  . Cough 01/30/2016  . Breast cancer of lower-inner quadrant of left female breast ([place]) 01/29/2016  . GI bleed 09/26/2015  . Acute GI bleeding 09/26/2015  . Chest tube in place 09/26/2015  . HLD (hyperlipidemia) 09/26/2015  . GI bleeding 09/26/2015  . Acute pulmonary embolism ([place]) 07/13/2013  . Acute renal failure ([place]) 07/08/2013  . Metabolic acidosis 97/35/3299  . Hypokalemia 07/08/2013  . Anemia 07/08/2013  . History of breast cancer 07/08/2013  . CHF (congestive heart failure) ([place]) 07/08/2013  . Diarrhea 07/08/2013  . Renal failure 07/07/2013    Orientation RESPIRATION BLADDER Height & Weight     Self  O2 (2L Nasal Cannula) Incontinent,External catheter Weight: 108 lb 14.5 oz (49.4 kg) Height:  5\' 2"  (157.5 cm)  BEHAVIORAL SYMPTOMS/MOOD NEUROLOGICAL BOWEL NUTRITION STATUS      Incontinent  (DIET - DYS 1 Room service  appropriate? No; Fluid consistency: Nectar Thick)  AMBULATORY STATUS COMMUNICATION OF NEEDS Skin   Extensive Assist Verbally Normal                       Personal Care Assistance Level of Assistance  Bathing,Feeding,Dressing Bathing Assistance: Maximum assistance Feeding assistance: Limited assistance Dressing Assistance: Maximum assistance     Functional Limitations Info  Sight,Hearing,Speech Sight Info: Adequate Hearing Info: Adequate Speech Info: Adequate    SPECIAL CARE FACTORS FREQUENCY  PT (By licensed PT),OT (By licensed OT)     PT Frequency: 3x a week OT Frequency: 3x a week            Contractures Contractures Info: Not present    Additional Factors Info  Code Status,Allergies Code Status Info: DNR Allergies Info: NKA           Current Medications (10/01/2020):  This is the current hospital active medication list Current Facility-Administered Medications  Medication Dose Route Frequency Provider Last Rate Last Admin  . acetaminophen (TYLENOL) tablet 650 mg  650 mg Oral Q6H PRN Bonnielee Haff, MD   650 mg at 09/20/20 1606  . albuterol (PROVENTIL) (2.5 MG/3ML) 0.083% nebulizer solution 2.5 mg  2.5 mg Nebulization Q2H PRN Fuller Plan A, MD   2.5 mg at 09/19/20 0707  . amoxicillin-clavulanate (AUGMENTIN) 500-125 MG per tablet 500 mg  1 tablet Oral Q12H Karren Cobble, RPH   500 mg at 09/30/20 2135  . anastrozole (ARIMIDEX) tablet 1 mg  1 mg Oral Daily Tamala Julian,  Rondell A, MD   1 mg at 09/30/20 0912  . arformoterol (BROVANA) nebulizer solution 15 mcg  15 mcg Nebulization BID Fuller Plan A, MD   15 mcg at 10/01/20 0904  . atorvastatin (LIPITOR) tablet 40 mg  40 mg Oral QHS Fuller Plan A, MD   40 mg at 09/30/20 2132  . budesonide (PULMICORT) nebulizer solution 0.5 mg  0.5 mg Nebulization BID Fuller Plan A, MD   0.5 mg at 10/01/20 0904  . carvedilol (COREG) tablet 3.125 mg  3.125 mg Oral BID WC Smith, Rondell A, MD   3.125 mg at 10/01/20 0815  .  docusate sodium (COLACE) capsule 100 mg  100 mg Oral BID Bonnielee Haff, MD   100 mg at 09/30/20 2132  . enoxaparin (LOVENOX) injection 40 mg  40 mg Subcutaneous Q24H Karren Cobble, RPH   40 mg at 09/30/20 1652  . famotidine (PEPCID) tablet 40 mg  40 mg Oral QHS Fuller Plan A, MD   40 mg at 09/30/20 2132  . gabapentin (NEURONTIN) capsule 200 mg  200 mg Oral BID Fuller Plan A, MD   200 mg at 09/30/20 2132  . levothyroxine (SYNTHROID) tablet 25 mcg  25 mcg Oral QAC breakfast Fuller Plan A, MD   25 mcg at 10/01/20 0512  . memantine (NAMENDA) tablet 10 mg  10 mg Oral BID Fuller Plan A, MD   10 mg at 09/30/20 2132  . mirabegron ER (MYRBETRIQ) tablet 25 mg  25 mg Oral Daily Fuller Plan A, MD   25 mg at 09/30/20 0911  . ondansetron (ZOFRAN) injection 4 mg  4 mg Intravenous Q6H PRN Bonnielee Haff, MD      . oxyCODONE (Oxy IR/ROXICODONE) immediate release tablet 5 mg  5 mg Oral Q4H PRN Bonnielee Haff, MD   5 mg at 09/28/20 1513  . polyethylene glycol (MIRALAX / GLYCOLAX) packet 17 g  17 g Oral Daily Bonnielee Haff, MD   17 g at 09/29/20 0907  . Resource ThickenUp Clear   Oral PRN Bonnielee Haff, MD      . saccharomyces boulardii (FLORASTOR) capsule 250 mg  250 mg Oral BID Bonnielee Haff, MD   250 mg at 09/30/20 2132  . venlafaxine XR (EFFEXOR-XR) 24 hr capsule 150 mg  150 mg Oral Q breakfast Fuller Plan A, MD   150 mg at 10/01/20 0815     Discharge Medications: Please see discharge summary for a list of discharge medications.  Relevant Imaging Results:  Relevant Lab Results:   Additional Information SSN# [id number]  Reece Agar, [place]

## 2020-10-01 NOTE — Progress Notes (Signed)
D/C instructions printed and placed in packet at nurse's station. Tele and IV removed, tolerated well. Awaiting covid test results.

## 2020-10-01 NOTE — TOC Transition Note (Signed)
Transition of Care [facility]) - CM/SW Discharge Note   Patient Details  Name: [name] MRN: [id number] Date of Birth: [birth year]  [facility] CM/SW Contact:  Tresa Endo Phone Number: 10/01/2020, 12:35 PM   Clinical Narrative:    Patient will DC to: [facility] Anticipated DC date: 10/01/2020 Family notified: Pt Son Transport by: Corey Harold   Per MD patient ready for DC to [facility] . RN to call report prior to discharge [phone]). RN, patient, patient's family, and facility notified of DC. Discharge Summary and FL2 sent to facility. DC packet on chart. Ambulance transport requested for patient.   CSW will sign off for now as social work intervention is no longer needed. Please consult [place] again if new needs arise.            Patient Goals and CMS Choice        Discharge Placement                       Discharge Plan and Services                                     Social Determinants of Health (SDOH) Interventions     Readmission Risk Interventions No flowsheet data found.

## 2020-10-01 NOTE — Discharge Instructions (Signed)

## 2020-10-01 NOTE — Progress Notes (Signed)
Daily Progress Note   Patient Name: [name]       Date: 10/01/2020 DOB: 11-Nov-1940  Age: 80 y.o. MRN#: [id number] Attending Physician: Modena Jansky, MD Primary Care Physician: [name], MD Admit Date: 09/18/2020  Reason for Consultation/Follow-up: Disposition, Non pain symptom management, Pain control and Psychosocial/spiritual support  Subjective: Chart review performed -noted patient discharging back to [facility] today.  Received updates from primary RN -no acute concerns.  RN reports patient remains confused to self only and for breakfast only ate a Magic cup.  Went to visit patient at bedside -no family/visitors present.  Patient was lying in bed awake, alert, oriented to self only (pleasantly confused), able to participate in simple conversation. No signs or non-verbal gestures of pain or discomfort noted. No respiratory distress, increased work of breathing, or secretions noted.  Patient denies pain or discomfort.  Her lunch tray was sitting in front of her -I offered to assist her with eating - she declined by saying "no thank you" as she is "not hungry."  1:02 PM Attempted to call son/Brian to to offer support and answer any questions before discharge.  Also wished to notify him that referral to [facility] had been sent from the hospital; however, [place] physician also needed to send referral so he was aware of next steps. No answer - confidential voicemail left with information outlined above - request to return phone call only if outstanding questions/concerns PMT could assist with.  Length of Stay: 13  Current Medications: Scheduled Meds:  . amoxicillin-clavulanate  1 tablet Oral Q12H  . anastrozole  1 mg Oral Daily  . arformoterol  15 mcg  Nebulization BID  . atorvastatin  40 mg Oral QHS  . budesonide (PULMICORT) nebulizer solution  0.5 mg Nebulization BID  . carvedilol  3.125 mg Oral BID WC  . docusate sodium  100 mg Oral BID  . enoxaparin (LOVENOX) injection  40 mg Subcutaneous Q24H  . famotidine  40 mg Oral QHS  . gabapentin  200 mg Oral BID  . levothyroxine  25 mcg Oral QAC breakfast  . memantine  10 mg Oral BID  . mirabegron ER  25 mg Oral Daily  . polyethylene glycol  17 g Oral Daily  . saccharomyces boulardii  250 mg Oral BID  .  venlafaxine XR  150 mg Oral Q breakfast    Continuous Infusions:   PRN Meds: acetaminophen, albuterol, ondansetron (ZOFRAN) IV, oxyCODONE, Resource ThickenUp Clear  Physical Exam Vitals and nursing note reviewed.  Constitutional:      General: She is not in acute distress.    Appearance: She is cachectic.  Pulmonary:     Effort: No respiratory distress.  Skin:    General: Skin is warm and dry.  Neurological:     Mental Status: She is alert. Mental status is at baseline. She is disoriented and confused.     Motor: Weakness present.  Psychiatric:        Attention and Perception: Attention normal.        Behavior: Behavior is cooperative.        Cognition and Memory: Cognition is impaired. Memory is impaired.             Vital Signs: BP 120/67 (BP Location: Right Arm)   Pulse 94   Temp 98 F (36.7 C) (Oral)   Resp 16   Ht 5\' 2"  (1.575 m)   Wt 49.4 kg   SpO2 95%   BMI 19.92 kg/m  SpO2: SpO2: 95 % O2 Device: O2 Device: Nasal Cannula O2 Flow Rate: O2 Flow Rate (L/min): 2 L/min  Intake/output summary:   Intake/Output Summary (Last 24 hours) at 10/01/2020 1302 Last data filed at 10/01/2020 2202 Gross per 24 hour  Intake 200 ml  Output 300 ml  Net -100 ml   LBM: Last BM Date: 09/29/20 Baseline Weight: Weight: 55 kg Most recent weight: Weight: 49.4 kg       Palliative Assessment/Data: PPS 20 to 30%    Flowsheet Rows   Flowsheet Row Most Recent Value  Intake  Tab   Referral Department Hospitalist  Unit at Time of Referral Cardiac/Telemetry Unit  Date Notified 09/22/20  Palliative Care Type New Palliative care  Reason for referral Clarify Goals of Care, Non-pain Symptom  Date of Admission 09/18/20  Date first seen by Palliative Care 09/22/20  # of days Palliative referral response time 0 Day(s)  # of days IP prior to Palliative referral 4  Clinical Assessment   Psychosocial & Spiritual Assessment   Palliative Care Outcomes       Patient Active Problem List   Diagnosis Date Noted  . Pleural effusion   . Empyema lung ([place]) 09/18/2020  . Sepsis with acute respiratory failure ([place]) 09/18/2020  . Thrombocytosis 09/18/2020  . Leukocytosis 09/18/2020  . Elevated troponin 09/18/2020  . Dementia ([place]) 09/18/2020  . COPD with acute exacerbation ([place]) 01/30/2016  . Cough 01/30/2016  . Breast cancer of lower-inner quadrant of left female breast ([place]) 01/29/2016  . GI bleed 09/26/2015  . Acute GI bleeding 09/26/2015  . Chest tube in place 09/26/2015  . HLD (hyperlipidemia) 09/26/2015  . GI bleeding 09/26/2015  . Acute pulmonary embolism ([place]) 07/13/2013  . Acute renal failure ([place]) 07/08/2013  . Metabolic acidosis 54/27/0623  . Hypokalemia 07/08/2013  . Anemia 07/08/2013  . History of breast cancer 07/08/2013  . CHF (congestive heart failure) ([place]) 07/08/2013  . Diarrhea 07/08/2013  . Renal failure 07/07/2013    Palliative Care Assessment & Plan   Patient Profile: 80 y.o.femalewith past medical history of COPD, systolic CHF (EF 76-28%), hypothyroidism, dementia, and breast cancer s/p left mastectomy who presented to the emergency departmenton 4/26/2022with shortness of breath.At baseline, she wears oxygen 2L at night. Family reporting that patient was getting choked up and coughing while  eating and talking. ED Course: Required NRB mask to maintain O2 saturations. WBC 47.1, tachycradia, and tachypnea. Chest x-ray showed right lung  base infiltrate with moderate right-sided pleural effusion.  Admitted to [facility] with sepsis secondary to right empyema and acute hypoxic respiratory failure. Right chest tube placed 4/27.  Assessment: loculated right pleural effusion Chest pain and dyspnea COPD with acute exacerbation Chronic systolic CHF Dementia History of breast cancer  Recommendations/Plan: Plan is for discharge today back to [facility] with hospice Referral previously sent to [facility] physician will also need to ensure they send referral Continue DNR/DNI as previously documented - durable DNR form completed and placed in shadow chart; copy was made and will be scanned into ACP tab/Vynca  Goals of Care and Additional Recommendations: Limitations on Scope of Treatment: Avoid Hospitalization, No Artificial Feeding and No Tracheostomy  Code Status:    Code Status Orders  (From admission, onward)         Start     Ordered   09/19/20 1409  Do not attempt resuscitation (DNR)  Continuous       Question Answer Comment  In the event of cardiac or respiratory ARREST Do not call a "code blue"   In the event of cardiac or respiratory ARREST Do not perform Intubation, CPR, defibrillation or ACLS   In the event of cardiac or respiratory ARREST Use medication by any route, position, wound care, and other measures to relive pain and suffering. May use oxygen, suction and manual treatment of airway obstruction as needed for comfort.      09/19/20 1408        Code Status History    Date Active Date Inactive Code Status Order ID Comments User Context   09/18/2020 1527 09/19/2020 1408 Full Code [id number]  Norval Morton, MD ED   02/26/2016 1324 02/27/2016 1446 Full Code [id number]  Coralie Keens, MD Inpatient   09/26/2015 2255 09/27/2015 1841 DNR [id number]  [name], MD ED   07/08/2013 0327 07/14/2013 1942 Full Code [id number]  [name], MD ED   Advance Care Planning Activity       Prognosis:  < 6 months  Discharge Planning: [place] with Hospice  Care plan was discussed with primary RN, TOC, Dr. Algis Liming  Thank you for allowing the Palliative Medicine Team to assist in the care of this patient.   Total Time  15 minutes Prolonged Time Billed  no       Greater than 50%  of this time was spent counseling and coordinating care related to the above assessment and plan.  Lin Landsman, NP  Please contact Palliative Medicine Team phone at [phone] for questions and concerns.   *Portions of this note are a verbal dictation therefore any spelling and/or grammatical errors are due to the "[place]" system interpretation.

## 2020-10-01 NOTE — TOC Progression Note (Signed)
Transition of Care [facility]) - Progression Note    Patient Details  Name: [name] MRN: [id number] Date of Birth: 20-Mar-1941  Transition of [facility]) CM/SW Contact  Reece Agar, [place] Phone Number: 10/01/2020, 12:39 PM  Clinical Narrative:    9:20am- CSW spoke with pt son to update him on his mothers DC today they requested a covid test before DC. CSW requested covid from MD and will contact [facility] as soon as that result is back.  10:00am- Pt will DC to [facility] today, rapid covid has came back negative and DC Summary has been sent to [facility]. Ronalee Belts at [facility] said pt is good to return CSW will follow up with pt son and Corey Harold has been called. PT DC.        Expected Discharge Plan and Services           Expected Discharge Date: 10/01/20                                     Social Determinants of Health (SDOH) Interventions    Readmission Risk Interventions No flowsheet data found.

## 2020-10-01 NOTE — Plan of Care (Signed)

## 2020-10-01 NOTE — Discharge Summary (Signed)
Physician Discharge Summary  Lavora Kreller [id number] DOB: 11/02/1940  PCP: [name], MD  Admitted from: [facility] facility Discharged to: [facility] memory care unit  Admit date: 09/18/2020 Discharge date: 10/01/2020  Recommendations for Outpatient Follow-up:    Follow-up Information    Martyn Ehrich, NP Follow up on 10/16/2020.   Specialty: Pulmonary Disease Why: Your appointment is at 10am. Please show up 15-20 minutes prior to your appointment time to have a chest x-ray done.  Contact information: [place] [place]  [id number] [phone]        MD at SNF. Schedule an appointment as soon as possible for a visit.   Why: To be seen in 2 to 3 days with repeat labs (CBC & BMP). [facility] will also need referral from physician at [facility]       Morrow, Marjory Lies, MD. Schedule an appointment as soon as possible for a visit.   Specialty: Family Medicine Why: Upon discharge from SNF. Contact information: [address] [place] [address] [phone]                Home Health: N/A    Equipment/Devices: TBD at memory care unit.    Discharge Condition: Improved and stable.   Code Status: DNR Diet recommendation:  Discharge Diet Orders (From admission, onward)    Start     Ordered   10/01/20 0000  Diet - low sodium heart healthy       Comments: ST recs: Diet recommendations: Dysphagia 1 (puree);Nectar-thick liquid. Liquids provided via: Cup;Straw Medication Administration: Crushed with puree. Supervision: Patient able to self feed;Full supervision/cueing for compensatory strategies. Compensations: Minimize environmental distractions;Slow rate;Small sips/bites;Clear throat intermittently. Postural Changes and/or Swallow Maneuvers: Seated upright 90 degrees;Upright 30-60 min after meal   10/01/20 1027           Discharge Diagnoses:  Principal Problem:   Sepsis with acute respiratory  failure ([place]) Active Problems:   CHF (congestive heart failure) (HCC)   Chest tube in place   COPD with acute exacerbation (HCC)   Empyema lung (HCC)   Thrombocytosis   Leukocytosis   Elevated troponin   Dementia (HCC)   Pleural effusion   Brief Summary: 80 y.o.femalewith medical history significant ofCOPD, chronic systolicCHF40-45%, hypothyroidism,breast cancers/p leftmastectomypresents from [facility] due to shortness of breath. Patient with history of dementia and so history was limited.  Supposed to be on home oxygen at 2 L/min.  Presented with shortness of breath.  Was found to have a loculated right-sided pleural effusion.  Hospitalized for further management.  Assessment/Plan:  Loculated right-sided pleural effusion/severe sepsis present on admission/acute on chronic respiratory failure with hypoxia Due to loculated effusion. Cardiothoracic surgery was consulted and not thought to be a surgical candidate. Evaluation by IR was recommended. Patient underwent chest tube placement on 4/27. Gram stain did not show any organisms.  Total WBC in the pleural fluid was 1264.  Pleural fluid culture from 4/28: Negative, final report. Patient's antibiotic regimen was changed.  IV cefepime 4/26.  IV azithromycin, ceftriaxone 4/26/4/29, IV vancomycin 4/26/4/29, IV Unasyn 4/30- 5/3, Augmentin 5/3- 5/10.  Procalcitonin level improved to 0.49.   WBC had been improving and was down to 12.9 but then started rising again.  Could be due to tPA per pulmonology notes.  Leukocytosis gradually improving and stable in the 16 range for several days. Afebrile.  Follow CBC at nursing facility. Appreciate pulmonology assistance with management of  chest tube.  She underwent dornase/tPA instillation per pulmonology.  Significant improvement in the pleural effusion noted on serial chest x-rays.  Chest tube discontinued 5/3. Reportedly on 2 L/min nightly oxygen at nursing facility prior to admission.   Currently weaned off to room air and not hypoxic.  Close monitoring of oxygen saturations both during the day and night at nursing facility to determine if she needs to go back on oxygen again. There was some concern for aspiration.    Speech therapy continued to evaluate in the hospital and make necessary dietary consistency changes.  Diet as noted above. Antibiotics changed to Augmentin to complete course 10/02/2020. Chest x-ray stable.  PCCM signed off 5/4.  Outpatient follow-up with Dr. Erin Fulling, PCCM.  Dysphagia Speech therapy continued to evaluate in the hospital and made necessary dietary recommendations.  Diet recommendations as noted above.  Consider speech therapy follow-up at nursing facility to reassess  Hypoglycemia CBG 50 along with blood glucose 51 on labs on 5/4 morning.  Treated per hypoglycemia protocol.  Likely due to poor oral intake.  With improvement in oral intake, hypoglycemia has resolved.  Leukocytosis  Unclear if this is all only related to infectious etiology.  Presented with WBC as high as 47, neutrophilic.  Improved and WBC count down in the 16 range for several days.  Follow CBC closely as outpatient and may consider further evaluation if remains persistently high or worsens.  Chest pain and dyspnea Symptoms likely due to her pleural effusion.  No ischemic findings noted on EKG. She is not a candidate for aggressive work-up.  No recurrence of chest pain.  Acute kidney injury Creatinine had peaked to 1.36, likely due to poor oral intake.  Briefly hydrated with IV fluids.  AKI resolved but remains at risk for recurrent dehydration and AKI if she has inconsistent oral intake.  Monitor BMP periodically at SNF especially while on low-dose Lasix for her chronic systolic CHF.  COPD with acute exacerbation Improved with steroids and nebulizer treatments.  Acute exacerbation resolved  Mildly elevated troponin Likely due to demand ischemia.  EKG without any acute  changes.  No angina.  Normocytic anemia Hemoglobin is stable in the 10 g range.  No evidence of overt blood loss.  Chronic systolic CHF Last echocardiogram showed a EF of 40 to 45%.  Clinically euvolemic.  Lasix had been held for several days while hospitalized.  Now that her oral intake has improved, resumed home dose of low-dose Lasix with close monitoring of volume status and BMP as outpatient.  Essential hypertension Continue home medications.  Controlled.  Potassium abnormalities Initially hyperkalemic and then subsequently hypokalemic.  Supplement as needed.  Resolved.  History of dementia Patient apparently has significant dementia per family.  Reorient daily.  Delirium precautions.  Hypothyroidism TSH noted to be normal.  Continue levothyroxine.  History of breast cancer Seems to be stable.  Noted to be on anastrozole.  Patient on yearly follow-up with Dr. Lindi Adie, Oncology.  History of GERD Continue Pepcid.  Goals of care Palliative care is following and communicating with family.  At this time plan is for patient to go to long-term care with hospice. Palliative care team continued to follow closely.  DNR/DNI as previously documented.  Continue current medical care.  Plan is to return to [facility] with 24/7 supervision and [place] to follow at the facility.   Consultations: Cardiothoracic surgery.  Interventional radiology.  Pulmonology  Procedures: Right-sided chest tube placement   Discharge Instructions  Discharge Instructions    (HEART FAILURE PATIENTS) Call MD:  Anytime you have any of the following symptoms: 1) 3 pound weight gain in 24 hours or 5 pounds in 1 week 2) shortness of breath, with or without a dry hacking cough 3) swelling in the hands, feet or stomach 4) if you have to sleep on extra pillows at night in order to breathe.   Complete by: As directed    Call MD for:  difficulty breathing, headache or visual disturbances    Complete by: As directed    Call MD for:  extreme fatigue   Complete by: As directed    Call MD for:  persistant dizziness or light-headedness   Complete by: As directed    Call MD for:  persistant nausea and vomiting   Complete by: As directed    Call MD for:  severe uncontrolled pain   Complete by: As directed    Call MD for:  temperature >100.4   Complete by: As directed    Diet - low sodium heart healthy   Complete by: As directed    ST recs: Diet recommendations: Dysphagia 1 (puree);Nectar-thick liquid. Liquids provided via: Cup;Straw Medication Administration: Crushed with puree. Supervision: Patient able to self feed;Full supervision/cueing for compensatory strategies. Compensations: Minimize environmental distractions;Slow rate;Small sips/bites;Clear throat intermittently. Postural Changes and/or Swallow Maneuvers: Seated upright 90 degrees;Upright 30-60 min after meal   Increase activity slowly   Complete by: As directed    No wound care   Complete by: As directed        Medication List    STOP taking these medications   OXYGEN     TAKE these medications   acetaminophen 650 MG CR tablet Commonly known as: TYLENOL Take 1 tablet (650 mg total) by mouth every 12 (twelve) hours as needed for pain. What changed:   when to take this  reasons to take this   albuterol (2.5 MG/3ML) 0.083% nebulizer solution Commonly known as: PROVENTIL Take 3 mLs (2.5 mg total) by nebulization every 6 (six) hours as needed for wheezing or shortness of breath.   amoxicillin-clavulanate 500-125 MG tablet Commonly known as: AUGMENTIN Take 1 tablet (500 mg total) by mouth every 12 (twelve) hours for 1 day. Discontinue after Oct 02, 2020 doses.   anastrozole 1 MG tablet Commonly known as: ARIMIDEX Take 1 tablet (1 mg total) by mouth daily.   atorvastatin 40 MG tablet Commonly known as: LIPITOR Take 40 mg by mouth at bedtime.   budesonide-formoterol 80-4.5 MCG/ACT inhaler Commonly  known as: SYMBICORT Inhale 2 puffs into the lungs 2 (two) times daily.   CALCIUM CITRATE PETITE/VIT D PO Take 1 tablet by mouth in the morning.   carvedilol 3.125 MG tablet Commonly known as: COREG Take 3.125 mg by mouth 2 (two) times daily with a meal.   docusate sodium 100 MG capsule Commonly known as: COLACE Take 1 capsule (100 mg total) by mouth 2 (two) times daily.   famotidine 40 MG tablet Commonly known as: PEPCID Take 40 mg by mouth at bedtime.   furosemide 20 MG tablet Commonly known as: LASIX Take 20 mg by mouth in the morning.   gabapentin 100 MG capsule Commonly known as: NEURONTIN Take 200 mg by mouth 2 (two) times daily.   levothyroxine 25 MCG tablet Commonly known as: SYNTHROID Take 25 mcg by mouth daily before breakfast.   memantine 10 MG tablet Commonly known as: NAMENDA Take 1 tablet (10 mg total) by mouth 2 (two)  times daily.   mirabegron ER 25 MG Tb24 tablet Commonly known as: MYRBETRIQ Take 25 mg by mouth daily.   multivitamin with minerals Tabs tablet Take 1 tablet by mouth daily.   polyethylene glycol 17 g packet Commonly known as: MIRALAX / GLYCOLAX Take 17 g by mouth daily.   potassium chloride SA 20 MEQ tablet Commonly known as: KLOR-CON Take 20 mEq by mouth daily.   Resource ThickenUp Clear Powd Oral, As needed, other   saccharomyces boulardii 250 MG capsule Commonly known as: FLORASTOR Take 1 capsule (250 mg total) by mouth 2 (two) times daily for 15 days.   venlafaxine XR 150 MG 24 hr capsule Commonly known as: EFFEXOR-XR Take 150 mg by mouth daily with breakfast.   Vitamin D3 50 MCG (2000 UT) Tabs Take 2,000 Units by mouth in the morning.      No Known Allergies    Procedures/Studies: DG Chest 2 View  Result Date: 09/24/2020 CLINICAL DATA:  Recent pleural effusion. History of breast carcinoma EXAM: CHEST - 2 VIEW COMPARISON:  September 21, 2020. FINDINGS: PleurX catheter appears more peripherally position in the inferior  lateral right base region compared to recent study. No pneumothorax. The previously noted apparent loculated effusion on the right has largely resolved. A small amount of residual pleural effusion on the right with right base atelectasis is noted. There is a stable calcified granuloma in the medial right upper lobe. Lungs elsewhere are clear. Heart size and pulmonary vascularity are normal. No adenopathy. No bone lesions. IMPRESSION: PleurX catheter as described. Right pleural effusion much smaller with small residual right pleural effusion and right base atelectasis. No pneumothorax. Stable calcified granuloma medial right upper lobe. Lungs elsewhere clear. Heart size normal. Electronically Signed   By: Lowella Grip III M.D.   On: 09/24/2020 11:17   DG Chest 2 View  Result Date: 09/16/2020 CLINICAL DATA:  Lower right rib pain after a fall EXAM: CHEST - 2 VIEW COMPARISON:  09/27/2015 FINDINGS: Heart size and pulmonary vascularity are normal. Emphysematous changes and scattered fibrosis in the lungs. Calcified granuloma in the right upper lung. Infiltration in the right lower lung posteriorly with small right pleural effusion, likely pneumonia. In the setting of trauma, this could also indicate pulmonary contusion. No displaced rib fractures are identified although rib views were not obtained. IMPRESSION: 1. Emphysematous changes and fibrosis in the lungs. 2. Infiltration in the right lower lung with small right pleural effusion, likely pneumonia. Electronically Signed   By: Lucienne Capers M.D.   On: 09/16/2020 23:07   CT Head Wo Contrast  Result Date: 09/17/2020 CLINICAL DATA:  Mechanical fall. EXAM: CT HEAD WITHOUT CONTRAST CT CERVICAL SPINE WITHOUT CONTRAST TECHNIQUE: Multidetector CT imaging of the head and cervical spine was performed following the standard protocol without intravenous contrast. Multiplanar CT image reconstructions of the cervical spine were also generated. COMPARISON:  None.  FINDINGS: CT HEAD FINDINGS Brain: Diffuse cerebral atrophy. Ventricular dilatation likely due to central atrophy. Low-attenuation changes in the deep white matter consistent with small vessel ischemia. No mass-effect or midline shift. No abnormal extra-axial fluid collections. 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 Basal cisterns are not effaced. No acute intracranial hemorrhage. Vascular: Moderate intracranial arterial vascular calcifications. Skull: The calvarium appears intact. Sinuses/Orbits: Paranasal sinuses and mastoid air cells are clear. Other: None. CT CERVICAL SPINE FINDINGS Alignment: Normal alignment. Skull base and vertebrae: No acute fracture. No primary bone lesion or focal pathologic process.  Soft tissues and spinal canal: No prevertebral fluid or swelling. No visible canal hematoma. Disc levels: Degenerative changes with disc space narrowing and endplate hypertrophic changes throughout. Changes are most prominent at C4-5 and C5-6 levels. Prominent disc osteophyte complex at C5-6 level. Upper chest: Emphysematous changes and scarring in the lung apices. Other: None. IMPRESSION: 1. No acute intracranial abnormalities. Chronic atrophy and small vessel ischemic changes. 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 2. Normal alignment of the cervical spine. Degenerative changes. No acute displaced fractures identified. 3. Emphysematous changes and scarring in the lung apices. Emphysema (ICD10-J43.9). Electronically Signed   By: Lucienne Capers M.D.   On: 09/17/2020 00:29   CT CHEST WO CONTRAST  Result Date: 09/18/2020 CLINICAL DATA:  Abnormal chest radiograph, pleural effusion EXAM: CT CHEST WITHOUT CONTRAST TECHNIQUE: Multidetector CT imaging of the chest was performed following the standard protocol without IV contrast. COMPARISON:  Chest radiograph dated 09/18/2020. CTA chest dated 09/27/2015.  FINDINGS: Cardiovascular: The heart is top-normal in size. No pericardial effusion. No evidence of thoracic aortic aneurysm. Atherosclerotic calcifications of the aortic arch. Three vessel coronary atherosclerosis. Mediastinum/Nodes: Small mediastinal lymph nodes, likely reactive. Visualized thyroid is grossly unremarkable. Lungs/Pleura: Biapical pleural-parenchymal scarring. Moderate centrilobular emphysematous changes, upper lung predominant. Moderate right pleural effusion, loculated. Associated right lower lobe compressive atelectasis. No focal consolidation. No suspicious pulmonary nodules. No pneumothorax. Upper Abdomen: Visualized upper abdomen is notable for motion degradation, calcified splenic granulomata, and vascular calcifications. Musculoskeletal: Mild degenerative changes of the visualized thoracolumbar spine. IMPRESSION: Moderate right pleural effusion, loculated. Associated right lower lobe compressive atelectasis. Aortic Atherosclerosis (ICD10-I70.0) and Emphysema (ICD10-J43.9). Electronically Signed   By: Julian Hy M.D.   On: 09/18/2020 15:56   CT Cervical Spine Wo Contrast  Result Date: 09/17/2020 CLINICAL DATA:  Mechanical fall. EXAM: CT HEAD WITHOUT CONTRAST CT CERVICAL SPINE WITHOUT CONTRAST TECHNIQUE: Multidetector CT imaging of the head and cervical spine was performed following the standard protocol without intravenous contrast. Multiplanar CT image reconstructions of the cervical spine were also generated. COMPARISON:  None. FINDINGS: CT HEAD FINDINGS Brain: Diffuse cerebral atrophy. Ventricular dilatation likely due to central atrophy. Low-attenuation changes in the deep white matter consistent with small vessel ischemia. No mass-effect or midline shift. No abnormal extra-axial fluid collections. 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 Basal cisterns are not effaced. No acute intracranial hemorrhage. Vascular:  Moderate intracranial arterial vascular calcifications. Skull: The calvarium appears intact. Sinuses/Orbits: Paranasal sinuses and mastoid air cells are clear. Other: None. CT CERVICAL SPINE FINDINGS Alignment: Normal alignment. Skull base and vertebrae: No acute fracture. No primary bone lesion or focal pathologic process. Soft tissues and spinal canal: No prevertebral fluid or swelling. No visible canal hematoma. Disc levels: Degenerative changes with disc space narrowing and endplate hypertrophic changes throughout. Changes are most prominent at C4-5 and C5-6 levels. Prominent disc osteophyte complex at C5-6 level. Upper chest: Emphysematous changes and scarring in the lung apices. Other: None. IMPRESSION: 1. No acute intracranial abnormalities. Chronic atrophy and small vessel ischemic changes. 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 2. Normal alignment of the cervical spine. Degenerative changes. No acute displaced fractures identified. 3. Emphysematous changes and scarring in the lung apices. Emphysema (ICD10-J43.9). Electronically Signed   By: Lucienne Capers M.D.   On: 09/17/2020 00:29   DG Chest Port 1 View  Result Date: 09/26/2020 CLINICAL DATA:  Shortness of breath.  Chest tube removal. EXAM:  PORTABLE CHEST 1 VIEW COMPARISON:  09/25/2020. FINDINGS: Mediastinum and hilar structures normal. Mild right base atelectasis/infiltrate. No pleural effusion or pneumothorax. Stable mild left apical pleural thickening consistent scarring. Heart size normal. No acute bony abnormality. IMPRESSION: Mild right base atelectasis/infiltrate.  No pneumothorax. Electronically Signed   By: Marcello Moores  Register   On: 09/26/2020 07:20   DG Chest Port 1 View  Result Date: 09/25/2020 CLINICAL DATA:  Chest tube removal. EXAM: PORTABLE CHEST 1 VIEW COMPARISON:  No prior. FINDINGS: Mediastinum and hilar structures normal. Mild atelectasis/infiltrate right lung base. No pleural effusion or  pneumothorax. Oral contrast in the colon. Heart size normal. Degenerative changes scoliosis thoracic spine. IMPRESSION: Mild atelectasis right lung base.  No evidence of pneumothorax. Electronically Signed   By: Marcello Moores  Register   On: 09/25/2020 15:25   DG Chest Port 1 View  Result Date: 09/25/2020 CLINICAL DATA:  Status post chest tube removal today. EXAM: PORTABLE CHEST 1 VIEW COMPARISON:  Single-view of the chest earlier today. FINDINGS: Pigtail catheter in the right chest has been removed. No pneumothorax. Mild right basilar opacities are unchanged. The left lung is expanded and clear. Heart size is normal. Aortic atherosclerosis. IMPRESSION: Negative for pneumothorax after right chest tube removal. Electronically Signed   By: Inge Rise M.D.   On: 09/25/2020 13:20   DG CHEST PORT 1 VIEW  Result Date: 09/25/2020 CLINICAL DATA:  Pleural effusion.  Chest tube. EXAM: PORTABLE CHEST 1 VIEW COMPARISON:  09/24/2020. FINDINGS: Right chest tube in stable position. No pneumothorax. Mild right base atelectasis again noted. Stable calcification left upper lung most consistent granuloma. Tiny right pleural effusion cannot be excluded. Heart size normal. Mild right chest wall subcutaneous emphysema. IMPRESSION: Right chest tube in stable position. No pneumothorax. Mild right base atelectasis again noted. Tiny right pleural effusion cannot be excluded. Electronically Signed   By: Marcello Moores  Register   On: 09/25/2020 05:45   DG Chest Port 1 View  Result Date: 09/21/2020 CLINICAL DATA:  Right-sided chest tube.  Worsening chest pain. EXAM: PORTABLE CHEST 1 VIEW COMPARISON:  Earlier today at 0800 hours. FINDINGS: Right-sided small bore chest tube is unchanged in position. Midline trachea. Normal heart size. Atherosclerosis in the transverse aorta. A moderate amount of loculated pleural fluid may be slightly increased. No pneumothorax. No left-sided pleural fluid. Worsening right base aeration. Clear left lung.  IMPRESSION: Right-sided chest tube remains in place with minimal increase in right-sided loculated pleural fluid and adjacent basilar airspace disease. Aortic Atherosclerosis (ICD10-I70.0). Electronically Signed   By: Abigail Miyamoto M.D.   On: 09/21/2020 14:01   DG Chest Port 1 View  Result Date: 09/21/2020 CLINICAL DATA:  Chest tube in place.  Pleural effusion. EXAM: PORTABLE CHEST 1 VIEW COMPARISON:  September 20, 2020 chest radiograph; chest CT September 18, 2020 FINDINGS: Chest tube position unchanged on the right. No evident pneumothorax. Loculated pleural effusion on the right again noted with much smaller free-flowing component on the right. Left lung clear. Heart size and pulmonary vascularity normal. No adenopathy no bone lesions. IMPRESSION: Stable chest tube position on the right without evident pneumothorax. Area of loculated pleural effusion on the right persists, similar to 1 day prior with smaller free-flowing component on the right. No new opacity evident. Left lung clear. Stable cardiac silhouette. Electronically Signed   By: Lowella Grip III M.D.   On: 09/21/2020 08:52   DG CHEST PORT 1 VIEW  Result Date: 09/20/2020 CLINICAL DATA:  Right-sided chest tube. EXAM: PORTABLE CHEST 1 VIEW COMPARISON:  09/19/2020.  CT 09/18/2020. FINDINGS: Right chest tube noted over the right lower chest. Prominent rounded density, most likely loculated pleural fluid again noted over the right chest base. Tiny amount of free pleural air may be present over the right lung base at the site of chest tube insertion. No prominent pneumothorax noted. Follow-up chest x-ray suggested for continued evaluation. Calcified nodule right upper lobe most likely granuloma. Heart size stable. No acute bony abnormality. IMPRESSION: Right chest tube noted over the right lower chest. Prominent rounded density, most likely pleural fluid again noted over the right chest base. No significant interim change in appearance. Tiny amount of free  pleural air may be present over the right lung base at the site of chest tube insertion. No prominent pneumothorax noted. Follow-up chest x-ray suggested for continued evaluation. These results will be called to the ordering clinician or representative by the Radiologist Assistant, and communication documented in the PACS or [employer]. Electronically Signed   By: Marcello Moores  Register   On: 09/20/2020 12:06   DG CHEST PORT 1 VIEW  Result Date: 09/19/2020 CLINICAL DATA:  Shortness of breath, dyspnea. EXAM: PORTABLE CHEST 1 VIEW COMPARISON:  September 18, 2020. FINDINGS: The heart size and mediastinal contours are within normal limits. No pneumothorax is noted. Left lung is clear. Continued presence of loculated right pleural effusion is noted with associated right basilar atelectasis or infiltrate. The visualized skeletal structures are unremarkable. IMPRESSION: Continued presence of loculated right pleural effusion with associated right basilar atelectasis or infiltrate. Electronically Signed   By: Marijo Conception M.D.   On: 09/19/2020 08:11   DG Chest Port 1 View  Result Date: 09/18/2020 CLINICAL DATA:  Shortness of breath. EXAM: PORTABLE CHEST 1 VIEW COMPARISON:  09/16/2020. FINDINGS: Heart size normal. Right base infiltrate with moderate right pleural effusion. Calcified nodule right upper lung again noted consistent calcified granuloma. No pneumothorax. No acute bony abnormality. Splenic calcifications consistent granulomas again noted. IMPRESSION: Right base infiltrate with moderate right pleural effusion. Electronically Signed   By: Marcello Moores  Register   On: 09/18/2020 08:21   DG Swallowing Func-Speech Pathology  Result Date: 09/19/2020 Objective Swallowing Evaluation: Type of Study: MBS-Modified Barium Swallow Study  Patient Details Name: Crystan Addesso MRN: [id number] Date of Birth: Jul 08, 1940 Today's Date: 09/19/2020 Time: SLP Start Time (ACUTE ONLY): [id number] -SLP Stop Time (ACUTE ONLY): 1327 SLP Time  Calculation (min) (ACUTE ONLY): 21 min Past Medical History: Past Medical History: Diagnosis Date . Anxiety  . Arthritis  . Cancer [facility])   breast CA 2002 . CHF (congestive heart failure) ([place])  . COPD (chronic obstructive pulmonary disease) ([place])  . Dementia ([place])  . Hypercholesterolemia  . Hyperthyroidism  . Osteopenia  Past Surgical History: Past Surgical History: Procedure Laterality Date . ABDOMINAL HYSTERECTOMY   . APPENDECTOMY   . BACK SURGERY   . BREAST SURGERY   . MASTECTOMY W/ SENTINEL NODE BIOPSY Left 02/26/2016  Procedure: LEFT MASTECTOMY WITH SENTINEL LYMPH NODE BIOPSY;  Surgeon: Coralie Keens, MD;  Location: [place];  Service: General;  Laterality: Left; HPI: Pt is a 80 yo female presenting with SOB, admitted with sepsis secondary to R empyema with respiratory failure. CXR concerning for R-sided PNA with moderate pleural effusion. Family reported coughing while eating and talking. PMH includes: dementia, COPD, systolic CHF, hypothyroidism, breast ca s/p L mastectomy, recent fall  Subjective: alert, pleasant, needs repetition Assessment / Plan / Recommendation CHL IP CLINICAL IMPRESSIONS 09/19/2020 Clinical Impression Pt presents with a pharyngeal more than oral dysphagia  with decreased airway protection across all liquids. She has reduced anterior hyoid movement, base of tongue retraction, and epiglottic inversion that results in airway invasion during the swallow, but also leaves increasing amounts of pharyngeal residue as boluses become thicker. Thin liquids are aspirated silently, and cannot be cleared with a cued cough. A chin tuck does NOT prevent aspiration. With nectar thick liquids she penetrates above the vocal folds under certain conditions, such as when she takes straw sips, large volumes via cup, or if her valleculae is full of nectar-thick residue that spills over the epiglottis as she is working on more prolonged mastication of solids. This penetration clears more readily with a cued cough  or throat clear, and the remaining cup sips are consumed without entering the airway. Honey thick liquids are aspirated, perhaps in part due to the posterior head tilt that she uses to access them from the cup. Recommend starting wtih Dys 2 (finely chopped) foods and nectar thick liquids by cup. Would encourage small, single sips and intermittent throat clears, although anticipate that pt will need assistance for reinforcement.  SLP Visit Diagnosis Dysphagia, oropharyngeal phase (R13.12) Attention and concentration deficit following -- Frontal lobe and executive function deficit following -- Impact on safety and function Moderate aspiration risk   CHL IP TREATMENT RECOMMENDATION 09/19/2020 Treatment Recommendations Therapy as outlined in treatment plan below   Prognosis 09/19/2020 Prognosis for Safe Diet Advancement Fair Barriers to Reach Goals Cognitive deficits Barriers/Prognosis Comment -- CHL IP DIET RECOMMENDATION 09/19/2020 SLP Diet Recommendations Dysphagia 2 (Fine chop) solids;Nectar thick liquid Liquid Administration via Cup;No straw Medication Administration Crushed with puree Compensations Minimize environmental distractions;Slow rate;Small sips/bites;Clear throat intermittently Postural Changes Remain semi-upright after after feeds/meals (Comment);Seated upright at 90 degrees   CHL IP OTHER RECOMMENDATIONS 09/19/2020 Recommended Consults -- Oral Care Recommendations Oral care BID Other Recommendations Order thickener from pharmacy;Prohibited food (jello, ice cream, thin soups);Remove water pitcher   CHL IP FOLLOW UP RECOMMENDATIONS 09/19/2020 Follow up Recommendations Skilled Nursing facility   [facility] IP FREQUENCY AND DURATION 09/19/2020 Speech Therapy Frequency (ACUTE ONLY) min 2x/week Treatment Duration 2 weeks      CHL IP ORAL PHASE 09/19/2020 Oral Phase Impaired Oral - Pudding Teaspoon -- Oral - Pudding Cup -- Oral - Honey Teaspoon -- Oral - Honey Cup Lingual/palatal residue Oral - Nectar Teaspoon -- Oral -  Nectar Cup WFL Oral - Nectar Straw WFL Oral - Thin Teaspoon -- Oral - Thin Cup WFL Oral - Thin Straw WFL Oral - Puree WFL Oral - Mech Soft Impaired mastication;Lingual/palatal residue Oral - Regular -- Oral - Multi-Consistency -- Oral - Pill -- Oral Phase - Comment --  CHL IP PHARYNGEAL PHASE 09/19/2020 Pharyngeal Phase -- Pharyngeal- Pudding Teaspoon -- Pharyngeal -- Pharyngeal- Pudding Cup -- Pharyngeal -- Pharyngeal- Honey Teaspoon -- Pharyngeal -- Pharyngeal- Honey Cup Reduced epiglottic inversion;Reduced anterior laryngeal mobility;Reduced tongue base retraction;Pharyngeal residue - valleculae;Penetration/Aspiration during swallow;Lateral channel residue Pharyngeal Material enters airway, passes BELOW cords then ejected out Pharyngeal- Nectar Teaspoon -- Pharyngeal -- Pharyngeal- Nectar Cup Reduced epiglottic inversion;Reduced anterior laryngeal mobility;Reduced tongue base retraction;Pharyngeal residue - valleculae;Penetration/Apiration after swallow;Penetration/Aspiration during swallow Pharyngeal Material enters airway, remains ABOVE vocal cords and not ejected out Pharyngeal- Nectar Straw Reduced epiglottic inversion;Reduced anterior laryngeal mobility;Reduced tongue base retraction;Pharyngeal residue - valleculae;Lateral channel residue;Penetration/Aspiration during swallow Pharyngeal Material enters airway, remains ABOVE vocal cords and not ejected out Pharyngeal- Thin Teaspoon -- Pharyngeal -- Pharyngeal- Thin Cup Reduced epiglottic inversion;Reduced anterior laryngeal mobility;Reduced tongue base retraction;Pharyngeal residue - valleculae;Penetration/Aspiration during swallow  Pharyngeal Material enters airway, passes BELOW cords without attempt by patient to eject out (silent aspiration) Pharyngeal- Thin Straw Reduced epiglottic inversion;Reduced anterior laryngeal mobility;Reduced tongue base retraction;Pharyngeal residue - valleculae;Compensatory strategies attempted (with  notebox);Penetration/Aspiration during swallow Pharyngeal Material enters airway, passes BELOW cords without attempt by patient to eject out (silent aspiration) Pharyngeal- Puree Reduced epiglottic inversion;Reduced anterior laryngeal mobility;Reduced tongue base retraction;Pharyngeal residue - valleculae Pharyngeal -- Pharyngeal- Mechanical Soft Reduced epiglottic inversion;Reduced anterior laryngeal mobility;Reduced tongue base retraction;Pharyngeal residue - valleculae Pharyngeal -- Pharyngeal- Regular -- Pharyngeal -- Pharyngeal- Multi-consistency -- Pharyngeal -- Pharyngeal- Pill -- Pharyngeal -- Pharyngeal Comment --  CHL IP CERVICAL ESOPHAGEAL PHASE 09/19/2020 Cervical Esophageal Phase Impaired Pudding Teaspoon -- Pudding Cup -- Honey Teaspoon -- Honey Cup Reduced cricopharyngeal relaxation Nectar Teaspoon -- Nectar Cup Reduced cricopharyngeal relaxation Nectar Straw Reduced cricopharyngeal relaxation Thin Teaspoon -- Thin Cup Reduced cricopharyngeal relaxation Thin Straw Reduced cricopharyngeal relaxation Puree Reduced cricopharyngeal relaxation Mechanical Soft Reduced cricopharyngeal relaxation Regular -- Multi-consistency -- Pill -- Cervical Esophageal Comment -- Osie Bond., M.A. CCC-SLP Acute Rehabilitation Services Pager [phone] Office [phone] 09/19/2020, 3:03 PM              IR PERC PLEURAL DRAIN W/INDWELL CATH W/IMG GUIDE  Result Date: 09/20/2020 INDICATION: Loculated right pleural effusion EXAM: ULTRASOUND AND FLUOROSCOPIC 10 FRENCH RIGHT CHEST TUBE INSERTION MEDICATIONS: The patient is currently admitted to the hospital and receiving intravenous antibiotics. The antibiotics were administered within an appropriate time frame prior to the initiation of the procedure. ANESTHESIA/SEDATION: Fentanyl 25 mcg IV; Versed 5 0 mg IV Moderate Sedation Time:  NONE. The patient was continuously monitored during the procedure by the interventional radiology nurse under my direct supervision.  COMPLICATIONS: None immediate. PROCEDURE: Informed written consent was obtained from the PATIENT'S SON after a thorough discussion of the procedural risks, benefits and alternatives. All questions were addressed. Maximal Sterile Barrier Technique was utilized including caps, mask, sterile gowns, sterile gloves, sterile drape, hand hygiene and skin antiseptic. A timeout was performed prior to the initiation of the procedure. Previous imaging reviewed. Patient positioned right anterior oblique. Complex septated loculated right effusion was localized in the mid axillary line through a lower intercostal space. Under sterile conditions and local anesthesia, an 18 gauge 10 cm access needle was advanced through a lower intercostal space under direct ultrasound into the effusion. Needle position confirmed with ultrasound. Images obtained for documentation. Guidewire inserted followed by tract dilatation to insert a 10 [place] drain. Drain catheter position confirmed with ultrasound and fluoroscopy. Images obtained for documentation. Serosanguineous yellow pleural fluid aspirated. Sample sent for laboratory analysis and culture. Catheter secured with a Prolene suture and connected to external pleura vac. Sterile dressing applied. No immediate complication. Patient tolerated the procedure well. IMPRESSION: Successful ultrasound and fluoroscopic 10 French right chest tube insertion. Electronically Signed   By: Jerilynn Mages.  Shick M.D.   On: 09/20/2020 08:27      Subjective: Patient interviewed and examined along with her female nurse in the room.  Patient denied complaints.  Specifically denies dyspnea or chest pain.  As per RN, no acute issues noted and has not been hypoxic including overnight while on room air.  Discharge Exam:  Vitals:   10/01/20 0811 10/01/20 0904 10/01/20 0906 10/01/20 0910  BP: (!) 109/53   120/67  Pulse: 89 91  94  Resp: 16 16  16   Temp: 97.7 F (36.5 C)   98 F (36.7 C)  TempSrc: Oral   Oral   SpO2: 96% 96% 96%  95%  Weight:      Height:        General exam: Pleasant elderly female, moderately built and nourished sitting up comfortably in bed having breakfast by herself. Respiratory system:  Slightly diminished breath sounds in the bases but rest of lung fields clear to auscultation without wheezing, rhonchi or crackles.  No increased work of breathing. Cardiovascular system: S1 and S2 heard, RRR.  No JVD, murmurs or pedal edema.  Telemetry personally reviewed: Sinus rhythm. Gastrointestinal system: Abdomen is nondistended, soft and nontender. No organomegaly or masses felt. Normal bowel sounds heard. Central nervous system: Alert and oriented only to self and place. No focal neurological deficits. Extremities: Symmetric 5 x 5 power. Skin: No rashes, lesions or ulcers Psychiatry: Judgement and insight impaired. Mood & affect pleasant and appropriate.    The results of significant diagnostics from this hospitalization (including imaging, microbiology, ancillary and laboratory) are listed below for reference.     Microbiology: Recent Results (from the past 240 hour(s))  Urine Culture     Status: Abnormal   Collection Time: 09/23/20  6:56 PM   Specimen: Urine, Random  Result Value Ref Range Status   Specimen Description URINE, RANDOM  Final   Special Requests   Final    Unasyn Normal Performed at [facility], [address], [place], [place] [address]    Culture 10,000 COLONIES/mL YEAST (A)  Final   Report Status 09/25/2020 FINAL  Final     Labs: CBC: Recent Labs  Lab 09/25/20 0549 09/26/20 0448 09/27/20 0257 09/28/20 0435 09/29/20 0244  WBC 17.1* 18.4* 16.8* 16.0* 16.3*  NEUTROABS  --   --  13.6*  --   --   HGB 10.2* 10.5* 10.5* 10.3* 10.7*  HCT 32.3* 31.9* 32.9* 31.9* 33.6*  MCV 92.0 89.4 90.9 90.4 93.3  PLT 322 325 329 298 [id number]    Basic Metabolic Panel: Recent Labs  Lab 09/25/20 0549 09/26/20 0448 09/28/20 0435 09/29/20 0244  NA 135 137 136  137  K 3.9 3.6 3.3* 3.9  CL 99 101 98 103  CO2 29 28 32 27  GLUCOSE 87 51* 75 121*  BUN 25* 20 8 11   CREATININE 1.10* 1.00 0.80 0.71  CALCIUM 8.1* 8.1* 8.0* 8.0*    Liver Function Tests: No results for input(s): AST, ALT, ALKPHOS, BILITOT, PROT, ALBUMIN in the last 168 hours.  CBG: Recent Labs  Lab 09/30/20 1508 09/30/20 2007 10/01/20 0006 10/01/20 0428 10/01/20 0816  GLUCAP 97 128* 113* 87 120*    I discussed in detail with patient's son via phone, updated care and answered all questions and advised him that patient is being discharged to nursing facility today.  He was appreciative of the call and agreeable to the plan.  Time coordinating discharge: 45 minutes  SIGNED:  Vernell Leep, MD, [place], [facility]. [facility]  To contact the attending provider between 7A-7P or the covering provider during after hours 7P-7A, please log into the web site [email or web address] and access using universal [place] password for that web site. If you do not have the password, please call the hospital operator.

## 2020-10-16 ENCOUNTER — Ambulatory Visit (INDEPENDENT_AMBULATORY_CARE_PROVIDER_SITE_OTHER): Payer: Medicare Other

## 2020-10-16 ENCOUNTER — Ambulatory Visit (INDEPENDENT_AMBULATORY_CARE_PROVIDER_SITE_OTHER): Payer: Medicare Other | Admitting: Primary Care

## 2020-10-16 ENCOUNTER — Other Ambulatory Visit: Payer: Self-pay

## 2020-10-16 ENCOUNTER — Encounter: Payer: Self-pay | Admitting: Primary Care

## 2020-10-16 VITALS — BP 118/70 | HR 100 | Temp 97.2°F | Ht 62.0 in | Wt 106.0 lb

## 2020-10-16 DIAGNOSIS — J690 Pneumonitis due to inhalation of food and vomit: Secondary | ICD-10-CM | POA: Diagnosis not present

## 2020-10-16 DIAGNOSIS — J449 Chronic obstructive pulmonary disease, unspecified: Secondary | ICD-10-CM

## 2020-10-16 DIAGNOSIS — J90 Pleural effusion, not elsewhere classified: Secondary | ICD-10-CM | POA: Diagnosis not present

## 2020-10-16 DIAGNOSIS — G4734 Idiopathic sleep related nonobstructive alveolar hypoventilation: Secondary | ICD-10-CM | POA: Diagnosis not present

## 2020-10-16 NOTE — Assessment & Plan Note (Signed)
-   Oxygen was discontinued during most recent hospital stay, recommend checking overnight oximetry to ensure that she does not need nocturnal oxygen

## 2020-10-16 NOTE — Assessment & Plan Note (Addendum)
-   Treated with prolonged course of antibiotics. Completed Augmentin on 10/02/20  - Speech and swallow recommended Dys 1 (pureed) solids and nectar thick liquids with full supervision to reduce risk as much as possible. Given level of dysphagia and cognitive impairments, risk may not be fully eliminated. Per palliative care notes, family is more interested in pursuing hospice care and would not want artificial feeding

## 2020-10-16 NOTE — Patient Instructions (Signed)
Ensure patient is taking Symbicort 10mcg two puffs morning and evening Continue Albuterol 2 puffs every 6 hours as needed for shortness of breth/wheezing Continue Lasix 20mg  daily Checking over night oximetry on room air re: nocturnal hypoxia Chest Xray pending, right pleural effusion looks improved Follow-up 3-6 months with Dr. Lamonte Sakai or Erin Fulling

## 2020-10-16 NOTE — Assessment & Plan Note (Addendum)
-   Does not appear exacerbated today - Resume Symbicort 86mcg two puff twice daily

## 2020-10-16 NOTE — Assessment & Plan Note (Addendum)
-   Hospitalized 09/18/20-10/01/20 for acute respiratory failure and right pleural effusion s/p chest tube placement which was later discontinued. She is doing well today, no acute respiratory complaints - CXR today showed no recurrence in right sided pleural effusion, she has some chronic changes of pleural parenchymal scarring particularly on the right

## 2020-10-16 NOTE — Progress Notes (Signed)
@Patient  ID: [name], female    DOB: [birth year], 80 y.o.   MRN: [id number]  Chief Complaint  Patient presents with  . Follow-up    Admitted in the hospital on 09/18/20.    Referring provider: [name], MD  HPI: 80 year old female, former smoker quit in September 1995.  Past medical history significant for CHF, pulmonary embolism, COPD, emphysema, pleural effusion, acute respiratory failure, [place], history of breast cancer, hyperlipidemia.  Former patient of Dr. Lamonte Sakai, in October 2017.    Patient was recently admitted from 09/18/2020 to 10/01/2020 for acute respiratory failure and loculated right pleural effusion.  Lives in memory care unit at [facility].  Cardiothoracic surgery was consulted and thought not to be surgical candidate.  Evaluated by IR, patient underwent chest tube placement on 4/27.  Gram stain did not show any organisms.  Neuro fluid culture from 4/28 final report was negative.  She was treated with several antibiotics including IV cefepime, azithromycin, ceftriaxone, vancomycin, Unasyn.  Biotic regimen was changed to Augmentin which was completed between 5/3-5/10.  Prolactin level improved to 0.49.  Leukocytosis gradually improving and stable at 16 for several days.  Chest tube was discontinued on 5/3.  Weaned to room air.  Patient reportedly wears 2 L oxygen at night for nocturnal hypoxia.    10/16/2020- Interim hx  Presents today for hospital follow-up/CXR to monitor right pleural effusion. Patient lives in memory care unit, she is alone today. She is feeling ok, states "I used to have a much harder time breathing but im better now". She can get out of breath walking or being active. She ambulates with a rolling walker. She does not think that she has been using her Symbicort inhaler. She does not have any significant cough. She reports that she used to be on oxygen but not anymore. Denies f/c/s, cough, chest pain or tightness.    No Known Allergies  Immunization  History  Administered Date(s) Administered  . Influenza, High Dose Seasonal PF 02/08/2016    Past Medical History:  Diagnosis Date  . Anxiety   . Arthritis   . Cancer [facility])    breast CA 2002  . CHF (congestive heart failure) ([place])   . COPD (chronic obstructive pulmonary disease) ([place])   . Dementia ([place])   . Hypercholesterolemia   . Hyperthyroidism   . Osteopenia     Tobacco History: Social History   Tobacco Use  Smoking Status Former Smoker  . Quit date: 02/03/1994  . Years since quitting: 26.7  Smokeless Tobacco Never Used   Counseling given: Not Answered   Outpatient Medications Prior to Visit  Medication Sig Dispense Refill  . acetaminophen (TYLENOL) 650 MG CR tablet Take 1 tablet (650 mg total) by mouth every 12 (twelve) hours as needed for pain.    Marland Kitchen albuterol (PROVENTIL) (2.5 MG/3ML) 0.083% nebulizer solution Take 3 mLs (2.5 mg total) by nebulization every 6 (six) hours as needed for wheezing or shortness of breath.    . anastrozole (ARIMIDEX) 1 MG tablet Take 1 tablet (1 mg total) by mouth daily. 90 tablet 3  . atorvastatin (LIPITOR) 40 MG tablet Take 40 mg by mouth at bedtime.    . budesonide-formoterol (SYMBICORT) 80-4.5 MCG/ACT inhaler Inhale 2 puffs into the lungs 2 (two) times daily.    . Calcium Citrate-Vitamin D (CALCIUM CITRATE PETITE/VIT D PO) Take 1 tablet by mouth in the morning.    . carvedilol (COREG) 3.125 MG tablet Take 3.125 mg by mouth 2 (two) times  daily with a meal.    . Cholecalciferol (VITAMIN D3) 50 MCG (2000 UT) TABS Take 2,000 Units by mouth in the morning.    . docusate sodium (COLACE) 100 MG capsule Take 1 capsule (100 mg total) by mouth 2 (two) times daily.    . famotidine (PEPCID) 40 MG tablet Take 40 mg by mouth at bedtime.    . furosemide (LASIX) 20 MG tablet Take 20 mg by mouth in the morning.    . gabapentin (NEURONTIN) 100 MG capsule Take 200 mg by mouth 2 (two) times daily.     Marland Kitchen levothyroxine (SYNTHROID, LEVOTHROID) 25 MCG tablet  Take 25 mcg by mouth daily before breakfast.     . Maltodextrin-Xanthan Gum (RESOURCE THICKENUP CLEAR) POWD Oral, As needed, other    . memantine (NAMENDA) 10 MG tablet Take 1 tablet (10 mg total) by mouth 2 (two) times daily. 60 tablet 12  . mirabegron ER (MYRBETRIQ) 25 MG TB24 tablet Take 25 mg by mouth daily.    . Multiple Vitamin (MULTIVITAMIN WITH MINERALS) TABS tablet Take 1 tablet by mouth daily.    . polyethylene glycol (MIRALAX / GLYCOLAX) 17 g packet Take 17 g by mouth daily.    . potassium chloride SA (K-DUR,KLOR-CON) 20 MEQ tablet Take 20 mEq by mouth daily.     Marland Kitchen saccharomyces boulardii (FLORASTOR) 250 MG capsule Take 1 capsule (250 mg total) by mouth 2 (two) times daily for 15 days.    Marland Kitchen venlafaxine XR (EFFEXOR-XR) 150 MG 24 hr capsule Take 150 mg by mouth daily with breakfast.     No facility-administered medications prior to visit.   Review of Systems  Review of Systems  Constitutional: Negative for diaphoresis, fatigue and fever.  HENT: Negative.   Respiratory: Negative for cough, chest tightness, shortness of breath and wheezing.        Dyspnea on exertion  Cardiovascular: Negative.   Psychiatric/Behavioral: Negative.    Physical Exam  BP 118/70 (BP Location: Left Arm, Patient Position: Sitting, Cuff Size: Normal)   Pulse 100   Temp (!) 97.2 F (36.2 C) (Temporal)   Ht 5\' 2"  (1.575 m)   Wt 106 lb (48.1 kg)   SpO2 94%   BMI 19.39 kg/m  Physical Exam Constitutional:      Appearance: Normal appearance.     Comments: Well appearing   HENT:     Head: Normocephalic and atraumatic.  Cardiovascular:     Rate and Rhythm: Normal rate and regular rhythm.     Comments: No edema Pulmonary:     Effort: Pulmonary effort is normal.     Breath sounds: Normal breath sounds.     Comments: O2 94% RA Musculoskeletal:     Comments: Right leg brace. Amb with rolling walker  Neurological:     Mental Status: She is alert. Mental status is at baseline.     Comments: Hx  dementia, severe  Psychiatric:        Mood and Affect: Mood normal.        Behavior: Behavior normal.      Lab Results:  CBC    Component Value Date/Time   WBC 16.3 (H) 09/29/2020 0244   RBC 3.60 (L) 09/29/2020 0244   HGB 10.7 (L) 09/29/2020 0244   HCT 33.6 (L) 09/29/2020 0244   PLT 332 09/29/2020 0244   MCV 93.3 09/29/2020 0244   MCH 29.7 09/29/2020 0244   MCHC 31.8 09/29/2020 0244   RDW 13.4 09/29/2020 0244   LYMPHSABS 1.5  09/27/2020 0257   MONOABS 1.2 (H) 09/27/2020 0257   EOSABS 0.2 09/27/2020 0257   BASOSABS 0.0 09/27/2020 0257    BMET    Component Value Date/Time   NA 137 09/29/2020 0244   K 3.9 09/29/2020 0244   CL 103 09/29/2020 0244   CO2 27 09/29/2020 0244   GLUCOSE 121 (H) 09/29/2020 0244   BUN 11 09/29/2020 0244   CREATININE 0.71 09/29/2020 0244   CALCIUM 8.0 (L) 09/29/2020 0244   GFRNONAA >60 09/29/2020 0244   GFRAA >60 02/18/2016 1048    BNP    Component Value Date/Time   BNP 296.9 (H) 09/18/2020 0802    ProBNP    Component Value Date/Time   PROBNP 5,930.0 (H) 07/10/2013 1921    Imaging: DG Chest 2 View  Result Date: 10/16/2020 CLINICAL DATA:  Follow-up pleural effusion. EXAM: CHEST - 2 VIEW COMPARISON:  09/26/2020.  09/21/2020. FINDINGS: Mediastinum and hilar structures normal. Heart size normal. No pulmonary venous congestion. Stable bilateral upper lobe pleural-parenchymal thickening consistent scarring. Stable right base pleural thickening consistent with scarring. Stable calcified nodule right upper lung consistent with granulomas. No acute bony abnormality. Calcifications noted over the spleen consistent with calcified granulomas. IMPRESSION: Stable changes of pleural-parenchymal scarring, particularly of the right lung base. Pleural thickening noted over the right lung base is stable and is most likely related to pleural scarring. Electronically Signed   By: Marcello Moores  Register   On: 10/16/2020 10:52   DG Chest 2 View  Result Date:  09/24/2020 CLINICAL DATA:  Recent pleural effusion. History of breast carcinoma EXAM: CHEST - 2 VIEW COMPARISON:  September 21, 2020. FINDINGS: PleurX catheter appears more peripherally position in the inferior lateral right base region compared to recent study. No pneumothorax. The previously noted apparent loculated effusion on the right has largely resolved. A small amount of residual pleural effusion on the right with right base atelectasis is noted. There is a stable calcified granuloma in the medial right upper lobe. Lungs elsewhere are clear. Heart size and pulmonary vascularity are normal. No adenopathy. No bone lesions. IMPRESSION: PleurX catheter as described. Right pleural effusion much smaller with small residual right pleural effusion and right base atelectasis. No pneumothorax. Stable calcified granuloma medial right upper lobe. Lungs elsewhere clear. Heart size normal. Electronically Signed   By: Lowella Grip III M.D.   On: 09/24/2020 11:17   DG Chest 2 View  Result Date: 09/16/2020 CLINICAL DATA:  Lower right rib pain after a fall EXAM: CHEST - 2 VIEW COMPARISON:  09/27/2015 FINDINGS: Heart size and pulmonary vascularity are normal. Emphysematous changes and scattered fibrosis in the lungs. Calcified granuloma in the right upper lung. Infiltration in the right lower lung posteriorly with small right pleural effusion, likely pneumonia. In the setting of trauma, this could also indicate pulmonary contusion. No displaced rib fractures are identified although rib views were not obtained. IMPRESSION: 1. Emphysematous changes and fibrosis in the lungs. 2. Infiltration in the right lower lung with small right pleural effusion, likely pneumonia. Electronically Signed   By: Lucienne Capers M.D.   On: 09/16/2020 23:07   CT Head Wo Contrast  Result Date: 09/17/2020 CLINICAL DATA:  Mechanical fall. EXAM: CT HEAD WITHOUT CONTRAST CT CERVICAL SPINE WITHOUT CONTRAST TECHNIQUE: Multidetector CT imaging of  the head and cervical spine was performed following the standard protocol without intravenous contrast. Multiplanar CT image reconstructions of the cervical spine were also generated. COMPARISON:  None. FINDINGS: CT HEAD FINDINGS Brain: Diffuse cerebral atrophy. Ventricular dilatation  likely due to central atrophy. Low-attenuation changes in the deep white matter consistent with small vessel ischemia. No mass-effect or midline shift. No abnormal extra-axial fluid collections. 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 Basal cisterns are not effaced. No acute intracranial hemorrhage. Vascular: Moderate intracranial arterial vascular calcifications. Skull: The calvarium appears intact. Sinuses/Orbits: Paranasal sinuses and mastoid air cells are clear. Other: None. CT CERVICAL SPINE FINDINGS Alignment: Normal alignment. Skull base and vertebrae: No acute fracture. No primary bone lesion or focal pathologic process. Soft tissues and spinal canal: No prevertebral fluid or swelling. No visible canal hematoma. Disc levels: Degenerative changes with disc space narrowing and endplate hypertrophic changes throughout. Changes are most prominent at C4-5 and C5-6 levels. Prominent disc osteophyte complex at C5-6 level. Upper chest: Emphysematous changes and scarring in the lung apices. Other: None. IMPRESSION: 1. No acute intracranial abnormalities. Chronic atrophy and small vessel ischemic changes. 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 2. Normal alignment of the cervical spine. Degenerative changes. No acute displaced fractures identified. 3. Emphysematous changes and scarring in the lung apices. Emphysema (ICD10-J43.9). Electronically Signed   By: Lucienne Capers M.D.   On: 09/17/2020 00:29   CT CHEST WO CONTRAST  Result Date: 09/18/2020 CLINICAL DATA:  Abnormal chest radiograph, pleural effusion EXAM: CT CHEST WITHOUT  CONTRAST TECHNIQUE: Multidetector CT imaging of the chest was performed following the standard protocol without IV contrast. COMPARISON:  Chest radiograph dated 09/18/2020. CTA chest dated 09/27/2015. FINDINGS: Cardiovascular: The heart is top-normal in size. No pericardial effusion. No evidence of thoracic aortic aneurysm. Atherosclerotic calcifications of the aortic arch. Three vessel coronary atherosclerosis. Mediastinum/Nodes: Small mediastinal lymph nodes, likely reactive. Visualized thyroid is grossly unremarkable. Lungs/Pleura: Biapical pleural-parenchymal scarring. Moderate centrilobular emphysematous changes, upper lung predominant. Moderate right pleural effusion, loculated. Associated right lower lobe compressive atelectasis. No focal consolidation. No suspicious pulmonary nodules. No pneumothorax. Upper Abdomen: Visualized upper abdomen is notable for motion degradation, calcified splenic granulomata, and vascular calcifications. Musculoskeletal: Mild degenerative changes of the visualized thoracolumbar spine. IMPRESSION: Moderate right pleural effusion, loculated. Associated right lower lobe compressive atelectasis. Aortic Atherosclerosis (ICD10-I70.0) and Emphysema (ICD10-J43.9). Electronically Signed   By: Julian Hy M.D.   On: 09/18/2020 15:56   CT Cervical Spine Wo Contrast  Result Date: 09/17/2020 CLINICAL DATA:  Mechanical fall. EXAM: CT HEAD WITHOUT CONTRAST CT CERVICAL SPINE WITHOUT CONTRAST TECHNIQUE: Multidetector CT imaging of the head and cervical spine was performed following the standard protocol without intravenous contrast. Multiplanar CT image reconstructions of the cervical spine were also generated. COMPARISON:  None. FINDINGS: CT HEAD FINDINGS Brain: Diffuse cerebral atrophy. Ventricular dilatation likely due to central atrophy. Low-attenuation changes in the deep white matter consistent with small vessel ischemia. No mass-effect or midline shift. No abnormal extra-axial  fluid collections. 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 Basal cisterns are not effaced. No acute intracranial hemorrhage. Vascular: Moderate intracranial arterial vascular calcifications. Skull: The calvarium appears intact. Sinuses/Orbits: Paranasal sinuses and mastoid air cells are clear. Other: None. CT CERVICAL SPINE FINDINGS Alignment: Normal alignment. Skull base and vertebrae: No acute fracture. No primary bone lesion or focal pathologic process. Soft tissues and spinal canal: No prevertebral fluid or swelling. No visible canal hematoma. Disc levels: Degenerative changes with disc space narrowing and endplate hypertrophic changes throughout. Changes are most prominent at C4-5 and C5-6 levels. Prominent disc osteophyte complex at C5-6 level. Upper chest:  Emphysematous changes and scarring in the lung apices. Other: None. IMPRESSION: 1. No acute intracranial abnormalities. Chronic atrophy and small vessel ischemic changes. 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 2. Normal alignment of the cervical spine. Degenerative changes. No acute displaced fractures identified. 3. Emphysematous changes and scarring in the lung apices. Emphysema (ICD10-J43.9). Electronically Signed   By: Lucienne Capers M.D.   On: 09/17/2020 00:29   DG Chest Port 1 View  Result Date: 09/26/2020 CLINICAL DATA:  Shortness of breath.  Chest tube removal. EXAM: PORTABLE CHEST 1 VIEW COMPARISON:  09/25/2020. FINDINGS: Mediastinum and hilar structures normal. Mild right base atelectasis/infiltrate. No pleural effusion or pneumothorax. Stable mild left apical pleural thickening consistent scarring. Heart size normal. No acute bony abnormality. IMPRESSION: Mild right base atelectasis/infiltrate.  No pneumothorax. Electronically Signed   By: Marcello Moores  Register   On: 09/26/2020 07:20   DG Chest Port 1 View  Result Date: 09/25/2020 CLINICAL  DATA:  Chest tube removal. EXAM: PORTABLE CHEST 1 VIEW COMPARISON:  No prior. FINDINGS: Mediastinum and hilar structures normal. Mild atelectasis/infiltrate right lung base. No pleural effusion or pneumothorax. Oral contrast in the colon. Heart size normal. Degenerative changes scoliosis thoracic spine. IMPRESSION: Mild atelectasis right lung base.  No evidence of pneumothorax. Electronically Signed   By: Marcello Moores  Register   On: 09/25/2020 15:25   DG Chest Port 1 View  Result Date: 09/25/2020 CLINICAL DATA:  Status post chest tube removal today. EXAM: PORTABLE CHEST 1 VIEW COMPARISON:  Single-view of the chest earlier today. FINDINGS: Pigtail catheter in the right chest has been removed. No pneumothorax. Mild right basilar opacities are unchanged. The left lung is expanded and clear. Heart size is normal. Aortic atherosclerosis. IMPRESSION: Negative for pneumothorax after right chest tube removal. Electronically Signed   By: Inge Rise M.D.   On: 09/25/2020 13:20   DG CHEST PORT 1 VIEW  Result Date: 09/25/2020 CLINICAL DATA:  Pleural effusion.  Chest tube. EXAM: PORTABLE CHEST 1 VIEW COMPARISON:  09/24/2020. FINDINGS: Right chest tube in stable position. No pneumothorax. Mild right base atelectasis again noted. Stable calcification left upper lung most consistent granuloma. Tiny right pleural effusion cannot be excluded. Heart size normal. Mild right chest wall subcutaneous emphysema. IMPRESSION: Right chest tube in stable position. No pneumothorax. Mild right base atelectasis again noted. Tiny right pleural effusion cannot be excluded. Electronically Signed   By: Marcello Moores  Register   On: 09/25/2020 05:45   DG Chest Port 1 View  Result Date: 09/21/2020 CLINICAL DATA:  Right-sided chest tube.  Worsening chest pain. EXAM: PORTABLE CHEST 1 VIEW COMPARISON:  Earlier today at 0800 hours. FINDINGS: Right-sided small bore chest tube is unchanged in position. Midline trachea. Normal heart size. Atherosclerosis  in the transverse aorta. A moderate amount of loculated pleural fluid may be slightly increased. No pneumothorax. No left-sided pleural fluid. Worsening right base aeration. Clear left lung. IMPRESSION: Right-sided chest tube remains in place with minimal increase in right-sided loculated pleural fluid and adjacent basilar airspace disease. Aortic Atherosclerosis (ICD10-I70.0). Electronically Signed   By: Abigail Miyamoto M.D.   On: 09/21/2020 14:01   DG Chest Port 1 View  Result Date: 09/21/2020 CLINICAL DATA:  Chest tube in place.  Pleural effusion. EXAM: PORTABLE CHEST 1 VIEW COMPARISON:  September 20, 2020 chest radiograph; chest CT September 18, 2020 FINDINGS: Chest tube position unchanged on the right. No evident pneumothorax. Loculated pleural effusion on the right again noted with much smaller free-flowing component on the  right. Left lung clear. Heart size and pulmonary vascularity normal. No adenopathy no bone lesions. IMPRESSION: Stable chest tube position on the right without evident pneumothorax. Area of loculated pleural effusion on the right persists, similar to 1 day prior with smaller free-flowing component on the right. No new opacity evident. Left lung clear. Stable cardiac silhouette. Electronically Signed   By: Lowella Grip III M.D.   On: 09/21/2020 08:52   DG CHEST PORT 1 VIEW  Result Date: 09/20/2020 CLINICAL DATA:  Right-sided chest tube. EXAM: PORTABLE CHEST 1 VIEW COMPARISON:  09/19/2020.  CT 09/18/2020. FINDINGS: Right chest tube noted over the right lower chest. Prominent rounded density, most likely loculated pleural fluid again noted over the right chest base. Tiny amount of free pleural air may be present over the right lung base at the site of chest tube insertion. No prominent pneumothorax noted. Follow-up chest x-ray suggested for continued evaluation. Calcified nodule right upper lobe most likely granuloma. Heart size stable. No acute bony abnormality. IMPRESSION: Right chest tube  noted over the right lower chest. Prominent rounded density, most likely pleural fluid again noted over the right chest base. No significant interim change in appearance. Tiny amount of free pleural air may be present over the right lung base at the site of chest tube insertion. No prominent pneumothorax noted. Follow-up chest x-ray suggested for continued evaluation. These results will be called to the ordering clinician or representative by the Radiologist Assistant, and communication documented in the PACS or [employer]. Electronically Signed   By: Marcello Moores  Register   On: 09/20/2020 12:06   DG CHEST PORT 1 VIEW  Result Date: 09/19/2020 CLINICAL DATA:  Shortness of breath, dyspnea. EXAM: PORTABLE CHEST 1 VIEW COMPARISON:  September 18, 2020. FINDINGS: The heart size and mediastinal contours are within normal limits. No pneumothorax is noted. Left lung is clear. Continued presence of loculated right pleural effusion is noted with associated right basilar atelectasis or infiltrate. The visualized skeletal structures are unremarkable. IMPRESSION: Continued presence of loculated right pleural effusion with associated right basilar atelectasis or infiltrate. Electronically Signed   By: Marijo Conception M.D.   On: 09/19/2020 08:11   DG Chest Port 1 View  Result Date: 09/18/2020 CLINICAL DATA:  Shortness of breath. EXAM: PORTABLE CHEST 1 VIEW COMPARISON:  09/16/2020. FINDINGS: Heart size normal. Right base infiltrate with moderate right pleural effusion. Calcified nodule right upper lung again noted consistent calcified granuloma. No pneumothorax. No acute bony abnormality. Splenic calcifications consistent granulomas again noted. IMPRESSION: Right base infiltrate with moderate right pleural effusion. Electronically Signed   By: Marcello Moores  Register   On: 09/18/2020 08:21   DG Swallowing Func-Speech Pathology  Result Date: 09/19/2020 Objective Swallowing Evaluation: Type of Study: MBS-Modified Barium Swallow  Study  Patient Details Name: Shavell Nored MRN: [id number] Date of Birth: [birth year] Today's Date: 09/19/2020 Time: SLP Start Time (ACUTE ONLY): 6237 -SLP Stop Time (ACUTE ONLY): 1327 SLP Time Calculation (min) (ACUTE ONLY): 21 min Past Medical History: Past Medical History: Diagnosis Date . Anxiety  . Arthritis  . Cancer [facility])   breast CA 2002 . CHF (congestive heart failure) ([place])  . COPD (chronic obstructive pulmonary disease) ([place])  . Dementia ([place])  . Hypercholesterolemia  . Hyperthyroidism  . Osteopenia  Past Surgical History: Past Surgical History: Procedure Laterality Date . ABDOMINAL HYSTERECTOMY   . APPENDECTOMY   . BACK SURGERY   . BREAST SURGERY   . MASTECTOMY W/ SENTINEL NODE BIOPSY Left 02/26/2016  Procedure: LEFT MASTECTOMY WITH  SENTINEL LYMPH NODE BIOPSY;  Surgeon: Coralie Keens, MD;  Location: [facility];  Service: General;  Laterality: Left; HPI: Pt is a 80 yo female presenting with SOB, admitted with sepsis secondary to R empyema with respiratory failure. CXR concerning for R-sided PNA with moderate pleural effusion. Family reported coughing while eating and talking. PMH includes: dementia, COPD, systolic CHF, hypothyroidism, breast ca s/p L mastectomy, recent fall  Subjective: alert, pleasant, needs repetition Assessment / Plan / Recommendation CHL IP CLINICAL IMPRESSIONS 09/19/2020 Clinical Impression Pt presents with a pharyngeal more than oral dysphagia with decreased airway protection across all liquids. She has reduced anterior hyoid movement, base of tongue retraction, and epiglottic inversion that results in airway invasion during the swallow, but also leaves increasing amounts of pharyngeal residue as boluses become thicker. Thin liquids are aspirated silently, and cannot be cleared with a cued cough. A chin tuck does NOT prevent aspiration. With nectar thick liquids she penetrates above the vocal folds under certain conditions, such as when she takes straw sips, large volumes via cup, or if her  valleculae is full of nectar-thick residue that spills over the epiglottis as she is working on more prolonged mastication of solids. This penetration clears more readily with a cued cough or throat clear, and the remaining cup sips are consumed without entering the airway. Honey thick liquids are aspirated, perhaps in part due to the posterior head tilt that she uses to access them from the cup. Recommend starting wtih Dys 2 (finely chopped) foods and nectar thick liquids by cup. Would encourage small, single sips and intermittent throat clears, although anticipate that pt will need assistance for reinforcement.  SLP Visit Diagnosis Dysphagia, oropharyngeal phase (R13.12) Attention and concentration deficit following -- Frontal lobe and executive function deficit following -- Impact on safety and function Moderate aspiration risk   CHL IP TREATMENT RECOMMENDATION 09/19/2020 Treatment Recommendations Therapy as outlined in treatment plan below   Prognosis 09/19/2020 Prognosis for Safe Diet Advancement Fair Barriers to Reach Goals Cognitive deficits Barriers/Prognosis Comment -- [facility] IP DIET RECOMMENDATION 09/19/2020 SLP Diet Recommendations Dysphagia 2 (Fine chop) solids;Nectar thick liquid Liquid Administration via Cup;No straw Medication Administration Crushed with puree Compensations Minimize environmental distractions;Slow rate;Small sips/bites;Clear throat intermittently Postural Changes Remain semi-upright after after feeds/meals (Comment);Seated upright at 90 degrees   CHL IP OTHER RECOMMENDATIONS 09/19/2020 Recommended Consults -- Oral Care Recommendations Oral care BID Other Recommendations Order thickener from pharmacy;Prohibited food (jello, ice cream, thin soups);Remove water pitcher   CHL IP FOLLOW UP RECOMMENDATIONS 09/19/2020 Follow up Recommendations Skilled Nursing facility   [facility] IP FREQUENCY AND DURATION 09/19/2020 Speech Therapy Frequency (ACUTE ONLY) min 2x/week Treatment Duration 2 weeks      CHL IP  ORAL PHASE 09/19/2020 Oral Phase Impaired Oral - Pudding Teaspoon -- Oral - Pudding Cup -- Oral - Honey Teaspoon -- Oral - Honey Cup Lingual/palatal residue Oral - Nectar Teaspoon -- Oral - Nectar Cup WFL Oral - Nectar Straw WFL Oral - Thin Teaspoon -- Oral - Thin Cup WFL Oral - Thin Straw WFL Oral - Puree WFL Oral - Mech Soft Impaired mastication;Lingual/palatal residue Oral - Regular -- Oral - Multi-Consistency -- Oral - Pill -- Oral Phase - Comment --  CHL IP PHARYNGEAL PHASE 09/19/2020 Pharyngeal Phase -- Pharyngeal- Pudding Teaspoon -- Pharyngeal -- Pharyngeal- Pudding Cup -- Pharyngeal -- Pharyngeal- Honey Teaspoon -- Pharyngeal -- Pharyngeal- Honey Cup Reduced epiglottic inversion;Reduced anterior laryngeal mobility;Reduced tongue base retraction;Pharyngeal residue - valleculae;Penetration/Aspiration during swallow;Lateral channel residue Pharyngeal Material enters airway, passes  BELOW cords then ejected out Pharyngeal- Nectar Teaspoon -- Pharyngeal -- Pharyngeal- Nectar Cup Reduced epiglottic inversion;Reduced anterior laryngeal mobility;Reduced tongue base retraction;Pharyngeal residue - valleculae;Penetration/Apiration after swallow;Penetration/Aspiration during swallow Pharyngeal Material enters airway, remains ABOVE vocal cords and not ejected out Pharyngeal- Nectar Straw Reduced epiglottic inversion;Reduced anterior laryngeal mobility;Reduced tongue base retraction;Pharyngeal residue - valleculae;Lateral channel residue;Penetration/Aspiration during swallow Pharyngeal Material enters airway, remains ABOVE vocal cords and not ejected out Pharyngeal- Thin Teaspoon -- Pharyngeal -- Pharyngeal- Thin Cup Reduced epiglottic inversion;Reduced anterior laryngeal mobility;Reduced tongue base retraction;Pharyngeal residue - valleculae;Penetration/Aspiration during swallow Pharyngeal Material enters airway, passes BELOW cords without attempt by patient to eject out (silent aspiration) Pharyngeal- Thin Straw Reduced  epiglottic inversion;Reduced anterior laryngeal mobility;Reduced tongue base retraction;Pharyngeal residue - valleculae;Compensatory strategies attempted (with notebox);Penetration/Aspiration during swallow Pharyngeal Material enters airway, passes BELOW cords without attempt by patient to eject out (silent aspiration) Pharyngeal- Puree Reduced epiglottic inversion;Reduced anterior laryngeal mobility;Reduced tongue base retraction;Pharyngeal residue - valleculae Pharyngeal -- Pharyngeal- Mechanical Soft Reduced epiglottic inversion;Reduced anterior laryngeal mobility;Reduced tongue base retraction;Pharyngeal residue - valleculae Pharyngeal -- Pharyngeal- Regular -- Pharyngeal -- Pharyngeal- Multi-consistency -- Pharyngeal -- Pharyngeal- Pill -- Pharyngeal -- Pharyngeal Comment --  CHL IP CERVICAL ESOPHAGEAL PHASE 09/19/2020 Cervical Esophageal Phase Impaired Pudding Teaspoon -- Pudding Cup -- Honey Teaspoon -- Honey Cup Reduced cricopharyngeal relaxation Nectar Teaspoon -- Nectar Cup Reduced cricopharyngeal relaxation Nectar Straw Reduced cricopharyngeal relaxation Thin Teaspoon -- Thin Cup Reduced cricopharyngeal relaxation Thin Straw Reduced cricopharyngeal relaxation Puree Reduced cricopharyngeal relaxation Mechanical Soft Reduced cricopharyngeal relaxation Regular -- Multi-consistency -- Pill -- Cervical Esophageal Comment -- Osie Bond., M.A. CCC-SLP Acute Rehabilitation Services Pager [phone] Office [phone] 09/19/2020, 3:03 PM              IR PERC PLEURAL DRAIN W/INDWELL CATH W/IMG GUIDE  Result Date: 09/20/2020 INDICATION: Loculated right pleural effusion EXAM: ULTRASOUND AND FLUOROSCOPIC 10 FRENCH RIGHT CHEST TUBE INSERTION MEDICATIONS: The patient is currently admitted to the hospital and receiving intravenous antibiotics. The antibiotics were administered within an appropriate time frame prior to the initiation of the procedure. ANESTHESIA/SEDATION: Fentanyl 25 mcg IV; Versed 5 0 mg IV Moderate  Sedation Time:  NONE. The patient was continuously monitored during the procedure by the interventional radiology nurse under my direct supervision. COMPLICATIONS: None immediate. PROCEDURE: Informed written consent was obtained from the PATIENT'S SON after a thorough discussion of the procedural risks, benefits and alternatives. All questions were addressed. Maximal Sterile Barrier Technique was utilized including caps, mask, sterile gowns, sterile gloves, sterile drape, hand hygiene and skin antiseptic. A timeout was performed prior to the initiation of the procedure. Previous imaging reviewed. Patient positioned right anterior oblique. Complex septated loculated right effusion was localized in the mid axillary line through a lower intercostal space. Under sterile conditions and local anesthesia, an 18 gauge 10 cm access needle was advanced through a lower intercostal space under direct ultrasound into the effusion. Needle position confirmed with ultrasound. Images obtained for documentation. Guidewire inserted followed by tract dilatation to insert a 10 [place] drain. Drain catheter position confirmed with ultrasound and fluoroscopy. Images obtained for documentation. Serosanguineous yellow pleural fluid aspirated. Sample sent for laboratory analysis and culture. Catheter secured with a Prolene suture and connected to external pleura vac. Sterile dressing applied. No immediate complication. Patient tolerated the procedure well. IMPRESSION: Successful ultrasound and fluoroscopic 10 French right chest tube insertion. Electronically Signed   By: Jerilynn Mages.  Shick M.D.   On: 09/20/2020 08:27     Assessment & Plan:   Pleural  effusion - Hospitalized 09/18/20-10/01/20 for acute respiratory failure and right pleural effusion s/p chest tube placement which was later discontinued. She is doing well today, no acute respiratory complaints - CXR today showed no recurrence in right sided pleural effusion, she has some chronic  changes of pleural parenchymal scarring particularly on the right   COPD (chronic obstructive pulmonary disease) (HCC) - Does not appear exacerbated today - Resume Symbicort 61mcg two puff twice daily   Nocturnal hypoxia - Oxygen was discontinued during most recent hospital stay, recommend checking overnight oximetry to ensure that she does not need nocturnal oxygen   Aspiration pneumonia ([place]) - Treated with prolonged course of antibiotics. Completed Augmentin on 10/02/20  - Speech and swallow recommended Dys 1 (pureed) solids and nectar thick liquids with full supervision to reduce risk as much as possible. Given level of dysphagia and cognitive impairments, risk may not be fully eliminated. Per palliative care notes, family is more interested in pursuing hospice care and would not want artificial feeding   Martyn Ehrich, NP 10/16/2020

## 2021-01-30 NOTE — Progress Notes (Signed)
Patient Care Team: [name], MD as PCP - General (Family Medicine) Nicholas Lose, MD as Consulting Physician (Hematology and Oncology) Delice Bison, Charlestine Massed, NP as Nurse Practitioner (Hematology and Oncology) Coralie Keens, MD as Consulting Physician (General Surgery)  DIAGNOSIS:    ICD-10-CM   1. Malignant neoplasm of lower-inner quadrant of left breast in female, estrogen receptor positive ([place])  C50.312    Z17.0       SUMMARY OF ONCOLOGIC HISTORY: Oncology History  Breast cancer of lower-inner quadrant of left female breast ([place])  01/29/1999 Initial Diagnosis   Left breast cancer treated with lumpectomy, radiation, systemic chemotherapy for 6 months, unknown stage and unknown receptor status   12/13/2015 Mammogram   Left mammogram and ultrasound: 1.6 cm oval lesion Microlobated posterior depth, measured 1.7 cm by ultrasound, T1 CN 0 stage IA clinical stage   12/27/2015 Initial Biopsy   Left breast biopsy: IDC grade 3, ER 30%, PR less than 1%, HER-2 negative, Ki-67 40%   01/29/2016 -  Anti-estrogen oral therapy   Anastrozole 1 mg daily   02/26/2016 Surgery   Left mastectomy: IDC grade 3, 2.5 cm, ER 35%, PR less than 1%, HER-2 negative ratio 1.14, Ki-67 40%, T2 N0 stage II a; ILC grade 1, 0.2 cm, ALH, margins negative, 0/1 lymph nodes, ER 95%, PR 90%, Ki-67 2%, HER-2 negative ratio 1.15, T1 1 N0 stage IA     CHIEF COMPLIANT: Follow-up of left breast cancer on anastrozole  INTERVAL HISTORY: [name] is a 80 y.o. with above-mentioned history of left breast cancer treated with mastectomy and is currently on oral antiestrogen therapy with anastrozole. She presents to the clinic today for follow-up.  She continues to suffer from dementia and does not remember taking anastrozole.  Her nursing home notes suggest that she is still taking anastrozole.  ALLERGIES:  has No Known Allergies.  MEDICATIONS:  Current Outpatient Medications  Medication Sig Dispense Refill    acetaminophen (TYLENOL) 650 MG CR tablet Take 1 tablet (650 mg total) by mouth every 12 (twelve) hours as needed for pain.     albuterol (PROVENTIL) (2.5 MG/3ML) 0.083% nebulizer solution Take 3 mLs (2.5 mg total) by nebulization every 6 (six) hours as needed for wheezing or shortness of breath.     anastrozole (ARIMIDEX) 1 MG tablet Take 1 tablet (1 mg total) by mouth daily. 90 tablet 3   atorvastatin (LIPITOR) 40 MG tablet Take 40 mg by mouth at bedtime.     budesonide-formoterol (SYMBICORT) 80-4.5 MCG/ACT inhaler Inhale 2 puffs into the lungs 2 (two) times daily.     Calcium Citrate-Vitamin D (CALCIUM CITRATE PETITE/VIT D PO) Take 1 tablet by mouth in the morning.     carvedilol (COREG) 3.125 MG tablet Take 3.125 mg by mouth 2 (two) times daily with a meal.     Cholecalciferol (VITAMIN D3) 50 MCG (2000 UT) TABS Take 2,000 Units by mouth in the morning.     docusate sodium (COLACE) 100 MG capsule Take 1 capsule (100 mg total) by mouth 2 (two) times daily.     famotidine (PEPCID) 40 MG tablet Take 40 mg by mouth at bedtime.     furosemide (LASIX) 20 MG tablet Take 20 mg by mouth in the morning.     gabapentin (NEURONTIN) 100 MG capsule Take 200 mg by mouth 2 (two) times daily.      levothyroxine (SYNTHROID, LEVOTHROID) 25 MCG tablet Take 25 mcg by mouth daily before breakfast.      Maltodextrin-Xanthan Gum (  RESOURCE THICKENUP CLEAR) POWD Oral, As needed, other     memantine (NAMENDA) 10 MG tablet Take 1 tablet (10 mg total) by mouth 2 (two) times daily. 60 tablet 12   mirabegron ER (MYRBETRIQ) 25 MG TB24 tablet Take 25 mg by mouth daily.     Multiple Vitamin (MULTIVITAMIN WITH MINERALS) TABS tablet Take 1 tablet by mouth daily.     polyethylene glycol (MIRALAX / GLYCOLAX) 17 g packet Take 17 g by mouth daily.     potassium chloride SA (K-DUR,KLOR-CON) 20 MEQ tablet Take 20 mEq by mouth daily.      venlafaxine XR (EFFEXOR-XR) 150 MG 24 hr capsule Take 150 mg by mouth daily with breakfast.     No  current facility-administered medications for this visit.    PHYSICAL EXAMINATION: ECOG PERFORMANCE STATUS: 1 - Symptomatic but completely ambulatory  Vitals:   01/31/21 1011  BP: 98/70  Pulse: 89  Resp: (!) 95  Temp: 97.8 F (36.6 C)  SpO2: 95%   Filed Weights   01/31/21 1011  Weight: 104 lb 1.6 oz (47.2 kg)    BREAST: Left mastectomy without any palpable lumps or nodules.  Right breast no lumps or nodules (exam performed in the presence of a chaperone)  LABORATORY DATA:  I have reviewed the data as listed CMP Latest Ref Rng & Units 09/29/2020 09/28/2020 09/26/2020  Glucose 70 - 99 mg/dL 121(H) 75 51(L)  BUN 8 - 23 mg/dL _0 Creatinine 0.44 - 1.00 mg/dL 0.71 0.80 1.00  Sodium 135 - 145 mmol/L 137 136 137  Potassium 3.5 - 5.1 mmol/L 3.9 3.3(L) 3.6  Chloride 98 - 111 mmol/L 103 98 101  CO2 22 - 32 mmol/L 27 32 28  Calcium 8.9 - 10.3 mg/dL 8.0(L) 8.0(L) 8.1(L)  Total Protein 6.5 - 8.1 g/dL - - -  Total Bilirubin 0.3 - 1.2 mg/dL - - -  Alkaline Phos 38 - 126 U/L - - -  AST 15 - 41 U/L - - -  ALT 0 - 44 U/L - - -    Lab Results  Component Value Date   WBC 16.3 (H) 09/29/2020   HGB 10.7 (L) 09/29/2020   HCT 33.6 (L) 09/29/2020   MCV 93.3 09/29/2020   PLT 332 09/29/2020   NEUTROABS 13.6 (H) 09/27/2020    ASSESSMENT & PLAN:  Breast cancer of lower-inner quadrant of left female breast ([place]) Left mastectomy 02/26/2016:  IDC grade 3, 2.5 cm, ER 35%, PR less than 1%, HER-2 negative ratio 1.14, Ki-67 40%, T2 N0 stage II a;  ILC grade 1, 0.2 cm, ALH, marg neg, 0/1 lymph nodes, ER 95%, PR 90%, Ki-67 2%, HER-2 neg ratio 1.15, T1a N0 stage IA    Treatment plan: Adjuvant antiestrogen therapy with Anastrozole 1 mg daily 5 years (started neoadjuvantly 01/29/2016)   Anastrozole toxicities: Patient did not have any hot flashes or myalgias. on Fosamax and calcium and vitamin D.  She has dementia.   Hospitalization 09/18/2020-10/01/2020: Loculated right pleural effusion with  hypoxia, chest tube drainage  Surveillance:  Mammogram 01/24/2020 at [facility].:  Benign breast density category C Breast exam 01/31/2021: Benign Return to clinic in 1 year for follow-up    No orders of the defined types were placed in this encounter.  The patient has a good understanding of the overall plan. she agrees with it. she will call with any problems that may develop before the next visit here.  Total time spent: 20 mins including face to face  time and time spent for planning, charting and coordination of care  Rulon Eisenmenger, MD, MPH 01/31/2021  I, Thana Ates, am acting as scribe for Dr. Nicholas Lose.  I have reviewed the above documentation for accuracy and completeness, and I agree with the above.

## 2021-01-31 ENCOUNTER — Other Ambulatory Visit: Payer: Self-pay

## 2021-01-31 ENCOUNTER — Inpatient Hospital Stay: Payer: Medicare Other | Attending: Hematology and Oncology | Admitting: Hematology and Oncology

## 2021-01-31 DIAGNOSIS — Z9012 Acquired absence of left breast and nipple: Secondary | ICD-10-CM | POA: Diagnosis not present

## 2021-01-31 DIAGNOSIS — F0390 Unspecified dementia without behavioral disturbance: Secondary | ICD-10-CM | POA: Diagnosis not present

## 2021-01-31 DIAGNOSIS — Z79811 Long term (current) use of aromatase inhibitors: Secondary | ICD-10-CM | POA: Diagnosis not present

## 2021-01-31 DIAGNOSIS — Z7982 Long term (current) use of aspirin: Secondary | ICD-10-CM | POA: Diagnosis not present

## 2021-01-31 DIAGNOSIS — Z79899 Other long term (current) drug therapy: Secondary | ICD-10-CM | POA: Insufficient documentation

## 2021-01-31 DIAGNOSIS — C50312 Malignant neoplasm of lower-inner quadrant of left female breast: Secondary | ICD-10-CM | POA: Insufficient documentation

## 2021-01-31 DIAGNOSIS — Z170 Estrogen receptor positive status [ER+]: Secondary | ICD-10-CM | POA: Insufficient documentation

## 2021-01-31 NOTE — Assessment & Plan Note (Signed)
Left mastectomy 02/26/2016:  IDCgrade 3, 2.5 cm, ER 35%, PR less than 1%, HER-2 negative ratio 1.14, Ki-67 40%, T2 N0 stage II a;  ILCgrade 1, 0.2 cm, ALH, marg neg, 0/1 lymph nodes, ER 95%, PR 90%, Ki-67 2%, HER-2 neg ratio 1.15, T1aN0 stage IA  Treatment plan: Adjuvant antiestrogen therapy with Anastrozole 1 mg daily 5 years (started neoadjuvantly09/09/2015)  Anastrozole toxicities: Patient did not have any hot flashes or myalgias. on Fosamax and calcium and vitamin D.  She has dementia.  Hospitalization 09/18/2020-10/01/2020: Loculated right pleural effusion with hypoxia, chest tube drainage  Surveillance:  Mamm[facility]/24/2020 at [facility]  Benign breast density category C Breast exam 01/31/2021: Benign Return to clinic in1 yearfor follow-up

## 2022-01-24 IMAGING — DX DG CHEST 2V
2 series · 2 of 2 positions shown · non-contrast
Comparison: September 21, 2020.

CLINICAL DATA: Recent pleural effusion. History of breast carcinoma

EXAM:
CHEST - 2 VIEW

[chest lat]
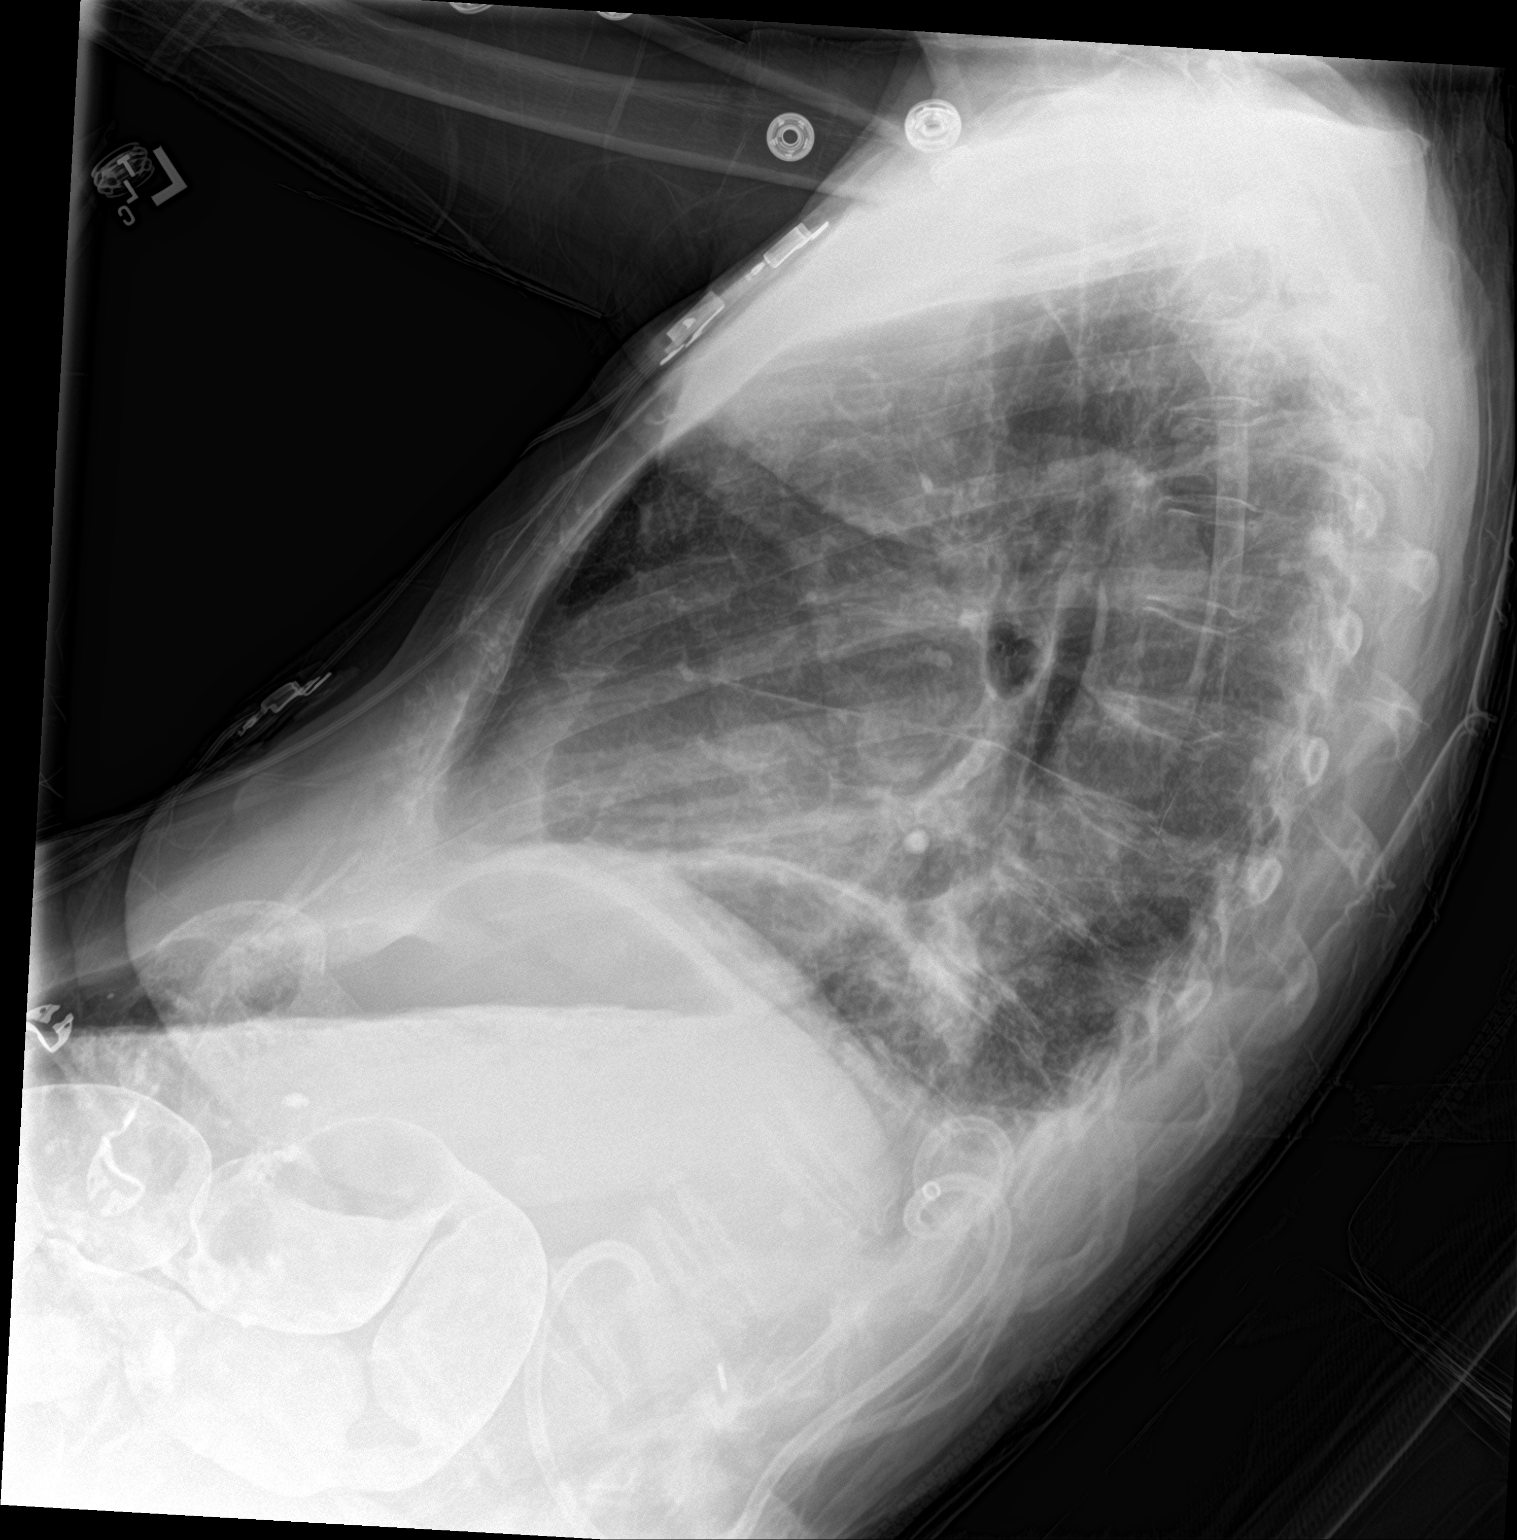

[chest ap]
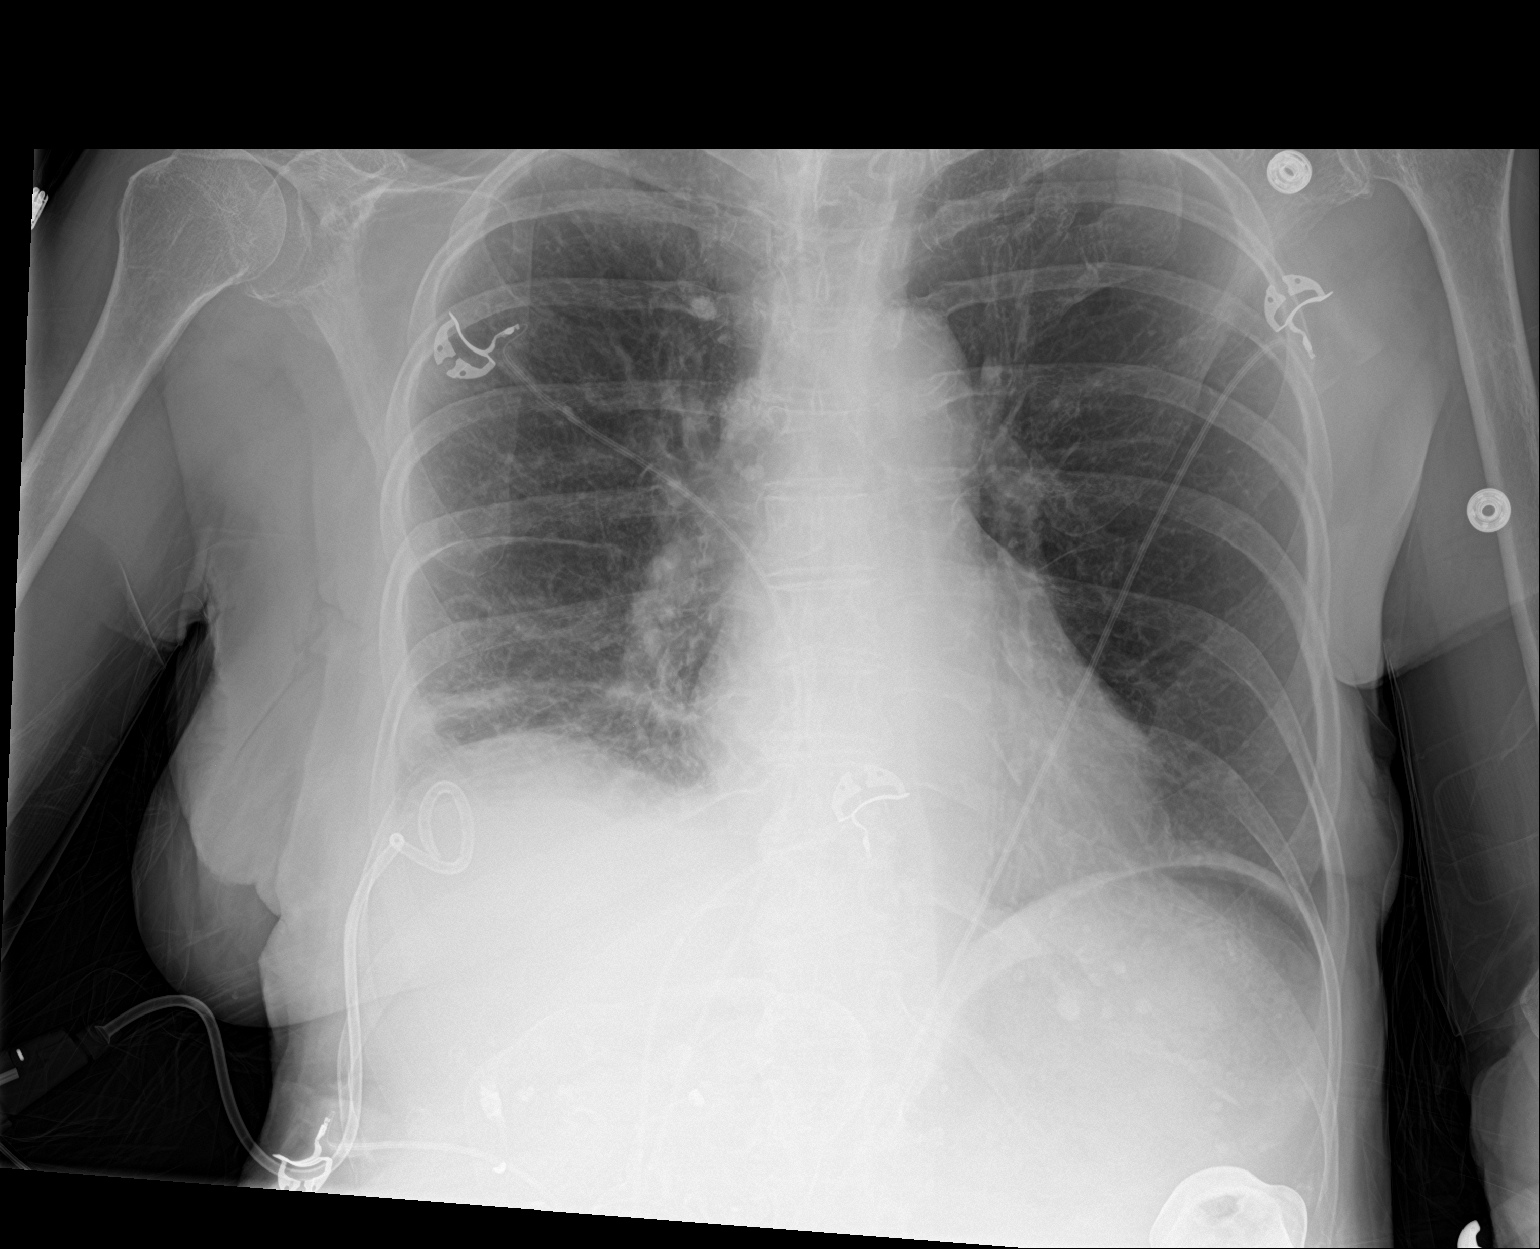

[2 of 2 positions shown; findings below may reference images not displayed]

FINDINGS: PleurX catheter appears more peripherally position in the inferior
lateral right base region compared to recent study. No pneumothorax.
The previously noted apparent loculated effusion on the right has
largely resolved. A small amount of residual pleural effusion on the
right with right base atelectasis is noted. There is a stable
calcified granuloma in the medial right upper lobe. Lungs elsewhere
are clear. Heart size and pulmonary vascularity are normal. No
adenopathy. No bone lesions.
IMPRESSION: PleurX catheter as described. Right pleural effusion much smaller
with small residual right pleural effusion and right base
atelectasis. No pneumothorax. Stable calcified granuloma medial
right upper lobe. Lungs elsewhere clear. Heart size normal.

## 2022-01-25 IMAGING — DX DG CHEST 1V PORT
1 series · 1 of 1 positions shown · non-contrast
Comparison: Single-view of the chest earlier today.

CLINICAL DATA: Status post chest tube removal today.

EXAM:
PORTABLE CHEST 1 VIEW

[chest]
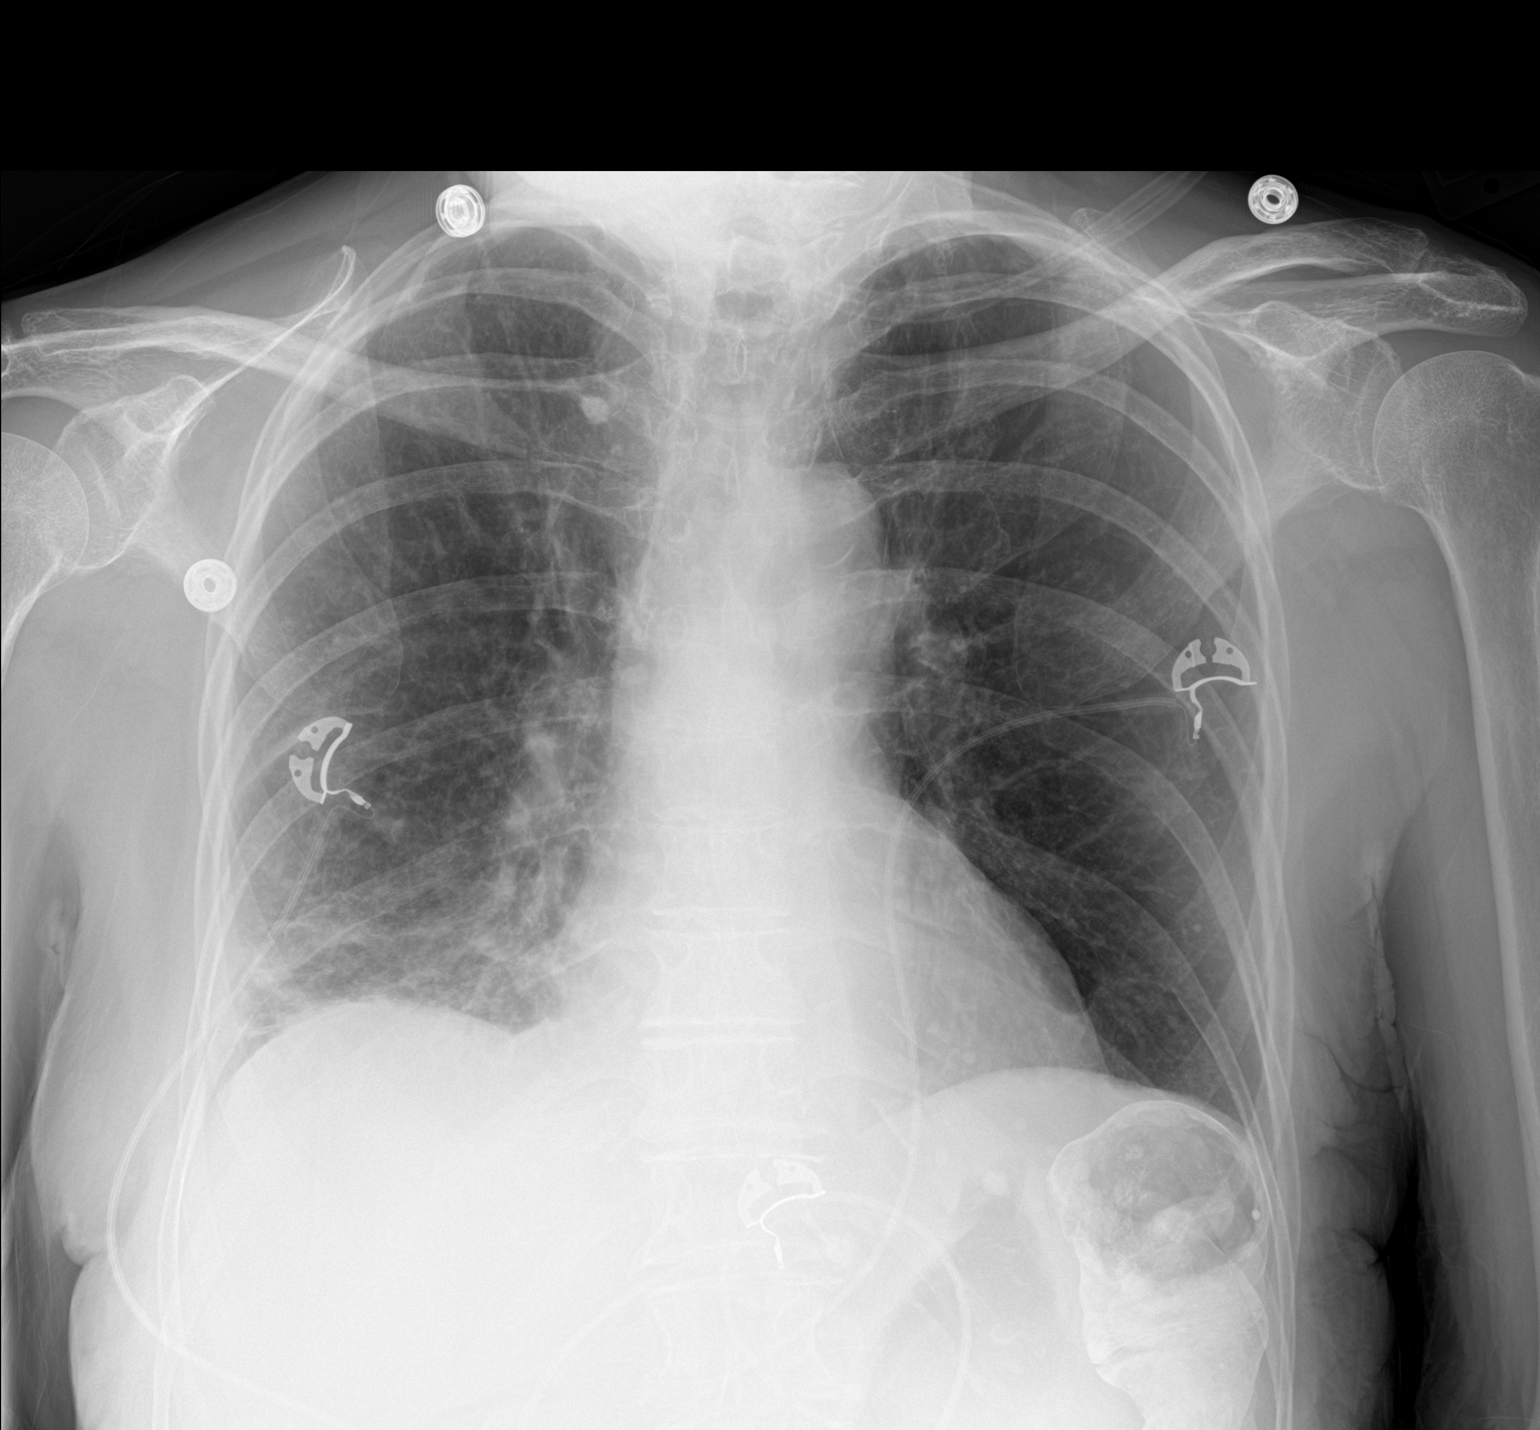

[1 of 1 positions shown; findings below may reference images not displayed]

FINDINGS: Pigtail catheter in the right chest has been removed. No
pneumothorax. Mild right basilar opacities are unchanged. The left
lung is expanded and clear. Heart size is normal. Aortic
atherosclerosis.
IMPRESSION: Negative for pneumothorax after right chest tube removal.

## 2022-01-25 IMAGING — DX DG CHEST 1V PORT
1 series · 1 of 1 positions shown · non-contrast
Comparison: No prior.

CLINICAL DATA: Chest tube removal.

EXAM:
PORTABLE CHEST 1 VIEW

[chest]
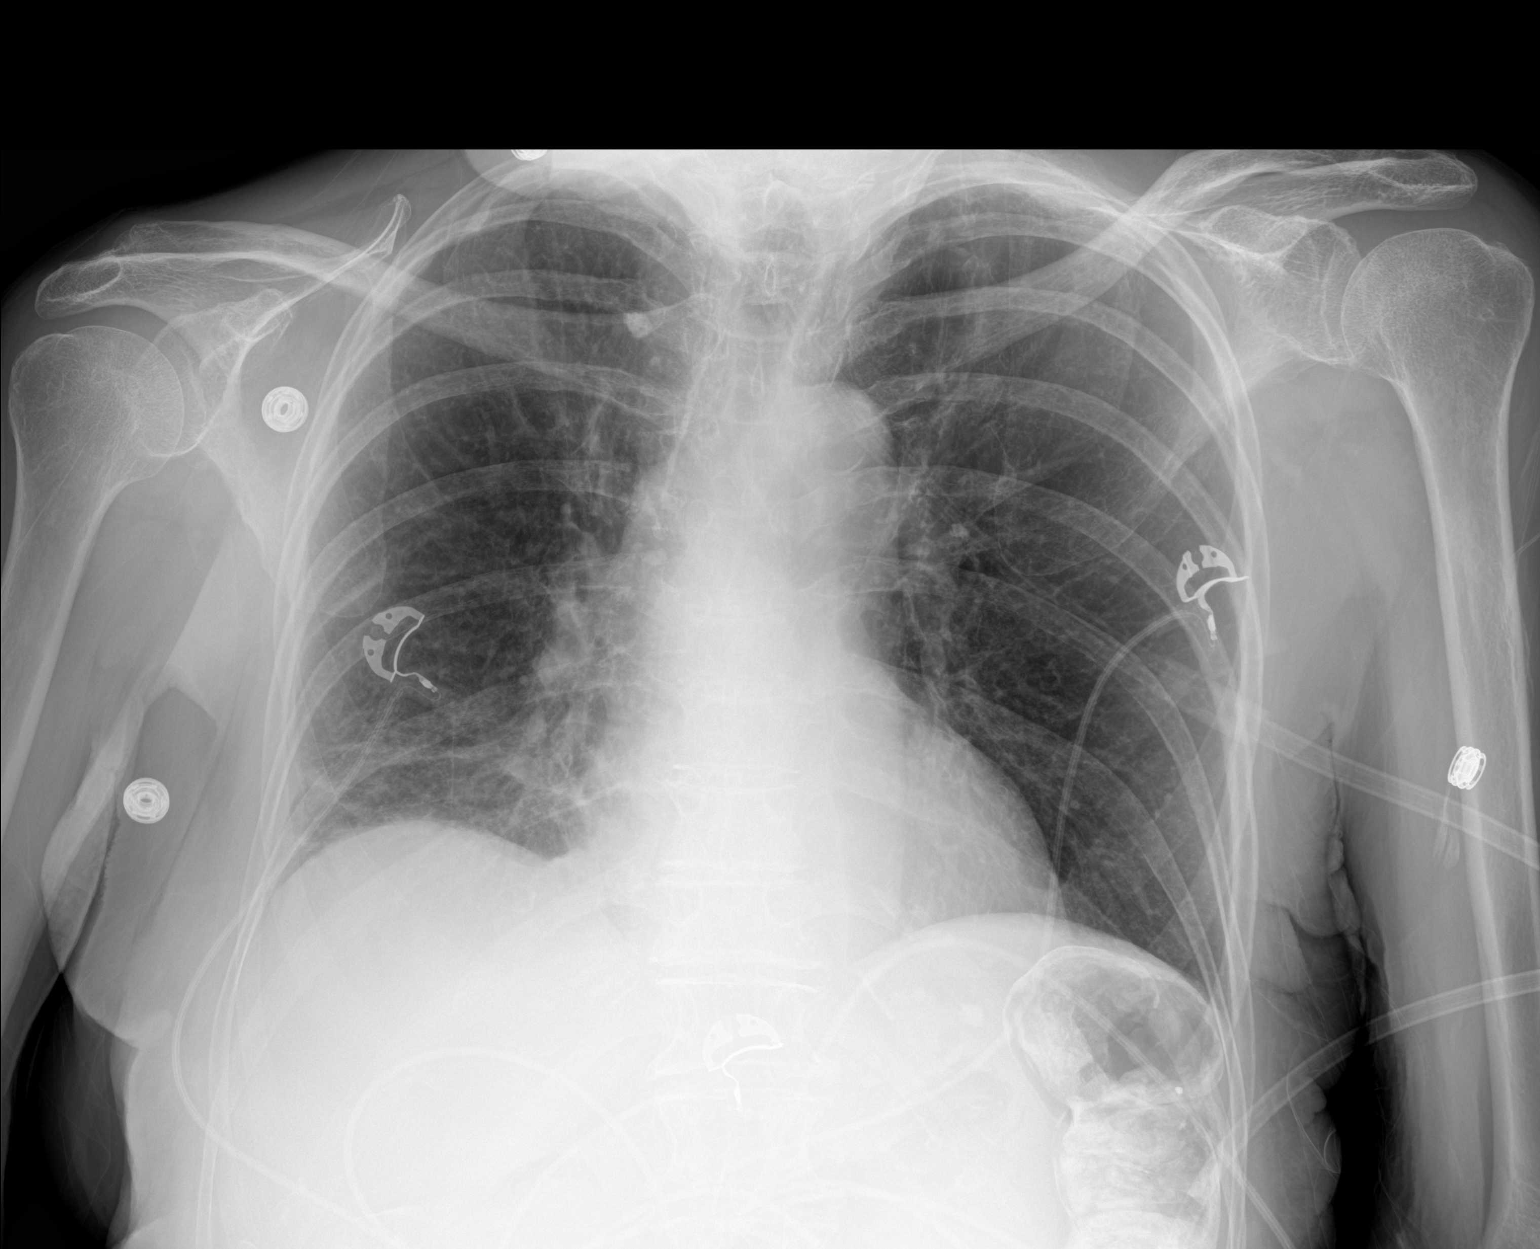

[1 of 1 positions shown; findings below may reference images not displayed]

FINDINGS: Mediastinum and hilar structures normal. Mild atelectasis/infiltrate
right lung base. No pleural effusion or pneumothorax. Oral contrast
in the colon. Heart size normal. Degenerative changes scoliosis
thoracic spine.
IMPRESSION: Mild atelectasis right lung base.  No evidence of pneumothorax.

## 2022-01-25 IMAGING — DX DG CHEST 1V PORT
1 series · 1 of 1 positions shown · non-contrast
Comparison: 09/24/2020.

CLINICAL DATA: Pleural effusion.  Chest tube.

EXAM:
PORTABLE CHEST 1 VIEW

[chest ap]
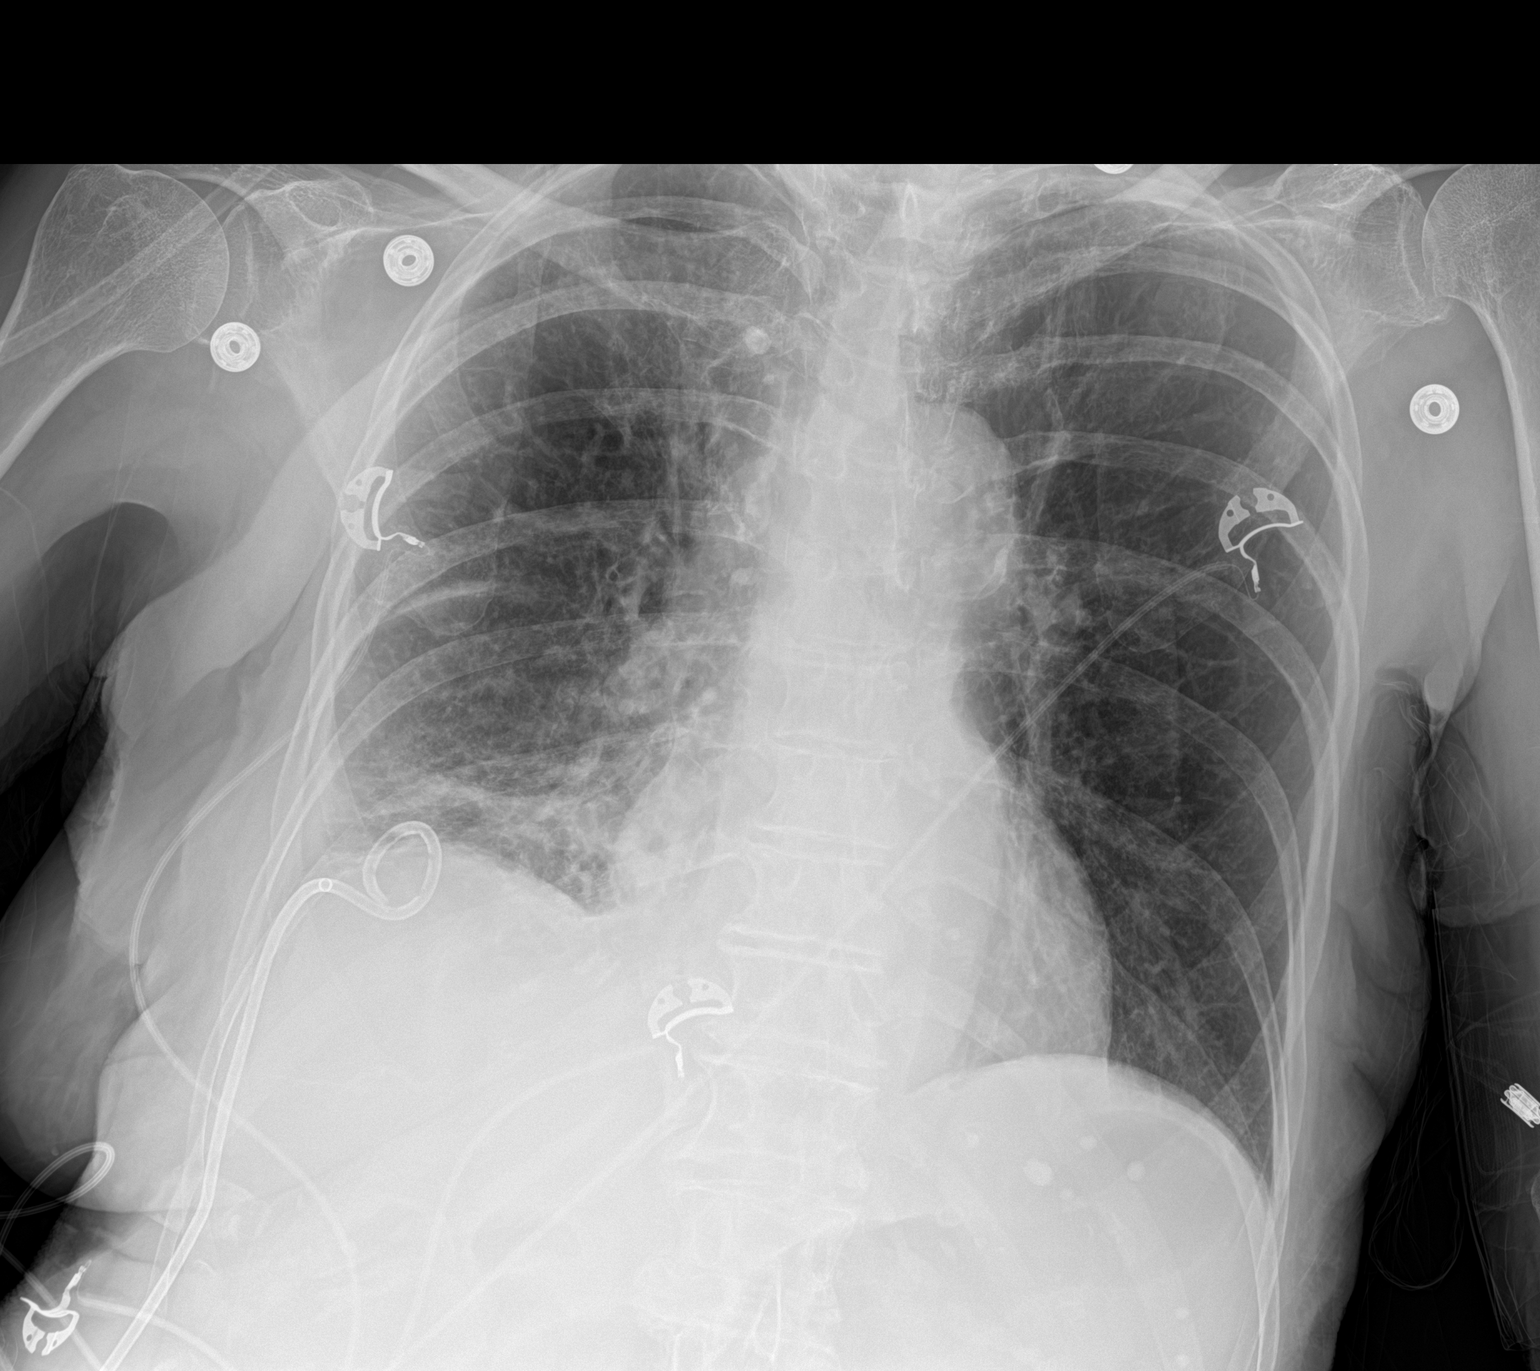

[1 of 1 positions shown; findings below may reference images not displayed]

FINDINGS: Right chest tube in stable position. No pneumothorax. Mild right
base atelectasis again noted. Stable calcification left upper lung
most consistent granuloma. Tiny right pleural effusion cannot be
excluded. Heart size normal. Mild right chest wall subcutaneous
emphysema.
IMPRESSION: Right chest tube in stable position. No pneumothorax. Mild right
base atelectasis again noted. Tiny right pleural effusion cannot be
excluded.

## 2022-01-31 ENCOUNTER — Ambulatory Visit: Payer: Medicare Other | Admitting: Hematology and Oncology

## 2022-02-14 ENCOUNTER — Inpatient Hospital Stay: Payer: Medicare Other | Attending: Hematology and Oncology | Admitting: Hematology and Oncology

## 2022-02-14 NOTE — Assessment & Plan Note (Deleted)
Left mastectomy 02/26/2016:  IDCgrade 3, 2.5 cm, ER 35%, PR less than 1%, HER-2 negative ratio 1.14, Ki-67 40%, T2 N0 stage II a;  ILCgrade 1, 0.2 cm, ALH, marg neg, 0/1 lymph nodes, ER 95%, PR 90%, Ki-67 2%, HER-2 neg ratio 1.15, T1aN0 stage IA  Treatment plan: Adjuvant antiestrogen therapy with Anastrozole 1 mg daily (started neoadjuvantly09/09/2015)  Anastrozole toxicities: Patient did not have any hot flashes or myalgias. on Fosamax and calcium and vitamin D.  She has dementia.  Hospitalization 09/18/2020-10/01/2020: Loculated right pleural effusion with hypoxia, chest tube drainage  Surveillance:  [facility]  Benign breast density category C  Breast exam 02/14/2022: Benign  Return to clinic in1 yearfor follow-up

## 2022-05-26 DEATH — deceased
